# Patient Record
Sex: Male | Born: 1935 | Race: White | Hispanic: No | Marital: Married | Smoking: Never smoker
Health system: Southern US, Community
[De-identification: ages and names within clinical notes are randomized; demographics above are authoritative.]

## PROBLEM LIST (undated history)

## (undated) DIAGNOSIS — M419 Scoliosis, unspecified: Secondary | ICD-10-CM

## (undated) DIAGNOSIS — J449 Chronic obstructive pulmonary disease, unspecified: Secondary | ICD-10-CM

## (undated) DIAGNOSIS — C4490 Unspecified malignant neoplasm of skin, unspecified: Secondary | ICD-10-CM

## (undated) DIAGNOSIS — E039 Hypothyroidism, unspecified: Secondary | ICD-10-CM

## (undated) DIAGNOSIS — Z8719 Personal history of other diseases of the digestive system: Secondary | ICD-10-CM

## (undated) DIAGNOSIS — K5730 Diverticulosis of large intestine without perforation or abscess without bleeding: Secondary | ICD-10-CM

## (undated) DIAGNOSIS — R7302 Impaired glucose tolerance (oral): Secondary | ICD-10-CM

## (undated) DIAGNOSIS — Z9889 Other specified postprocedural states: Secondary | ICD-10-CM

## (undated) DIAGNOSIS — R972 Elevated prostate specific antigen [PSA]: Secondary | ICD-10-CM

## (undated) DIAGNOSIS — Z9049 Acquired absence of other specified parts of digestive tract: Secondary | ICD-10-CM

## (undated) HISTORY — DX: Unspecified malignant neoplasm of skin, unspecified: C44.90

## (undated) HISTORY — DX: Acquired absence of other specified parts of digestive tract: Z90.49

## (undated) HISTORY — PX: COLON RESECTION: SHX5231

## (undated) HISTORY — DX: Diverticulosis of large intestine without perforation or abscess without bleeding: K57.30

## (undated) HISTORY — DX: Scoliosis, unspecified: M41.9

## (undated) HISTORY — PX: SHOULDER SURGERY: SHX246

## (undated) HISTORY — DX: Impaired glucose tolerance (oral): R73.02

## (undated) HISTORY — DX: Chronic obstructive pulmonary disease, unspecified: J44.9

## (undated) HISTORY — DX: Other specified postprocedural states: Z98.890

## (undated) HISTORY — DX: Personal history of other diseases of the digestive system: Z87.19

## (undated) HISTORY — DX: Elevated prostate specific antigen (PSA): R97.20

---
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
## 2001-02-06 ENCOUNTER — Ambulatory Visit (HOSPITAL_COMMUNITY): Admission: RE | Admit: 2001-02-06 | Discharge: 2001-02-06 | Payer: Self-pay | Admitting: Gastroenterology

## 2001-04-19 ENCOUNTER — Encounter: Admission: RE | Admit: 2001-04-19 | Discharge: 2001-04-19 | Payer: Self-pay | Admitting: Orthopedic Surgery

## 2001-04-19 ENCOUNTER — Encounter: Payer: Self-pay | Admitting: Orthopedic Surgery

## 2001-04-24 ENCOUNTER — Ambulatory Visit (HOSPITAL_BASED_OUTPATIENT_CLINIC_OR_DEPARTMENT_OTHER): Admission: RE | Admit: 2001-04-24 | Discharge: 2001-04-25 | Payer: Self-pay | Admitting: Orthopedic Surgery

## 2005-01-04 ENCOUNTER — Encounter: Admission: RE | Admit: 2005-01-04 | Discharge: 2005-01-04 | Payer: Self-pay | Admitting: Geriatric Medicine

## 2005-01-18 ENCOUNTER — Encounter: Admission: RE | Admit: 2005-01-18 | Discharge: 2005-01-18 | Payer: Self-pay | Admitting: Surgery

## 2007-09-06 ENCOUNTER — Encounter (INDEPENDENT_AMBULATORY_CARE_PROVIDER_SITE_OTHER): Payer: Self-pay | Admitting: General Surgery

## 2007-09-06 ENCOUNTER — Inpatient Hospital Stay (HOSPITAL_COMMUNITY): Admission: EM | Admit: 2007-09-06 | Discharge: 2007-09-13 | Payer: Self-pay | Admitting: Emergency Medicine

## 2010-08-26 ENCOUNTER — Other Ambulatory Visit: Payer: Self-pay | Admitting: Dermatology

## 2010-11-02 NOTE — H&P (Signed)
NAME:  BRAXTYN, BOJARSKI NO.:  [id number]   MEDICAL RECORD NO.:  [id number]          PATIENT TYPE:  EMS   LOCATION:  MAJO                         FACILITY:  [facility]   PHYSICIAN:  Lennie Muckle, MD      DATE OF BIRTH:  September 01, 1925   DATE OF ADMISSION:  09/06/2007  DATE OF DISCHARGE:                              HISTORY & PHYSICAL   ADMITTING PHYSICIAN:  Lennie Muckle, M.D.   CHIEF COMPLAINT:  Lower abdominal pain.   HISTORY OF PRESENT ILLNESS:  Mr. Ketcham is a 77 year old relatively  healthy male patient.  Only significant medical problems recently have  been over the past __________ months he has been treated for recurrent  skin cancers, he described as being squamous cell, and has had several  excisions for this.  He also has a history of right inguinal hernia  repair and appendectomy.  He developed abrupt onset of lower abdominal  pain around 4:00 a.m. this morning, which awakened him from sleep.  His  pain was very severe, was later associated with persistent nausea and  vomiting and lower abdominal distention.  He had not passed a BM or  flatus since the onset of the symptoms.  Last BM or flatus was yesterday  prior to going to bed.  He finally presented to the ER, where workup  revealed a normal white count but a left shift.  Other electrolytes were  relatively within normal limits.  A CT of the abdomen and pelvis with  oral and IV contrast revealed a closed loop small bowel obstruction,  possibly some internal hernia, although this was not easily identified.  This could also be related to an adhesion.  Dr. Ethelda Chick requested  surgical evaluation for admission.   REVIEW OF SYSTEMS:  As per the history of present illness.  The patient  denies issues with chronic abdominal pain, nausea, vomiting, bloating.  No upper or lower bleeding symptoms.  No dark stools.  No difficulty  with passing stools.  No hemorrhoids or other issues.  Otherwise review  of systems  is noncontributory.   PAST MEDICAL HISTORY:  Skin cancer and constipation.   PAST SURGICAL HISTORY:  Excision of skin cancers x4, mainly face and leg  region, colonoscopy in 2002, right inguinal hernia repair, appendectomy  60 years ago and rotator cuff repair on the right.   FAMILY HISTORY:  Noncontributory.   SOCIAL HISTORY:  He is married.  His wife is at the bedside with him.  No alcohol.  No tobacco.   DRUG ALLERGIES:  NKDA.   CURRENT MEDICATIONS:  Milk of Magnesia 30 mL at hour of sleep to aid  daily BM.   PHYSICAL EXAMINATION:  GENERAL:  Pleasant but acutely ill-appearing male  complaining of diffuse lower abdominal pain, lying on his right side for  comfort reasons.  VITAL SIGNS:  Temperature 97.5, BP 148/92, pulse 60, respirations 18.  PSYCH:  The patient is alert and oriented x3.  His affect is appropriate  for current situation.  NEURO:  The patient is moving all extremities x4.  Cranial nerves II-XII  are grossly  intact.  No obvious focal neurological deficits.  Sensation  is grossly intact in the upper and lower extremities.  HEENT:  Sclerae  are noninjected, nonicteric.  Conjunctivae pink.  Pupils are equal and  reactive to light.  Ears, nose and throat:  Oral mucous membranes are  dry.  Ears are symmetrical in appearance.  No obvious lesions noted.  Posterior pharynx without exudate.  NECK:  Supple.  No appreciable adenopathy.  Thyroid down,  nonpalpable.  Trachea midline.  CHEST:  Bilateral lung sounds are clear to auscultation anteriorly.  Respiratory effort is nonlabored.  He is on room air satting 98%.  CARDIAC:  Heart sounds are S1 and S2.  No obvious rubs, murmurs, thrills  or gallops.  Not tachycardiac.  No JVD.  No peripheral edema.  No  carotid bruits were auscultated.  Pulses:  Carotid, radial and pedal  were 2+ bilaterally.  ABDOMEN:  Soft.  Bowel sounds are absent.  He is markedly distended in  the lower abdomen and diffusely tender in the same  region.  He also  complains of tenderness when you rotate him to either side in the upper  quadrant, but the majority of the pain is in the lower quadrants.  There  is no definite single focal area of pain in the abdomen and the lower  area is tender without guarding or rebounding.  No hernias were noted on  the abdominal wall or in the inguinal areas from prior surgical  excision.  The right lower quadrant incision is intact, well-healed and  no hernia there as well.  No obvious masses, no obvious  hepatosplenomegaly.  No bruits were noted.  EXTREMITIES:  Symmetrical in  appearance without cyanosis or clubbing.  SKIN:  Multiple seborrheic keratoses, age appropriate.  He does have a  linear vertical scar at the right nasolabial fold just above the lip,  well-healed.   LABORATORIES:  Sodium 138, potassium 3.8, CO2 25, glucose 139, BUN 17,  creatinine 1.10, lipase 14.  White count 9,300, neutrophils 83%,  hemoglobin 16.5, platelets 139,000.  Diagnostic CT of the abdomen and  pelvis as noted in history present illness.  I have also reviewed the CT  with Dr. Deanne Coffer in radiology.  As noted, there is a closed loop small  bowel obstruction.  No definite etiology.  It could be an internal loop  versus adhesion from prior appendectomy procedure.  He also had some  unusual thickening of the small bowel wall and significant mesenteric  edema.  This could be a sign of early ischemia.   IMPRESSION:  1. Closed loop small bowel obstruction in the right abdomen.      Uncertain etiology.  Internal hernia versus adhesion versus other.  2. Polycythemia secondary to hypovolemia/dehydration.   PLAN:  Admit the patient to general floor for bowel rest, NG tube and  n.p.o. status.  IV fluids.  The patient will receive a dose of Unasyn in  the ER.  Need to review the CT and the patient's case with Dr. Freida Busman.  Uncertain if we need to proceed with bowel rest to see if symptoms  improve and follow with x-rays  and follow clinically versus go ahead and  take the patient to the OR this afternoon.      Allison L. Kennith Center, MD  Electronically Signed    ALE/MEDQ  D:  09/06/2007  T:  09/06/2007  Job:  [id number]   cc:   Aram Beecham  Lucilla Lame, M.D.

## 2010-11-02 NOTE — Discharge Summary (Signed)
NAME:  Jason Barton, Jason Barton NO.:  [id number]   MEDICAL RECORD NO.:  [id number]          PATIENT TYPE:  INP   LOCATION:  5150                         FACILITY:  [facility]   PHYSICIAN:  Currie Paris, M.D.DATE OF BIRTH:  1926/01/10   DATE OF ADMISSION:  09/06/2007  DATE OF DISCHARGE:  09/13/2007                               DISCHARGE SUMMARY   ADMITTING PHYSICIAN:  Amber L. Freida Busman, MD.   PRIMARY CARE PHYSICIAN:  Stacie Acres. White, MD.   CHIEF COMPLAINT/REASON FOR ADMISSION:  Jason Barton is a 77 year old  male patient, only other past medical history has been removal of  several squamous cell carcinomas.  He also has had a prior surgical  history of right inguinal hernia repair and an appendectomy.  He  developed abrupt onset of lower abdominal pain, around 4 a.m. in the  morning of admission, this awakened him from sleep, the pain was very  severe.  Later, the patient developed persistent nausea, vomiting, and  lower abdominal distention.  No BM or flatus since the onset of  symptoms.  The patient presented to the ER where he was evaluated by Dr.  Ethelda Chick, and a CT scan was done that revealed a closed loop small  bowel obstruction, either an internal hernia or possibly adhesions and a  small bowel wall thickening and edema consistent with possible early  ischemia.   On exam, the patient did have a distended abdomen mainly in the lower  abdomen, remarkably distended and diffusely tender and a pain with  movement, especially in the lower quadrant.  No bowel sounds were  auscultated.  No hernias.  The patient's white count was 9300 with a  left shift and neutrophils 83%.  The patient's hemoglobin was somewhat  elevated at 16.5 consistent with probable volume depletion and his  platelets were 139,000.  CT of the abdomen and pelvis was reviewed with  Dr. Deanne Coffer, who is a radiologist, and it was agreed that the patient  had a closed small bowel loop obstruction  certain, etiology unknown, but  definite findings consistent with possible evolving ischemic problems,  focally at the level of the obstruction.   The patient was admitted with the following diagnoses.  1. Closed loop small bowel obstruction, right abdomen, etiology      uncertain, associated ischemic changes on CT.  2. Polycythemia due to hypervolemia, dehydration, and recent  nausea      and vomiting.  3. History of prior abdominal surgery as mentioned.  4. History of prior squamous cell carcinoma removal.   HOSPITAL COURSE:  The patient was admitted and taken directly from the  ER to the operating room by Dr. Freida Busman where he underwent exploratory  laparotomy, where he was found to have small bowel ischemia and a band  like intestinal adhesion.  He underwent the laparotomy for the small  bowel resection and lysis of adhesions, 60 cm of his distal ileum was  necrotic and removed, and primary anastomosis was achieved.  Please  refer to Dr. Eliot Ford operative note for details.  The patient was quite  stable during this procedure, returned subsequently  to PACU, and then to  the surgical floor for the remainder of the hospitalization.   The patient had a normal hospital course.  He did have problems with the  postoperative ileus, so the NG tube was left in place up until  postoperative day #5, at which he was started on a diet and advanced up  to full liquids until postoperative day 6.  He had not had a BM yet but  had tolerated diet without nausea and vomiting.  Dulcolax suppository  was given as well as a dose of MiraLax, and the patient had a massive BM  with marked improvement in his symptoms, and was subsequently started on  a solid diet on postoperative day 6.  On postoperative day 7, the  patient was otherwise deemed appropriate for discharge to home.  He was  tolerating a solid diet.  His incisional wound was well approximated.  He did have some redness at the upper portion of the  incision without  any induration or drainage, and plans were to remove the staples and  apply Steri-Strips prior to discharge.  Pain was adequately controlled  with oral medications.   The patient reports he has a history of unusual dermatological eruptions  in the past involving very large splotchy, circular breakouts as well as  maculopapular rashes.  He had first episodes of these in the  postoperative period.  He had an episode of splotchy breakout on the  anterior chest, neck, and a very large area on the left shoulder that  appeared more consistent with a burn, although the patient had not had  any treatment performed to him during the hospitalization that would  result in a burn.  The area on the left shoulder actually did erupt in  several vesicles, with the most large vesicle rupturing spontaneously  and remaining dry.  In addition, the patient developed a diffuse  maculopapular rash over his entire back with significant pruritus.  After taking a bath, this rash seemed to extend to his abdomen and legs  that were subsequently cleared.  He was given a solitary dose of Solu-  Medrol IV and started on Benadryl p.o. and later started on a topical  cortisone cream to the back for symptoms.  The patient reports since he  has had these in the past and he had been followed by Dr. Nicholas Lose, he  plans on following up with her after discharge.  I was uncertain if  Unasyn versus physiological stress brought about these conditions, but  the Unasyn was discontinued.   FINAL DISCHARGE DIAGNOSES:  1. Small bowel obstruction with ischemic small bowel secondary to band      adhesions.  2. Status post exploratory laparotomy, lysis of adhesion, and small      bowel resection, 60-cm distal ileum with primary anastomosis.  3. Postop ileus, resolved.  4. Rashes as described, recurrent.  5. History of constipation, on milk of magnesia at home.   DISCHARGE MEDICATIONS:  1. The patient was taking milk  of magnesia at home nightly to help      with aid in BMs.  We have stopped this and instead we have      recommended the patient begin Colace 100 mg b.i.d. as well as      MiraLax 17 g in 8 ounces of fluid daily at bedtime.  The patient      has been instructed on how to adjust the dosage on the MiraLax  based on frequency and consistency of stools.  2. Discontinue the cortisone cream as previously directed here at the      hospital.  3. Vicodin 5/325, one to two tabs every 4 hours as needed for pain.  4. Over-the-counter Ibuprofen, 200-mg strength, 1-2 tablets every 8      hours as needed for pain if not controlled by the Vicodin.   RETURN TO WORK:  Not applicable.   DIET:  No restrictions.   WOUND CARE:  Allow Steri-Strips to fall off.   ACTIVITY:  Increase activity slowly, and may walk  up steps, may shower.  No lifting for the next 5 weeks more than 15 pounds.  No driving for 2  weeks or until he follows with Dr. Freida Busman.   FOLLOWUP APPOINTMENT:  He needs to call Dr. Freida Busman at [phone] to be seen  in 2 weeks.  He needs to see a primary care physician, Dr. Cliffton Asters, as  well as Dr. Nicholas Lose regarding the rash.  He needs to call to arrange an  appointment within the next 1-4 weeks.   OTHER INSTRUCTIONS:  He needs to call the surgeon if,  A.  Fever greater than 101 degrees Fahrenheit.  B.  New or increased pelvic pain.  C.  Nausea, vomiting, diarrhea, or inability to have bowel movement.  D.  Redness or drainage from the wound.      Allison L. Rennis Harding, N.P.      Currie Paris, M.D.  Electronically Signed    ALE/MEDQ  D:  09/13/2007  T:  09/14/2007  Job:  [id number]   cc:   Lennie Muckle, MD  Stacie Acres. Cliffton Asters, M.D.  Venancio Poisson, M.D.

## 2010-11-02 NOTE — Op Note (Signed)
NAME:  MANLY, NESTLE NO.:  [id number]   MEDICAL RECORD NO.:  [id number]          PATIENT TYPE:  INP   LOCATION:  5120                         FACILITY:  [facility]   PHYSICIAN:  Lennie Muckle, MD      DATE OF BIRTH:  February 28, 1926   DATE OF PROCEDURE:  09/06/2007  DATE OF DISCHARGE:                               OPERATIVE REPORT   PREOPERATIVE DIAGNOSIS:  Internal hernia with intestinal ischemia.   POSTOPERATIVE DIAGNOSIS:  1. Small bowel ischemia.  2. Intestinal adhesion.   PROCEDURE:  Exploratory laparotomy, small bowel resection, and lysis of  adhesions.   SURGEON:  Lennie Muckle, M.D.   ASSISTANT:  Sandria Bales. Ezzard Standing, M.D.   ANESTHESIA:  General endotracheal anesthesia.   FINDINGS:  60 cm segment of distal ileum with necrotic.   SPECIMENS:  Distal ileum to pathology.   ESTIMATED BLOOD LOSS:  Minimal.   COMPLICATIONS:  No immediate complications.   DRAINS:  No drains were placed.   DISPOSITION:  The patient was extubated and transferred to the post  anesthesia care unit in stable condition.   INDICATIONS FOR PROCEDURE:  Mr. Weckerly is a 77 year old male who had  presented to the emergency department earlier today with the onset of  abdominal pain beginning in the morning.  The pain was severe, he had  associated nausea.  A CT scan revealed small bowel thickening and  mesenteric ischemia.  A large amount of fluid.  There was a concern for  intestinal ischemia.  It was decided to take him to the operating room  for exploration.   PROCEDURE IN DETAIL:  Mr. Kluger was identified in the preoperative  holding suite.  He received Unasyn in the holding unit.  He was then  taken to the operating room and placed in a supine position.  His  abdomen was prepped and draped in the usual sterile fashion.  A time out  procedure indicating the patient and procedure were performed.  A  midline incision was then placed with the scalpel.  The subcutaneous  tissues  were divided with electrocautery.  The abdomen was entered  without difficulty.  There was a large amount of serosanguineous fluid.  Immediately upon entering, there was darkened small bowel.  We were able  to free up the intestines.  The distal ileum was identified.  At first,  there did not seem to be an adhesion in the right lower quadrant.  We  continued to follow the bowel proximally into the right upper quadrant.  There was a small piece of omentum adhesion adhered across the small  bowel.  This was bluntly dissected and the small bowel was freed.  The  intestine was examined from the ligament of Treitz to the distal ileum.  There was approximately a 60 cm segment which appeared non-viable.  Some  of the distal ileum did perk up with watchful waiting.  However, this 60  cm segment was not viable again.  A place was chosen which did appear to  be viable.  The mesentery was dissected with the LigaSure device.  We  then  performed a side-to-side anastomosis with a 55 GIA stapler.  The  enterotomy was stapled across with a GIA stapler.  The mesenteric defect  was closed with a running silk suture.  The colon was inspected and  appeared healthy in appearance.  There were no other abnormalities  noted.  The specimen was passed off the operative field.  The abdomen  was irrigated with 2 liters of saline.  Upon final inspection, there was  no evidence of bleeding in the abdominal cavity.  The fascia was closed  with a running PDS suture.  The skin was stapled closed.  A dry gauze  was placed around the dressing.  The patient was extubated and  transported to the post anesthesia care unit in stable condition.      Lennie Muckle, MD  Electronically Signed     ALA/MEDQ  D:  09/06/2007  T:  09/06/2007  Job:  [id number]

## 2010-11-05 NOTE — Procedures (Signed)
[place]  Patient:    Jason Barton, Jason Barton Visit Number: [id number] MRN: [id number]          Service Type: Attending:  Verlin Grills, M.D. Proc. Date: 02/06/01   CC:         Hal T. Stoneking, M.D.   Procedure Report  REFERRING PHYSICIAN:  Hal T. Stoneking, M.D.  PROCEDURE:  Surveillance colonoscopy.  PROCEDURE INDICATION:  [name] (date of birth 21-Dec-1925) is a 77 year old male who is referred for his first screening colonoscopy with polypectomy to prevent colon cancer.  He has a history of colonic diverticulosis discovered by flexible proctosigmoidoscopy.  He has constipation unassociated with gastrointestinal bleeding.  ENDOSCOPIST:  Verlin Grills, M.D.  PREMEDICATION:  Versed 7.5 mg, fentanyl 50 mcg.  ENDOSCOPE:  Olympus pediatric colonoscope.  DESCRIPTION OF PROCEDURE:  After obtaining informed consent, [name] was placed in the left lateral decubitus position.  I administered intravenous fentanyl and intravenous Versed to achieve conscious sedation for the procedure.  The patients blood pressure, oxygen saturation, and cardiac rhythm were monitored throughout the procedure and documented in the medical record.  Anal inspection was normal.  Digital rectal exam revealed a non-nodular prostate.  The Olympus pediatric video colonoscope was introduced into the rectum and easily advanced to the cecum.  Colonic preparation for the exam today was excellent.  Rectum normal.  Sigmoid colon and descending colon:  Extensive left colonic diverticulosis.  Splenic flexure normal.  Transverse colon normal.  Hepatic flexure normal.  Ascending colon normal.  Cecum and ileocecal valve normal.  ASSESSMENT:  Extensive left colonic diverticulosis; otherwise normal proctocolonoscopy to the cecum.  No endoscopic evidence for the presence of colorectal neoplasia. Attending:  Verlin Grills,  M.D. DD:  02/06/01 TD:  02/06/01 Job: [id number]

## 2010-11-05 NOTE — Op Note (Signed)
[place]. [facility]  Patient:    MELTON, WALLS Visit Number: [id number] MRN: [id number]          Service Type: DSU Location: [facility] Attending Physician:  Susa Day Dictated by:   Katy Fitch Naaman Plummer., M.D. Proc. Date: 04/24/01 Admit Date:  04/24/2001   CC:         Hal T. Stoneking, M.D.   Operative Report  PREOPERATIVE DIAGNOSES: 1. Chronic stage 3 impingement, right shoulder, with MRI evidence of    full-thickness rotator cuff tear involving supraspinatus. 2. 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 3. Significant tendinopathy of rotator cuff.  POSTOPERATIVE DIAGNOSIS:  Chronic stage 3 impingement, right shoulder, with retracted rotator cuff tears of supraspinatus, infraspinatus documented arthroscopically.  PROCEDURES: 1. Diagnostic arthroscopy, right glenohumeral joint, and subacromial    bursoscopy. 2. Open resection of distal clavicle. 3. Reconstruction of chronic tear of supraspinatus and infraspinatus tendons    with incidental acromioplasty and lowering of profile of greater    tuberosity. SURGEON:  Katy Fitch. Sypher, Montez Hageman., M.D.  ASSISTANT:  Jonni Sanger, P.A.  ANESTHESIA:  General orotracheal supplemented by interscalene block supervised by the anesthesiologist, Cliffton Asters. Ivin Booty, M.D.  INDICATION:  Jason Barton is a 77 year old man referred by Hal T. Stoneking, M.D., for evaluation and management of a painful right shoulder. He had a history of chronic impingement, nighttime pain, and weakness of abduction and external rotation.  Clinical examination documented stage 3 impingement with x-rays revealing impinging osteophytes on the greater tuberosity of the humerus and the lateral anterior acromion as well as AC joint arthroscopy.  An MRI of the shoulder documented a full-thickness rotator cuff tear with some degree of retraction.  We recommended proceeding to  diagnostic arthroscopy for confirmation of the rotator cuff predicament, followed by appropriate reconstruction.  DESCRIPTION OF PROCEDURE:  Cem Kosman was brought to the operating room and placed in the supine position upon the operating table.  Following interscalene block in the holding area by Dr. Ivin Booty, anesthesia was satisfactory in the right forequarter.  Following the induction of general orotracheal anesthesia, he was carefully positioned in the beach chair position with the aid of a torso and head holder designed for shoulder arthroscopy.  The entire right upper extremity and forequarter were prepped with Duraprep and draped with impervious arthroscopy drapes.  Ancef 1 g was administered as an IV prophylactic antibiotic.  The procedure commenced with introduction of the arthroscope through a standard posterior portal.  Diagnostic arthroscopy revealed intact hyaline articular cartilage surfaces on the entire humeral head and glenoid.  There was minimal fray of the anterior labrum.  There was a small degree of synovitis noted anteriorly adjacent to the rotator interval.  The long head of the biceps had a normal anchor on the superior labrum and was pristine.  The subscapularis was normal.  There was no sign of adhesive capsulitis.  The deep surface of the supraspinatus was significantly degenerative, shredded, and retracted.  The anterior portion of the infraspinatus was likewise degenerative and retracted.  The teres minor had a normal insertion.  The scope was then used to document the condition of the joint with digital photography, followed by removal of the scope from the glenohumeral joint and placement of the scope in the subacromial space.  The coracoacromial ligament was visualized and the [facility] joint noted to have an intact capsule.  The [facility] joint was, however, prominent.  The scope was then  removed from the bursa, and we proceeded directly to an open rotator  cuff repair and distal clavicle resection.  An 8 cm incision was fashioned from the distal 2 cm of the clavicle to the anterior middle third of the deltoid.  This was extended through the subcutaneous tissues to the fascia of the deltoid.  The cutting cautery was used to open the capsule of the [facility] joint and a Key elevator used to expose the distal 2 cm of the distal clavicle.  An oscillating saw was used to remove a rectangular piece of bone measuring 18 mm in width, removing the distal clavicle and its arthritic surface.  The meniscus was degenerative, and the hyaline articular cartilage on the distal clavicle was quite degenerative.  This was passed off as a pathologic specimen.  The medial spurs at the acromial side of the [facility] joint were resected with a rongeur, followed by takedown of the anterior third of the deltoid. Bursectomy was performed to reveal the cuff tear.  The margins of the cuff tear were freshened with a 15 scalpel blade and a large impinging osteophyte at the greater tuberosity identified.  The profile of the tuberosity was lowered approximately 4 mm by use of an oscillating saw and a power bur, creating a bony trough measuring 3 cm in width and 18 mm in height.  Two bioabsorbable corkscrew anchors were placed with #2 Ethibond suture.  The rotator cuff was then gathered with a mattress suture of #2 Kevlar and advanced to an anatomic position.  Redundant tendon was dissected with a scalpel.  The cuff repair was then inset to the cancellous bed with a total of four mattress sutures, repairing it anatomically to the decorticated portion of the greater tuberosity, and the Kevlar sutures were placed through bone in the manner of McLaughlin, tied on the lateral cortex, securing a very stable repair.  The free margin of the repair was finished with a 3-0 Vicryl suture.  There was noted to be no sign of residual impingement following decompression.  The subacromial  space was then thoroughly lavaged with sterile saline,  followed by repair of the dead space created by distal clavicle resection with mattress sutures of #2 Kevlar, followed by repair of the anterior third of the deltoid to the acromion with thorugh-bone sutures of #2 Kevlar.  The deltoid split was repaired with mattress sutures of 0 Vicryl.  The skin was repaired with subdermal sutures of 3-0 Vicryl and intradermal 3-0 Prolene with Steri-Strips.  Mr. Boyajian tolerated the surgery and anesthesia well.  He was transferred to the recovery room with stable vital signs.  He will be discharged to the recovery care center for observation of his vital signs, appropriate analgesic in the form of IV PCA morphine and IV Dilaudid as well as prophylactic antibiotics in the form of Ancef 1 g IV x 3 doses over 24 hours.  He will be discharged in the morning of April 25, 2001, to home under the care of his wife and will return to our office in 48 hours to initiate the exercise program. Dictated by:   Katy Fitch. Naaman Plummer., M.D. Attending Physician:  Susa Day DD:  04/24/01 TD:  04/25/01 Job: [phone] [id number]

## 2011-01-05 ENCOUNTER — Other Ambulatory Visit: Payer: Self-pay | Admitting: Dermatology

## 2011-03-14 LAB — DIFFERENTIAL
Basophils Absolute: 0
Basophils Relative: 0
Basophils Relative: 0
Eosinophils Absolute: 0
Eosinophils Absolute: 0
Eosinophils Absolute: 0.1
Eosinophils Relative: 3
Lymphocytes Relative: 20
Lymphs Abs: 1
Monocytes Absolute: 0.4
Monocytes Relative: 10
Monocytes Relative: 5
Neutro Abs: 11.7 — ABNORMAL HIGH
Neutrophils Relative %: 67
Neutrophils Relative %: 85 — ABNORMAL HIGH

## 2011-03-14 LAB — COMPREHENSIVE METABOLIC PANEL
ALT: 23
AST: 29
Alkaline Phosphatase: 54
BUN: 15
CO2: 25
CO2: 26
Calcium: 8.9
GFR calc Af Amer: >60
GFR calc non Af Amer: >60
Glucose, Bld: 218 — ABNORMAL HIGH
Potassium: 4.8
Sodium: 138
Total Bilirubin: 0.9
Total Protein: 5.1 — ABNORMAL LOW
Total Protein: 6.4

## 2011-03-14 LAB — CBC
HCT: 37.6 — ABNORMAL LOW
HCT: 49.1
Hemoglobin: 16.5
MCHC: 34
MCV: 90.3
Platelets: 118 — ABNORMAL LOW
RBC: 4.16 — ABNORMAL LOW
RBC: 5.39
RBC: 5.43
RDW: 13.8
RDW: 13.9
WBC: 4.9

## 2011-03-14 LAB — BASIC METABOLIC PANEL
CO2: 23
Calcium: 8 — ABNORMAL LOW
Chloride: 107
GFR calc Af Amer: >60
GFR calc Af Amer: >60
GFR calc non Af Amer: >60
GFR calc non Af Amer: >60
GFR calc non Af Amer: >60
Glucose, Bld: 280 — ABNORMAL HIGH
Potassium: 3.5
Potassium: 3.7
Potassium: 5.3 — ABNORMAL HIGH
Sodium: 135
Sodium: 138

## 2011-03-14 LAB — URINALYSIS, ROUTINE W REFLEX MICROSCOPIC
Bilirubin Urine: NEGATIVE
Hgb urine dipstick: NEGATIVE
Nitrite: NEGATIVE
Specific Gravity, Urine: 1.027
pH: 6

## 2011-05-21 ENCOUNTER — Encounter: Payer: Self-pay | Admitting: Internal Medicine

## 2011-05-21 DIAGNOSIS — Z9049 Acquired absence of other specified parts of digestive tract: Secondary | ICD-10-CM | POA: Insufficient documentation

## 2011-05-21 DIAGNOSIS — M419 Scoliosis, unspecified: Secondary | ICD-10-CM

## 2011-05-21 DIAGNOSIS — J449 Chronic obstructive pulmonary disease, unspecified: Secondary | ICD-10-CM | POA: Insufficient documentation

## 2011-05-21 DIAGNOSIS — K5730 Diverticulosis of large intestine without perforation or abscess without bleeding: Secondary | ICD-10-CM

## 2011-05-21 DIAGNOSIS — Z8719 Personal history of other diseases of the digestive system: Secondary | ICD-10-CM | POA: Insufficient documentation

## 2011-05-21 DIAGNOSIS — C4490 Unspecified malignant neoplasm of skin, unspecified: Secondary | ICD-10-CM

## 2011-05-21 DIAGNOSIS — Z9889 Other specified postprocedural states: Secondary | ICD-10-CM | POA: Insufficient documentation

## 2011-05-21 DIAGNOSIS — Z0000 Encounter for general adult medical examination without abnormal findings: Secondary | ICD-10-CM | POA: Insufficient documentation

## 2011-05-21 HISTORY — DX: Chronic obstructive pulmonary disease, unspecified: J44.9

## 2011-05-21 HISTORY — DX: Acquired absence of other specified parts of digestive tract: Z90.49

## 2011-05-21 HISTORY — DX: Diverticulosis of large intestine without perforation or abscess without bleeding: K57.30

## 2011-05-21 HISTORY — DX: Personal history of other diseases of the digestive system: Z87.19

## 2011-05-21 HISTORY — DX: Scoliosis, unspecified: M41.9

## 2011-05-21 HISTORY — DX: Unspecified malignant neoplasm of skin, unspecified: C44.90

## 2011-05-24 ENCOUNTER — Ambulatory Visit (INDEPENDENT_AMBULATORY_CARE_PROVIDER_SITE_OTHER): Payer: Medicare Other | Admitting: Internal Medicine

## 2011-05-24 ENCOUNTER — Other Ambulatory Visit (INDEPENDENT_AMBULATORY_CARE_PROVIDER_SITE_OTHER): Payer: Medicare Other

## 2011-05-24 ENCOUNTER — Encounter: Payer: Self-pay | Admitting: Internal Medicine

## 2011-05-24 VITALS — BP 128/82 | HR 53 | Temp 98.2°F | Ht 68.0 in | Wt 136.0 lb

## 2011-05-24 DIAGNOSIS — E785 Hyperlipidemia, unspecified: Secondary | ICD-10-CM | POA: Insufficient documentation

## 2011-05-24 DIAGNOSIS — J449 Chronic obstructive pulmonary disease, unspecified: Secondary | ICD-10-CM

## 2011-05-24 DIAGNOSIS — R5381 Other malaise: Secondary | ICD-10-CM

## 2011-05-24 DIAGNOSIS — R5383 Other fatigue: Secondary | ICD-10-CM

## 2011-05-24 DIAGNOSIS — R972 Elevated prostate specific antigen [PSA]: Secondary | ICD-10-CM

## 2011-05-24 DIAGNOSIS — R634 Abnormal weight loss: Secondary | ICD-10-CM

## 2011-05-24 HISTORY — DX: Elevated prostate specific antigen (PSA): R97.20

## 2011-05-24 LAB — BASIC METABOLIC PANEL
BUN: 22 mg/dL (ref 6–23)
CO2: 31 mEq/L (ref 19–32)
GFR: 74.12 mL/min (ref >60.00)
Glucose, Bld: 96 mg/dL (ref 70–99)
Potassium: 4.5 mEq/L (ref 3.5–5.1)
Sodium: 143 mEq/L (ref 135–145)

## 2011-05-24 LAB — CBC WITH DIFFERENTIAL/PLATELET
Basophils Absolute: 0 10*3/uL (ref 0.0–0.1)
Eosinophils Relative: 0.8 % (ref 0.0–5.0)
Lymphocytes Relative: 22.3 % (ref 12.0–46.0)
Monocytes Relative: 8.8 % (ref 3.0–12.0)
Neutrophils Relative %: 67.8 % (ref 43.0–77.0)
Platelets: 130 10*3/uL — ABNORMAL LOW (ref 150.0–400.0)
RDW: 14.2 % (ref 11.5–14.6)
WBC: 5.8 10*3/uL (ref 4.5–10.5)

## 2011-05-24 LAB — HEPATIC FUNCTION PANEL
AST: 27 U/L (ref 0–37)
Albumin: 4.3 g/dL (ref 3.5–5.2)
Alkaline Phosphatase: 64 U/L (ref 39–117)
Total Protein: 6.5 g/dL (ref 6.0–8.3)

## 2011-05-24 LAB — URINALYSIS, ROUTINE W REFLEX MICROSCOPIC
Ketones, ur: NEGATIVE
Leukocytes, UA: NEGATIVE
Nitrite: NEGATIVE
Specific Gravity, Urine: 1.02 (ref 1.000–1.030)
Urobilinogen, UA: 0.2 (ref 0.0–1.0)
pH: 7 (ref 5.0–8.0)

## 2011-05-24 LAB — TSH: TSH: 4.08 u[IU]/mL (ref 0.35–5.50)

## 2011-05-24 LAB — LIPID PANEL
LDL Cholesterol: 98 mg/dL (ref 0–99)
Total CHOL/HDL Ratio: 2
Triglycerides: 57 mg/dL (ref 0.0–149.0)

## 2011-05-24 NOTE — Assessment & Plan Note (Signed)
?   Geriatric declines vs other  - also for cxr today

## 2011-05-24 NOTE — Progress Notes (Signed)
  Subjective:    Patient ID: Jason Barton, male    DOB: 30-Nov-1925, 77 y.o.   MRN: [id number]  HPI  Here as new pt to establish;  Pt denies chest pain, increased sob or doe, wheezing, orthopnea, PND, increased LE swelling, palpitations, dizziness or syncope.  Pt denies new neurological symptoms such as new headache, or facial or extremity weakness or numbness   Pt denies polydipsia, polyuria Pt states overall good compliance with meds, trying to follow lower cholesterol diet, but wt overall down over 5 lbs in recent time, unintentional, pt quite concerned.  Does have sense of ongoing fatigue, but denies signficant hypersomnolence. No other new complaints Past Medical History  Diagnosis Date  . Nonmelanoma skin cancer 05/21/2011  . S/P inguinal hernia repair 05/21/2011  . S/P appendectomy 05/21/2011  . S/p small bowel obstruction 05/21/2011  . Diverticulosis of colon 05/21/2011  . COPD (chronic obstructive pulmonary disease) 05/21/2011  . Thoracic scoliosis 05/21/2011  . Increased prostate specific antigen (PSA) velocity 05/24/2011   Past Surgical History  Procedure Date  . Appendectomy 1939  . Tonsillectomy 1942  . Shoulder surgery     dr sypher; rotater cuff    reports that he has never smoked. He does not have any smokeless tobacco history on file. He reports that he does not drink alcohol or use illicit drugs. family history includes Arthritis in his other and Sudden death in his other. No Known Allergies No current outpatient prescriptions on file prior to visit.  Sees Dr Tannenbaum/urology for yearly visit Review of Systems Review of Systems  Constitutional: Negative for diaphoresis and unexpected weight change.  HENT: Negative for drooling and tinnitus.   Eyes: Negative for photophobia and visual disturbance.  Respiratory: Negative for choking and stridor.   Gastrointestinal: Negative for vomiting and blood in stool.  Genitourinary: Negative for hematuria and decreased urine  volume.  Musculoskeletal: Negative for gait problem. .      Objective:   Physical Exam BP 128/82  Pulse 53  Temp(Src) 98.2 F (36.8 C) (Oral)  Ht 5\' 8"  (1.727 m)  Wt 136 lb (61.689 kg)  BMI 20.68 kg/m2  SpO2 99% Physical Exam  VS noted Constitutional: Pt appears well-developed and well-nourished.  HENT: Head: Normocephalic.  Right Ear: External ear normal.  Left Ear: External ear normal.  Eyes: Conjunctivae and EOM are normal. Pupils are equal, round, and reactive to light.  Neck: Normal range of motion. Neck supple.  Cardiovascular: Normal rate and regular rhythm.   Pulmonary/Chest: Effort normal and breath sounds normal.  Abd:  Soft, NT, non-distended, + BS Neurological: Pt is alert. No cranial nerve deficit.  Skin: Skin is warm. No erythema.  Psychiatric: Pt behavior is normal. Thought content normal.     Assessment & Plan:

## 2011-05-24 NOTE — Patient Instructions (Addendum)
You had the flu shot today Please consider the shingles shot Please go to LAB in the Basement for the blood and/or urine tests to be done today Please go to XRAY in the Basement for the x-ray test Please call the phone number [phone] (the [employer] System) for results of testing in 2-3 days;  When calling, simply dial the number, and when prompted enter the MRN number above (the Medical Record Number) and the # key, then the message should start. Please keep your appointments with your specialists as you have planned - Dr Patsi Sears Please return in 6 months, or sooner if needed

## 2011-05-25 LAB — TESTOSTERONE, FREE, TOTAL, SHBG: Sex Hormone Binding: 62 nmol/L (ref 13–71)

## 2011-05-29 ENCOUNTER — Encounter: Payer: Self-pay | Admitting: Internal Medicine

## 2011-05-29 NOTE — Assessment & Plan Note (Signed)
stable overall by hx and exam, most recent data reviewed with pt, and pt to continue medical treatment as before  Lab Results  Component Value Date   LDLCALC 98 05/24/2011

## 2011-05-29 NOTE — Assessment & Plan Note (Signed)
Etiology unclear, Exam otherwise benign, to check labs as documented, follow with expectant management  

## 2011-05-29 NOTE — Assessment & Plan Note (Signed)
stable overall by hx and exam, most recent data reviewed with pt, and pt to continue medical treatment as before SpO2 Readings from Last 3 Encounters:  05/24/11 99%

## 2011-05-29 NOTE — Assessment & Plan Note (Signed)
For f/u PSA today per pt reqeust,  to f/u any worsening symptoms or concerns

## 2011-06-02 ENCOUNTER — Telehealth: Payer: Self-pay | Admitting: Internal Medicine

## 2011-06-02 NOTE — Telephone Encounter (Signed)
Received copies from [facility],on 12.13.12 . Forwarded 10  pages to Dr. Jonny Ruiz ,for review. sj

## 2011-07-14 ENCOUNTER — Other Ambulatory Visit: Payer: Self-pay | Admitting: Dermatology

## 2011-07-14 DIAGNOSIS — L821 Other seborrheic keratosis: Secondary | ICD-10-CM | POA: Diagnosis not present

## 2011-07-14 DIAGNOSIS — L570 Actinic keratosis: Secondary | ICD-10-CM | POA: Diagnosis not present

## 2011-08-05 DIAGNOSIS — L570 Actinic keratosis: Secondary | ICD-10-CM | POA: Diagnosis not present

## 2011-09-12 DIAGNOSIS — L570 Actinic keratosis: Secondary | ICD-10-CM | POA: Diagnosis not present

## 2011-09-15 ENCOUNTER — Other Ambulatory Visit: Payer: Self-pay | Admitting: Dermatology

## 2011-09-15 DIAGNOSIS — C44529 Squamous cell carcinoma of skin of other part of trunk: Secondary | ICD-10-CM | POA: Diagnosis not present

## 2011-09-30 ENCOUNTER — Telehealth: Payer: Self-pay

## 2011-09-30 MED ORDER — PROMETHAZINE HCL 25 MG PO TABS: 25.0000 mg | ORAL_TABLET | Freq: Four times a day (QID) | ORAL | Status: DC | PRN

## 2011-09-30 NOTE — Telephone Encounter (Signed)
Patient informed of prescription sent to pharmacy 

## 2011-09-30 NOTE — Telephone Encounter (Signed)
Ok for phenergan prn, also ok for immodium otc prn

## 2011-09-30 NOTE — Telephone Encounter (Signed)
Pt called stating he has had diarrhea x 6 today and has vomited x 2. Pt denies fever, abd pain, or blood in stool. Pt is requesting Rx to treat sxs, please advise.

## 2011-10-12 ENCOUNTER — Other Ambulatory Visit: Payer: Self-pay | Admitting: Dermatology

## 2011-10-12 DIAGNOSIS — Z85820 Personal history of malignant melanoma of skin: Secondary | ICD-10-CM | POA: Diagnosis not present

## 2011-10-12 DIAGNOSIS — L821 Other seborrheic keratosis: Secondary | ICD-10-CM | POA: Diagnosis not present

## 2011-10-12 DIAGNOSIS — D0439 Carcinoma in situ of skin of other parts of face: Secondary | ICD-10-CM | POA: Diagnosis not present

## 2011-10-12 DIAGNOSIS — C44320 Squamous cell carcinoma of skin of unspecified parts of face: Secondary | ICD-10-CM | POA: Diagnosis not present

## 2011-10-12 DIAGNOSIS — Z85828 Personal history of other malignant neoplasm of skin: Secondary | ICD-10-CM | POA: Diagnosis not present

## 2011-10-12 DIAGNOSIS — L570 Actinic keratosis: Secondary | ICD-10-CM | POA: Diagnosis not present

## 2011-11-09 DIAGNOSIS — C44320 Squamous cell carcinoma of skin of unspecified parts of face: Secondary | ICD-10-CM | POA: Diagnosis not present

## 2011-11-09 DIAGNOSIS — L570 Actinic keratosis: Secondary | ICD-10-CM | POA: Diagnosis not present

## 2011-11-23 ENCOUNTER — Ambulatory Visit (INDEPENDENT_AMBULATORY_CARE_PROVIDER_SITE_OTHER): Payer: Medicare Other | Admitting: Internal Medicine

## 2011-11-23 ENCOUNTER — Encounter: Payer: Self-pay | Admitting: Internal Medicine

## 2011-11-23 VITALS — BP 120/80 | HR 57 | Temp 97.4°F | Ht 68.0 in | Wt 139.0 lb

## 2011-11-23 DIAGNOSIS — J449 Chronic obstructive pulmonary disease, unspecified: Secondary | ICD-10-CM

## 2011-11-23 DIAGNOSIS — N529 Male erectile dysfunction, unspecified: Secondary | ICD-10-CM

## 2011-11-23 DIAGNOSIS — N401 Enlarged prostate with lower urinary tract symptoms: Secondary | ICD-10-CM | POA: Diagnosis not present

## 2011-11-23 DIAGNOSIS — R634 Abnormal weight loss: Secondary | ICD-10-CM

## 2011-11-23 NOTE — Patient Instructions (Addendum)
Continue all other medications as before Please call if you decide to try a medication like viagra Please keep your appointments with your specialists as you have planned - Dr Patsi Sears, and remember to take your blood work results I gave you today (which includes the PSA) Please continue your efforts at being more active, low cholesterol diet, and weight control. Please have the pharmacy call with any refills you may need. Please return in 6 months, or sooner if needed

## 2011-11-27 ENCOUNTER — Encounter: Payer: Self-pay | Admitting: Internal Medicine

## 2011-11-27 DIAGNOSIS — N529 Male erectile dysfunction, unspecified: Secondary | ICD-10-CM | POA: Insufficient documentation

## 2011-11-27 NOTE — Assessment & Plan Note (Signed)
Declines testosterone check, or viagra today, to f/u any worsening symptoms or concerns

## 2011-11-27 NOTE — Progress Notes (Signed)
  Subjective:    Patient ID: Jason Barton, male    DOB: [birth year], 78 y.o.   MRN: [id number]  HPI  Here to f/u; overall doing ok, had several recent skin cancers removed,  Denies worsening depressive symptoms, suicidal ideation, or panic.  Pt denies chest pain, increased sob or doe, wheezing, orthopnea, PND, increased LE swelling, palpitations, dizziness or syncope.   Pt denies polydipsia, polyuria.  Has had no further wt loss - infact wt has increased from 136 to 139 since last visit.  Has some mild ED symptoms, declines viagra type med today.   Pt denies fever, wt loss, night sweats, loss of appetite, or other constitutional symptoms  No other acute complaints.  Pt denies new neurological symptoms such as new headache, or facial or extremity weakness or numbness Past Medical History  Diagnosis Date  . Nonmelanoma skin cancer 05/21/2011  . S/P inguinal hernia repair 05/21/2011  . S/P appendectomy 05/21/2011  . S/p small bowel obstruction 05/21/2011  . Diverticulosis of colon 05/21/2011  . COPD (chronic obstructive pulmonary disease) 05/21/2011  . Thoracic scoliosis 05/21/2011  . Increased prostate specific antigen (PSA) velocity 05/24/2011   Past Surgical History  Procedure Date  . Appendectomy 1939  . Tonsillectomy 1942  . Shoulder surgery     dr sypher; rotater cuff    reports that he has never smoked. He does not have any smokeless tobacco history on file. He reports that he does not drink alcohol or use illicit drugs. family history includes Arthritis in his other and Sudden death in his other. No Known Allergies Current Outpatient Prescriptions on File Prior to Visit  Medication Sig Dispense Refill  . Cholecalciferol 2000 UNITS CAPS Take by mouth 2 (two) times daily.        Marland Kitchen Specialty Vitamins Products (PROSTATE PO) Take by mouth daily.        Marland Kitchen Ubiquinol 50 MG CAPS Take by mouth 2 (two) times daily.        . promethazine (PHENERGAN) 25 MG tablet Take 1 tablet (25 mg total) by  mouth every 6 (six) hours as needed for nausea.  40 tablet  0   Review of Systems Review of Systems  Constitutional: Negative for diaphoresis and unexpected weight change.  HENT: Negative for tinnitus.   Eyes: Negative for photophobia and visual disturbance.  Respiratory: Negative for choking and stridor or cough   Gastrointestinal: Negative for vomiting and blood in stool.  Genitourinary: Negative for hematuria and decreased urine volume.  Musculoskeletal: Negative for gait problem.  Skin: Negative for color change and wound.  Neurological: Negative for tremors and numbness.  Objective:   Physical Exam BP 120/80  Pulse 57  Temp(Src) 97.4 F (36.3 C) (Oral)  Ht 5\' 8"  (1.727 m)  Wt 139 lb (63.05 kg)  BMI 21.13 kg/m2  SpO2 98% Physical Exam  VS noted Constitutional: Pt appears well-developed and well-nourished.  HENT: Head: Normocephalic.  Right Ear: External ear normal.  Left Ear: External ear normal.  Eyes: Conjunctivae and EOM are normal. Pupils are equal, round, and reactive to light.  Neck: Normal range of motion. Neck supple.  Cardiovascular: Normal rate and regular rhythm.   Pulmonary/Chest: Effort normal and breath sounds without rales or wheezing Abd:  Soft, NT, non-distended, + BS Neurological: Pt is alert. Not confused Skin: Skin is warm. No erythema.  Psychiatric: Pt behavior is normal. Thought content normal. not depressed affect    Assessment & Plan:

## 2011-11-27 NOTE — Assessment & Plan Note (Signed)
stable overall by hx and exam, most recent data reviewed with pt, and pt to continue medical treatment as before SpO2 Readings from Last 3 Encounters:  11/23/11 98%  05/24/11 99%

## 2011-11-27 NOTE — Assessment & Plan Note (Signed)
stable overall by hx and exam, most recent data reviewed with pt, and pt to continue medical treatment as before Lab Results  Component Value Date   WBC 5.8 05/24/2011   HGB 14.5 05/24/2011   HCT 42.8 05/24/2011   PLT 130.0* 05/24/2011   GLUCOSE 96 05/24/2011   CHOL 194 05/24/2011   TRIG 57.0 05/24/2011   HDL 84.20 05/24/2011   LDLCALC 98 05/24/2011   ALT 19 05/24/2011   AST 27 05/24/2011   NA 143 05/24/2011   K 4.5 05/24/2011   CL 104 05/24/2011   CREATININE 1.0 05/24/2011   BUN 22 05/24/2011   CO2 31 05/24/2011   TSH 4.08 05/24/2011

## 2012-01-24 ENCOUNTER — Other Ambulatory Visit: Payer: Self-pay | Admitting: Dermatology

## 2012-01-24 DIAGNOSIS — L820 Inflamed seborrheic keratosis: Secondary | ICD-10-CM | POA: Diagnosis not present

## 2012-01-24 DIAGNOSIS — L905 Scar conditions and fibrosis of skin: Secondary | ICD-10-CM | POA: Diagnosis not present

## 2012-01-24 DIAGNOSIS — L570 Actinic keratosis: Secondary | ICD-10-CM | POA: Diagnosis not present

## 2012-02-02 ENCOUNTER — Ambulatory Visit (INDEPENDENT_AMBULATORY_CARE_PROVIDER_SITE_OTHER)
Admission: RE | Admit: 2012-02-02 | Discharge: 2012-02-02 | Disposition: A | Discharge status: Home / Self Care | Payer: Medicare Other | Source: Ambulatory Visit | Attending: Internal Medicine | Admitting: Internal Medicine

## 2012-02-02 ENCOUNTER — Ambulatory Visit (INDEPENDENT_AMBULATORY_CARE_PROVIDER_SITE_OTHER): Payer: Medicare Other | Admitting: Internal Medicine

## 2012-02-02 ENCOUNTER — Encounter: Payer: Self-pay | Admitting: Internal Medicine

## 2012-02-02 VITALS — BP 102/70 | HR 54 | Temp 97.4°F | Ht 68.0 in | Wt 140.4 lb

## 2012-02-02 DIAGNOSIS — J449 Chronic obstructive pulmonary disease, unspecified: Secondary | ICD-10-CM

## 2012-02-02 DIAGNOSIS — M79609 Pain in unspecified limb: Secondary | ICD-10-CM

## 2012-02-02 DIAGNOSIS — Z043 Encounter for examination and observation following other accident: Secondary | ICD-10-CM | POA: Diagnosis not present

## 2012-02-02 DIAGNOSIS — M79645 Pain in left finger(s): Secondary | ICD-10-CM

## 2012-02-02 DIAGNOSIS — M7989 Other specified soft tissue disorders: Secondary | ICD-10-CM | POA: Diagnosis not present

## 2012-02-02 DIAGNOSIS — E785 Hyperlipidemia, unspecified: Secondary | ICD-10-CM | POA: Diagnosis not present

## 2012-02-02 MED ORDER — CEPHALEXIN 500 MG PO CAPS: 500.0000 mg | ORAL_CAPSULE | Freq: Four times a day (QID) | ORAL | Status: AC

## 2012-02-02 NOTE — Patient Instructions (Addendum)
Take all new medications as prescribed - the antibiotic Continue all other medications as before Please go to XRAY in the Basement for the x-ray test You will be contacted by phone if any changes need to be made immediately.  Otherwise, you will receive a letter about your results with an explanation.

## 2012-02-02 NOTE — Progress Notes (Signed)
  Subjective:    Patient ID: Jason Barton, male    DOB: January 27, 1926, 78 y.o.   MRN: [id number]  HPI  Here unfortunately after catching the end of the left index finger in the car door as it closed the day before yesterday, has a small pad laceration that bled for some time but stopped, and as it was still present he thought it might need a stitch; no c/o celluitiis like symptoms or red, or drainage but whole tip of finger marked swollen and bruised.  No fever,  Pt denies chest pain, increased sob or doe, wheezing, orthopnea, PND, increased LE swelling, palpitations, dizziness or syncope.   Pt denies polydipsia, polyuria.  .  Past Medical History  Diagnosis Date  . Nonmelanoma skin cancer 05/21/2011  . S/P inguinal hernia repair 05/21/2011  . S/P appendectomy 05/21/2011  . S/p small bowel obstruction 05/21/2011  . Diverticulosis of colon 05/21/2011  . COPD (chronic obstructive pulmonary disease) 05/21/2011  . Thoracic scoliosis 05/21/2011  . Increased prostate specific antigen (PSA) velocity 05/24/2011   Past Surgical History  Procedure Date  . Appendectomy 1939  . Tonsillectomy 1942  . Shoulder surgery     dr sypher; rotater cuff    reports that he has never smoked. He does not have any smokeless tobacco history on file. He reports that he does not drink alcohol or use illicit drugs. family history includes Arthritis in his other and Sudden death in his other. No Known Allergies Current Outpatient Prescriptions on File Prior to Visit  Medication Sig Dispense Refill  . Cholecalciferol 2000 UNITS CAPS Take by mouth 2 (two) times daily.        Marland Kitchen Specialty Vitamins Products (PROSTATE PO) Take by mouth daily.        . promethazine (PHENERGAN) 25 MG tablet Take 1 tablet (25 mg total) by mouth every 6 (six) hours as needed for nausea.  40 tablet  0  . Ubiquinol 50 MG CAPS Take by mouth 2 (two) times daily.         Review of Systems Review of Systems  Constitutional: Negative for diaphoresis  and unexpected weight change.  HENT: Negative for tinnitus.   Eyes: Negative for photophobia and visual disturbance.  Respiratory: Negative for choking and stridor.   Gastrointestinal: Negative for vomiting and blood in stool.  Genitourinary: Negative for hematuria and decreased urine volume.  Neurological: Negative for tremors and numbness.  Psychiatric/Behavioral: Negative for decreased concentration. The patient is not hyperactive.      Objective:   Physical Exam  BP 102/70  Pulse 54  Temp 97.4 F (36.3 C) (Oral)  Ht 5\' 8"  (1.727 m)  Wt 140 lb 6 oz (63.674 kg)  BMI 21.34 kg/m2  SpO2 98% Physical Exam  VS noted Constitutional: Pt appears well-developed and well-nourished.  HENT: Head: Normocephalic.  Right Ear: External ear normal.  Left Ear: External ear normal.  Eyes: Conjunctivae and EOM are normal. Pupils are equal, round, and reactive to light.  Neck: Normal range of motion. Neck supple.  Cardiovascular: Normal rate and regular rhythm.   Pulmonary/Chest: Effort normal and breath sounds normal.  Neurological: Pt is alert. Not confused Skin: finger pad 2+ swelling, bruisied with .5 cm anterior pad laceration without bleeding or drainage Psychiatric: Pt behavior is normal. Thought content normal.     Assessment & Plan:

## 2012-02-04 ENCOUNTER — Encounter: Payer: Self-pay | Admitting: Internal Medicine

## 2012-02-04 NOTE — Assessment & Plan Note (Signed)
stable overall by hx and exam, most recent data reviewed with pt, and pt to continue medical treatment as before SpO2 Readings from Last 3 Encounters:  02/02/12 98%  11/23/11 98%  05/24/11 99%

## 2012-02-04 NOTE — Assessment & Plan Note (Addendum)
With crush like trauma to finger pad, cant r/o fx, has small laceration but no evidence celulitis o/w neurovasc intact; for film today, tylenol prn,  to f/u any worsening symptoms or concerns, ok for empriic prophlactic antibx

## 2012-02-04 NOTE — Assessment & Plan Note (Signed)
Lab Results  Component Value Date   LDLCALC 98 05/24/2011   stable overall by hx and exam, most recent data reviewed with pt, and pt to continue medical treatment as before

## 2012-02-15 DIAGNOSIS — L570 Actinic keratosis: Secondary | ICD-10-CM | POA: Diagnosis not present

## 2012-02-15 DIAGNOSIS — L821 Other seborrheic keratosis: Secondary | ICD-10-CM | POA: Diagnosis not present

## 2012-02-15 DIAGNOSIS — L910 Hypertrophic scar: Secondary | ICD-10-CM | POA: Diagnosis not present

## 2012-03-15 ENCOUNTER — Other Ambulatory Visit: Payer: Self-pay | Admitting: Dermatology

## 2012-03-15 DIAGNOSIS — L570 Actinic keratosis: Secondary | ICD-10-CM | POA: Diagnosis not present

## 2012-03-15 DIAGNOSIS — L905 Scar conditions and fibrosis of skin: Secondary | ICD-10-CM | POA: Diagnosis not present

## 2012-03-15 DIAGNOSIS — C44319 Basal cell carcinoma of skin of other parts of face: Secondary | ICD-10-CM | POA: Diagnosis not present

## 2012-03-22 DIAGNOSIS — H4910 Fourth [trochlear] nerve palsy, unspecified eye: Secondary | ICD-10-CM | POA: Diagnosis not present

## 2012-03-22 DIAGNOSIS — H43399 Other vitreous opacities, unspecified eye: Secondary | ICD-10-CM | POA: Diagnosis not present

## 2012-03-22 DIAGNOSIS — H532 Diplopia: Secondary | ICD-10-CM | POA: Diagnosis not present

## 2012-03-22 DIAGNOSIS — IMO0002 Reserved for concepts with insufficient information to code with codable children: Secondary | ICD-10-CM | POA: Diagnosis not present

## 2012-04-11 DIAGNOSIS — C44319 Basal cell carcinoma of skin of other parts of face: Secondary | ICD-10-CM | POA: Diagnosis not present

## 2012-04-26 DIAGNOSIS — L910 Hypertrophic scar: Secondary | ICD-10-CM | POA: Diagnosis not present

## 2012-06-08 ENCOUNTER — Other Ambulatory Visit (INDEPENDENT_AMBULATORY_CARE_PROVIDER_SITE_OTHER): Payer: Medicare Other

## 2012-06-08 ENCOUNTER — Encounter: Payer: Self-pay | Admitting: Internal Medicine

## 2012-06-08 ENCOUNTER — Ambulatory Visit (INDEPENDENT_AMBULATORY_CARE_PROVIDER_SITE_OTHER): Payer: Medicare Other | Admitting: Internal Medicine

## 2012-06-08 VITALS — BP 120/82 | HR 55 | Temp 97.4°F | Ht 68.0 in | Wt 144.0 lb

## 2012-06-08 DIAGNOSIS — R7309 Other abnormal glucose: Secondary | ICD-10-CM | POA: Diagnosis not present

## 2012-06-08 DIAGNOSIS — J449 Chronic obstructive pulmonary disease, unspecified: Secondary | ICD-10-CM | POA: Diagnosis not present

## 2012-06-08 DIAGNOSIS — M549 Dorsalgia, unspecified: Secondary | ICD-10-CM | POA: Diagnosis not present

## 2012-06-08 DIAGNOSIS — R7302 Impaired glucose tolerance (oral): Secondary | ICD-10-CM

## 2012-06-08 DIAGNOSIS — E785 Hyperlipidemia, unspecified: Secondary | ICD-10-CM

## 2012-06-08 HISTORY — DX: Impaired glucose tolerance (oral): R73.02

## 2012-06-08 LAB — TSH: TSH: 3.74 u[IU]/mL (ref 0.35–5.50)

## 2012-06-08 LAB — CBC WITH DIFFERENTIAL/PLATELET
Basophils Relative: 0.6 % (ref 0.0–3.0)
Eosinophils Absolute: 0 10*3/uL (ref 0.0–0.7)
Hemoglobin: 14.7 g/dL (ref 13.0–17.0)
Lymphocytes Relative: 35.1 % (ref 12.0–46.0)
MCHC: 34 g/dL (ref 30.0–36.0)
MCV: 90.3 fl (ref 78.0–100.0)
Monocytes Absolute: 0.6 10*3/uL (ref 0.1–1.0)
Neutro Abs: 2.4 10*3/uL (ref 1.4–7.7)
RBC: 4.8 Mil/uL (ref 4.22–5.81)

## 2012-06-08 LAB — URINALYSIS, ROUTINE W REFLEX MICROSCOPIC
Leukocytes, UA: NEGATIVE
Nitrite: NEGATIVE
Specific Gravity, Urine: 1.02 (ref 1.000–1.030)
Urobilinogen, UA: 0.2 (ref 0.0–1.0)
pH: 6.5 (ref 5.0–8.0)

## 2012-06-08 LAB — BASIC METABOLIC PANEL
Calcium: 9.2 mg/dL (ref 8.4–10.5)
Creatinine, Ser: 1 mg/dL (ref 0.4–1.5)
GFR: 80.25 mL/min (ref >60.00)

## 2012-06-08 LAB — HEPATIC FUNCTION PANEL
Bilirubin, Direct: 0.1 mg/dL (ref 0.0–0.3)
Total Bilirubin: 0.8 mg/dL (ref 0.3–1.2)

## 2012-06-08 LAB — LIPID PANEL
Cholesterol: 169 mg/dL (ref 0–200)
Total CHOL/HDL Ratio: 3
Triglycerides: 47 mg/dL (ref 0.0–149.0)

## 2012-06-08 NOTE — Patient Instructions (Addendum)
Please consider returning if you change your mind about the shots Continue all other medications as before Please consider taking Aspirin 81 mg - 1 per day - enteric coated only, to reduce chance of heart disease and stroke Please go to LAB in the Basement for the blood and/or urine tests to be done today You will be contacted by phone if any changes need to be made immediately.  Otherwise, you will receive a letter about your results with an explanation, but please check with MyChart first. Thank you for enrolling in MyChart. Please follow the instructions below to securely access your online medical record. MyChart allows you to send messages to your doctor, view your test results, renew your prescriptions, schedule appointments, and more. To Log into MyChart, please go to [email or web address], and your Username is: [email or web address] Please return in 1 year for your yearly visit, or sooner if needed

## 2012-06-08 NOTE — Progress Notes (Signed)
Subjective:    Patient ID: Jason Barton, male    DOB: 27-Mar-1926, 78 y.o.   MRN: [id number]  HPI    Here for acute, with concern for an episode of mid left parathoracic level pain, quite severe but short lived for 3-4 sec only, occurred after doing some situps for exercise which are part of an overall activity regimen he regularly does at home;  Pt denies bowel or bladder change, fever, wt loss,  worsening LE pain/numbness/weakness, gait change or falls assoc with the pain or otherwise.  Normally able to do the excercises and o/w regularly active - Does ok with playing golf, raking leaves, going to the gym several times per wk.  No fever, recent trauma, falls, rash.  Pt denies chest pain, increased sob or doe, wheezing, orthopnea, PND, increased LE swelling, palpitations, dizziness or syncope.   Pt denies polydipsia, polyuria.  Does not take any regular rx meds - very rx med averse.  Also declines to date to take ASA for prevention as well.     Cont's to work on lower chol diet, wt control.  Declines all immunizations Past Medical History  Diagnosis Date  . Nonmelanoma skin cancer 05/21/2011  . S/P inguinal hernia repair 05/21/2011  . S/P appendectomy 05/21/2011  . S/p small bowel obstruction 05/21/2011  . Diverticulosis of colon 05/21/2011  . COPD (chronic obstructive pulmonary disease) 05/21/2011  . Thoracic scoliosis 05/21/2011  . Increased prostate specific antigen (PSA) velocity 05/24/2011  . Impaired glucose tolerance 06/08/2012   Past Surgical History  Procedure Date  . Appendectomy 1939  . Tonsillectomy 1942  . Shoulder surgery     dr sypher; rotater cuff    reports that he has never smoked. He does not have any smokeless tobacco history on file. He reports that he does not drink alcohol or use illicit drugs. family history includes Arthritis in his other and Sudden death in his other. No Known Allergies Current Outpatient Prescriptions on File Prior to Visit  Medication Sig  Dispense Refill  . Cholecalciferol 2000 UNITS CAPS Take by mouth 2 (two) times daily.        Marland Kitchen Specialty Vitamins Products (PROSTATE PO) Take by mouth daily.        Marland Kitchen Ubiquinol 50 MG CAPS Take by mouth 2 (two) times daily.        . promethazine (PHENERGAN) 25 MG tablet Take 1 tablet (25 mg total) by mouth every 6 (six) hours as needed for nausea.  40 tablet  0    Review of Systems  Constitutional: Negative for diaphoresis and unexpected weight change.  HENT: Negative for tinnitus.   Eyes: Negative for photophobia and visual disturbance.  Respiratory: Negative for choking and stridor.   Gastrointestinal: Negative for vomiting and blood in stool.  Genitourinary: Negative for hematuria and decreased urine volume.  Musculoskeletal: Negative for gait problem.  Skin: Negative for color change and wound.  Neurological: Negative for tremors and numbness.  Psychiatric/Behavioral: Negative for decreased concentration. The patient is not hyperactive.  '    Objective:   Physical Exam BP 120/82  Pulse 55  Temp 97.4 F (36.3 C) (Oral)  Ht 5\' 8"  (1.727 m)  Wt 144 lb (65.318 kg)  BMI 21.90 kg/m2  SpO2 98% Physical Exam  VS noted, not ill appearing Constitutional: Pt appears well-developed and well-nourished.  HENT: Head: Normocephalic.  Right Ear: External ear normal.  Left Ear: External ear normal.  Eyes: Conjunctivae and EOM are normal. Pupils are equal, round,  and reactive to light.  Neck: Normal range of motion. Neck supple.  Cardiovascular: Normal rate and regular rhythm.   Pulmonary/Chest: Effort normal and breath sounds without rales or wheezing Abd:  Soft, NT, non-distended, + BS Spine nontender, no paravertebral tender/swelling/red/rash Neurological: Pt is alert. Not confused , motor/gait intact Skin: Skin is warm. No erythema.  Psychiatric: Pt behavior is normal. Thought content normal.     Assessment & Plan:

## 2012-06-09 ENCOUNTER — Encounter: Payer: Self-pay | Admitting: Internal Medicine

## 2012-06-09 NOTE — Assessment & Plan Note (Signed)
stable overall by hx and exam, most recent data reviewed with pt, and pt to continue medical treatment as before Lab Results  Component Value Date   LDLCALC 93 06/08/2012

## 2012-06-09 NOTE — Assessment & Plan Note (Signed)
stable overall by hx and exam, most recent data reviewed with pt, and pt to continue medical treatment as before SpO2 Readings from Last 3 Encounters:  06/08/12 98%  02/02/12 98%  11/23/11 98%

## 2012-06-09 NOTE — Assessment & Plan Note (Signed)
stable overall by hx and exam, most recent data reviewed with pt, and pt to continue medical treatment as before Lab Results  Component Value Date   HGBA1C 5.5 06/08/2012

## 2012-06-09 NOTE — Assessment & Plan Note (Signed)
Exam benign, most likely c/w MSK spasm/strain short lived, d/w pt  - declines flexeril prn, will try to avoid excercises that could exacerbate

## 2012-07-17 ENCOUNTER — Ambulatory Visit (INDEPENDENT_AMBULATORY_CARE_PROVIDER_SITE_OTHER): Payer: Medicare Other | Admitting: *Deleted

## 2012-07-17 DIAGNOSIS — Z23 Encounter for immunization: Secondary | ICD-10-CM | POA: Diagnosis not present

## 2012-08-23 ENCOUNTER — Other Ambulatory Visit: Payer: Self-pay | Admitting: Dermatology

## 2012-08-23 DIAGNOSIS — L570 Actinic keratosis: Secondary | ICD-10-CM | POA: Diagnosis not present

## 2012-08-23 DIAGNOSIS — D2330 Other benign neoplasm of skin of unspecified part of face: Secondary | ICD-10-CM | POA: Diagnosis not present

## 2012-08-23 DIAGNOSIS — L820 Inflamed seborrheic keratosis: Secondary | ICD-10-CM | POA: Diagnosis not present

## 2012-08-23 DIAGNOSIS — D1801 Hemangioma of skin and subcutaneous tissue: Secondary | ICD-10-CM | POA: Diagnosis not present

## 2012-08-23 DIAGNOSIS — D219 Benign neoplasm of connective and other soft tissue, unspecified: Secondary | ICD-10-CM | POA: Diagnosis not present

## 2012-08-23 DIAGNOSIS — D485 Neoplasm of uncertain behavior of skin: Secondary | ICD-10-CM | POA: Diagnosis not present

## 2012-08-23 DIAGNOSIS — L821 Other seborrheic keratosis: Secondary | ICD-10-CM | POA: Diagnosis not present

## 2012-08-23 DIAGNOSIS — L738 Other specified follicular disorders: Secondary | ICD-10-CM | POA: Diagnosis not present

## 2012-08-23 DIAGNOSIS — D0460 Carcinoma in situ of skin of unspecified upper limb, including shoulder: Secondary | ICD-10-CM | POA: Diagnosis not present

## 2012-08-23 DIAGNOSIS — Z85820 Personal history of malignant melanoma of skin: Secondary | ICD-10-CM | POA: Diagnosis not present

## 2012-09-13 DIAGNOSIS — L821 Other seborrheic keratosis: Secondary | ICD-10-CM | POA: Diagnosis not present

## 2012-09-13 DIAGNOSIS — Z85820 Personal history of malignant melanoma of skin: Secondary | ICD-10-CM | POA: Diagnosis not present

## 2012-09-13 DIAGNOSIS — L570 Actinic keratosis: Secondary | ICD-10-CM | POA: Diagnosis not present

## 2012-10-04 ENCOUNTER — Other Ambulatory Visit: Payer: Self-pay | Admitting: Dermatology

## 2012-10-04 DIAGNOSIS — L570 Actinic keratosis: Secondary | ICD-10-CM | POA: Diagnosis not present

## 2012-10-04 DIAGNOSIS — Z85820 Personal history of malignant melanoma of skin: Secondary | ICD-10-CM | POA: Diagnosis not present

## 2012-10-04 DIAGNOSIS — C44621 Squamous cell carcinoma of skin of unspecified upper limb, including shoulder: Secondary | ICD-10-CM | POA: Diagnosis not present

## 2012-10-04 DIAGNOSIS — D485 Neoplasm of uncertain behavior of skin: Secondary | ICD-10-CM | POA: Diagnosis not present

## 2012-10-04 DIAGNOSIS — L820 Inflamed seborrheic keratosis: Secondary | ICD-10-CM | POA: Diagnosis not present

## 2012-10-22 DIAGNOSIS — C4492 Squamous cell carcinoma of skin, unspecified: Secondary | ICD-10-CM | POA: Diagnosis not present

## 2012-11-01 ENCOUNTER — Other Ambulatory Visit: Payer: Self-pay | Admitting: Dermatology

## 2012-11-01 DIAGNOSIS — D485 Neoplasm of uncertain behavior of skin: Secondary | ICD-10-CM | POA: Diagnosis not present

## 2012-11-01 DIAGNOSIS — C44319 Basal cell carcinoma of skin of other parts of face: Secondary | ICD-10-CM | POA: Diagnosis not present

## 2012-11-05 DIAGNOSIS — C44319 Basal cell carcinoma of skin of other parts of face: Secondary | ICD-10-CM | POA: Diagnosis not present

## 2012-11-05 DIAGNOSIS — D485 Neoplasm of uncertain behavior of skin: Secondary | ICD-10-CM | POA: Diagnosis not present

## 2012-11-06 ENCOUNTER — Other Ambulatory Visit (INDEPENDENT_AMBULATORY_CARE_PROVIDER_SITE_OTHER): Payer: Medicare Other

## 2012-11-06 ENCOUNTER — Encounter: Payer: Self-pay | Admitting: Internal Medicine

## 2012-11-06 ENCOUNTER — Ambulatory Visit (INDEPENDENT_AMBULATORY_CARE_PROVIDER_SITE_OTHER): Payer: Medicare Other | Admitting: Internal Medicine

## 2012-11-06 VITALS — BP 118/74 | HR 62 | Temp 97.5°F | Ht 68.0 in | Wt 147.1 lb

## 2012-11-06 DIAGNOSIS — R7309 Other abnormal glucose: Secondary | ICD-10-CM | POA: Diagnosis not present

## 2012-11-06 DIAGNOSIS — E785 Hyperlipidemia, unspecified: Secondary | ICD-10-CM

## 2012-11-06 DIAGNOSIS — R7302 Impaired glucose tolerance (oral): Secondary | ICD-10-CM

## 2012-11-06 DIAGNOSIS — R109 Unspecified abdominal pain: Secondary | ICD-10-CM

## 2012-11-06 LAB — URINALYSIS, ROUTINE W REFLEX MICROSCOPIC
Nitrite: NEGATIVE
Specific Gravity, Urine: 1.025 (ref 1.000–1.030)
Urine Glucose: NEGATIVE
Urobilinogen, UA: 0.2 (ref 0.0–1.0)

## 2012-11-06 NOTE — Assessment & Plan Note (Signed)
Exam benign, cant ro renal stone - for UA, but if neg would follow for any worsening/change s/s

## 2012-11-06 NOTE — Progress Notes (Addendum)
Subjective:    Patient ID: Jason Barton, male    DOB: [birth year], 79 y.o.   MRN: [id number]  HPI  Here with c/o somewhat vague discomfort to left lateral abdomen off and on for 3-4 days. Denies worsening reflux, dysphagia, n/v, bowel change or blood.  Denies urinary symptoms such as dysuria, frequency, urgency, flank pain, hematuria or n/v, fever, chills. Did have left lumbar back spasm pain for 3-4 days about 2 wks ago after some lifiting, thinks may be related to that.  [place]  No further LBP.  Pt denies fever, wt loss, night sweats, loss of appetite, or other constitutional symptoms  Pt denies chest pain, increased sob or doe, wheezing, orthopnea, PND, increased LE swelling, palpitations, dizziness or syncope.  Pt denies polydipsia, polyuria Past Medical History  Diagnosis Date  . Nonmelanoma skin cancer 05/21/2011  . S/P inguinal hernia repair 05/21/2011  . S/P appendectomy 05/21/2011  . S/p small bowel obstruction 05/21/2011  . Diverticulosis of colon 05/21/2011  . COPD (chronic obstructive pulmonary disease) 05/21/2011  . Thoracic scoliosis 05/21/2011  . Increased prostate specific antigen (PSA) velocity 05/24/2011  . Impaired glucose tolerance 06/08/2012   Past Surgical History  Procedure Laterality Date  . Appendectomy  1939  . Tonsillectomy  1942  . Shoulder surgery      dr sypher; rotater cuff    reports that he has never smoked. He does not have any smokeless tobacco history on file. He reports that he does not drink alcohol or use illicit drugs. family history includes Arthritis in his other and Sudden death in his other. No Known Allergies Current Outpatient Prescriptions on File Prior to Visit  Medication Sig Dispense Refill  . Cholecalciferol 2000 UNITS CAPS Take by mouth 2 (two) times daily.        Marland Kitchen Specialty Vitamins Products (PROSTATE PO) Take by mouth daily.        Marland Kitchen Ubiquinol 50 MG CAPS Take by mouth 2 (two) times daily.        .  promethazine (PHENERGAN) 25 MG tablet Take 1 tablet (25 mg total) by mouth every 6 (six) hours as needed for nausea.  40 tablet  0   No current facility-administered medications on file prior to visit.   Review of Systems  Constitutional: Negative for unexpected weight change, or unusual diaphoresis  HENT: Negative for tinnitus.   Eyes: Negative for photophobia and visual disturbance.  Respiratory: Negative for choking and stridor.   Gastrointestinal: Negative for vomiting and blood in stool.  Genitourinary: Negative for hematuria and decreased urine volume.  Musculoskeletal: Negative for acute joint swelling Skin: Negative for color change and wound.  Neurological: Negative for tremors and numbness other than noted  Psychiatric/Behavioral: Negative for decreased concentration or  hyperactivity.       Objective:   Physical Exam BP 118/74  Pulse 62  Temp(Src) 97.5 F (36.4 C) (Oral)  Ht 5\' 8"  (1.727 m)  Wt 147 lb 2 oz (66.735 kg)  BMI 22.38 kg/m2  SpO2 97% VS noted,  Constitutional: Pt appears well-developed and well-nourished.  HENT: Head: NCAT.  Right Ear: External ear normal.  Left Ear: External ear normal.  Eyes: Conjunctivae and EOM are normal. Pupils are equal, round, and reactive to light.  Neck: Normal range of motion. Neck supple.  Cardiovascular: Normal rate and regular rhythm.   Pulmonary/Chest: Effort normal and breath sounds normal.  Abd:  Soft, NT, non-distended, + BS- benign exam Neurological: Pt  is alert. Not confused  Skin: Skin is warm. No erythema.  Psychiatric: Pt behavior is normal. Thought content normal.     Assessment & Plan:  Quality Measures addressed:  Pneumonia Vaccine: pt declines, may self-refer to local pharmacy

## 2012-11-06 NOTE — Assessment & Plan Note (Signed)
Asympt,  to f/u any worsening symptoms or concerns Lab Results  Component Value Date   HGBA1C 5.5 06/08/2012

## 2012-11-06 NOTE — Patient Instructions (Addendum)
Please continue all other medications as before Please have the pharmacy call with any other refills you may need. Please go to the LAB in the Basement (turn left off the elevator) for the tests to be done today - just the urine testing today OK to see Vicie Mutters today about your wife's emails

## 2012-11-06 NOTE — Assessment & Plan Note (Signed)
stable overall by history and exam, recent data reviewed with pt, and pt to continue medical treatment as before,  to f/u any worsening symptoms or concerns Lab Results  Component Value Date   LDLCALC 93 06/08/2012

## 2012-11-23 DIAGNOSIS — N401 Enlarged prostate with lower urinary tract symptoms: Secondary | ICD-10-CM | POA: Diagnosis not present

## 2012-12-03 DIAGNOSIS — Z85828 Personal history of other malignant neoplasm of skin: Secondary | ICD-10-CM | POA: Diagnosis not present

## 2012-12-03 DIAGNOSIS — Z85820 Personal history of malignant melanoma of skin: Secondary | ICD-10-CM | POA: Diagnosis not present

## 2012-12-03 DIAGNOSIS — C44319 Basal cell carcinoma of skin of other parts of face: Secondary | ICD-10-CM | POA: Diagnosis not present

## 2012-12-10 DIAGNOSIS — L570 Actinic keratosis: Secondary | ICD-10-CM | POA: Diagnosis not present

## 2013-02-21 ENCOUNTER — Other Ambulatory Visit: Payer: Self-pay | Admitting: Dermatology

## 2013-02-21 DIAGNOSIS — Z85820 Personal history of malignant melanoma of skin: Secondary | ICD-10-CM | POA: Diagnosis not present

## 2013-02-21 DIAGNOSIS — L570 Actinic keratosis: Secondary | ICD-10-CM | POA: Diagnosis not present

## 2013-02-21 DIAGNOSIS — L821 Other seborrheic keratosis: Secondary | ICD-10-CM | POA: Diagnosis not present

## 2013-02-21 DIAGNOSIS — D485 Neoplasm of uncertain behavior of skin: Secondary | ICD-10-CM | POA: Diagnosis not present

## 2013-02-21 DIAGNOSIS — L820 Inflamed seborrheic keratosis: Secondary | ICD-10-CM | POA: Diagnosis not present

## 2013-02-21 DIAGNOSIS — C44320 Squamous cell carcinoma of skin of unspecified parts of face: Secondary | ICD-10-CM | POA: Diagnosis not present

## 2013-02-21 DIAGNOSIS — Z85828 Personal history of other malignant neoplasm of skin: Secondary | ICD-10-CM | POA: Diagnosis not present

## 2013-04-02 DIAGNOSIS — H43399 Other vitreous opacities, unspecified eye: Secondary | ICD-10-CM | POA: Diagnosis not present

## 2013-04-02 DIAGNOSIS — H4910 Fourth [trochlear] nerve palsy, unspecified eye: Secondary | ICD-10-CM | POA: Diagnosis not present

## 2013-04-25 ENCOUNTER — Other Ambulatory Visit: Payer: Self-pay

## 2013-04-25 DIAGNOSIS — H532 Diplopia: Secondary | ICD-10-CM | POA: Diagnosis not present

## 2013-05-06 ENCOUNTER — Other Ambulatory Visit (HOSPITAL_COMMUNITY): Payer: Self-pay | Admitting: *Deleted

## 2013-05-06 DIAGNOSIS — H532 Diplopia: Secondary | ICD-10-CM

## 2013-05-06 DIAGNOSIS — H05409 Unspecified enophthalmos, unspecified eye: Secondary | ICD-10-CM

## 2013-05-09 ENCOUNTER — Ambulatory Visit (HOSPITAL_COMMUNITY)
Admission: RE | Admit: 2013-05-09 | Discharge: 2013-05-09 | Disposition: A | Discharge status: Home / Self Care | Payer: Medicare Other | Source: Ambulatory Visit | Attending: Internal Medicine | Admitting: Internal Medicine

## 2013-05-09 ENCOUNTER — Encounter (HOSPITAL_COMMUNITY): Payer: Self-pay

## 2013-05-09 DIAGNOSIS — H05409 Unspecified enophthalmos, unspecified eye: Secondary | ICD-10-CM | POA: Diagnosis not present

## 2013-05-09 DIAGNOSIS — H532 Diplopia: Secondary | ICD-10-CM | POA: Diagnosis not present

## 2013-05-09 DIAGNOSIS — G319 Degenerative disease of nervous system, unspecified: Secondary | ICD-10-CM | POA: Diagnosis not present

## 2013-06-06 DIAGNOSIS — H532 Diplopia: Secondary | ICD-10-CM | POA: Diagnosis not present

## 2013-06-11 ENCOUNTER — Encounter: Payer: Self-pay | Admitting: Internal Medicine

## 2013-06-11 ENCOUNTER — Other Ambulatory Visit (INDEPENDENT_AMBULATORY_CARE_PROVIDER_SITE_OTHER): Payer: Medicare Other

## 2013-06-11 ENCOUNTER — Ambulatory Visit (INDEPENDENT_AMBULATORY_CARE_PROVIDER_SITE_OTHER): Payer: Medicare Other | Admitting: Internal Medicine

## 2013-06-11 VITALS — BP 132/80 | HR 58 | Temp 97.0°F | Ht 68.0 in | Wt 148.1 lb

## 2013-06-11 DIAGNOSIS — Z23 Encounter for immunization: Secondary | ICD-10-CM | POA: Diagnosis not present

## 2013-06-11 DIAGNOSIS — E785 Hyperlipidemia, unspecified: Secondary | ICD-10-CM

## 2013-06-11 DIAGNOSIS — Z136 Encounter for screening for cardiovascular disorders: Secondary | ICD-10-CM

## 2013-06-11 DIAGNOSIS — R7309 Other abnormal glucose: Secondary | ICD-10-CM

## 2013-06-11 DIAGNOSIS — J449 Chronic obstructive pulmonary disease, unspecified: Secondary | ICD-10-CM | POA: Diagnosis not present

## 2013-06-11 DIAGNOSIS — R7302 Impaired glucose tolerance (oral): Secondary | ICD-10-CM

## 2013-06-11 LAB — URINALYSIS, ROUTINE W REFLEX MICROSCOPIC
Bilirubin Urine: NEGATIVE
Hgb urine dipstick: NEGATIVE
Leukocytes, UA: NEGATIVE
Nitrite: NEGATIVE
Urobilinogen, UA: 0.2 (ref 0.0–1.0)
pH: 7.5 (ref 5.0–8.0)

## 2013-06-11 LAB — CBC WITH DIFFERENTIAL/PLATELET
Basophils Absolute: 0 10*3/uL (ref 0.0–0.1)
Eosinophils Absolute: 0.1 10*3/uL (ref 0.0–0.7)
Eosinophils Relative: 1.7 % (ref 0.0–5.0)
Hemoglobin: 14.7 g/dL (ref 13.0–17.0)
MCHC: 33.8 g/dL (ref 30.0–36.0)
MCV: 89.5 fl (ref 78.0–100.0)
Monocytes Absolute: 0.5 10*3/uL (ref 0.1–1.0)
Neutrophils Relative %: 65.2 % (ref 43.0–77.0)
Platelets: 154 10*3/uL (ref 150.0–400.0)
RDW: 13.8 % (ref 11.5–14.6)
WBC: 5.4 10*3/uL (ref 4.5–10.5)

## 2013-06-11 LAB — BASIC METABOLIC PANEL
BUN: 14 mg/dL (ref 6–23)
Calcium: 9.1 mg/dL (ref 8.4–10.5)
Chloride: 104 mEq/L (ref 96–112)
Glucose, Bld: 95 mg/dL (ref 70–99)
Potassium: 4.3 mEq/L (ref 3.5–5.1)
Sodium: 139 mEq/L (ref 135–145)

## 2013-06-11 LAB — TSH: TSH: 5.75 u[IU]/mL — ABNORMAL HIGH (ref 0.35–5.50)

## 2013-06-11 LAB — HEPATIC FUNCTION PANEL
ALT: 21 U/L (ref 0–53)
AST: 21 U/L (ref 0–37)
Albumin: 4.3 g/dL (ref 3.5–5.2)
Alkaline Phosphatase: 63 U/L (ref 39–117)
Total Bilirubin: 1 mg/dL (ref 0.3–1.2)

## 2013-06-11 LAB — LIPID PANEL
Cholesterol: 176 mg/dL (ref 0–200)
Total CHOL/HDL Ratio: 3
VLDL: 8.8 mg/dL (ref 0.0–40.0)

## 2013-06-11 NOTE — Assessment & Plan Note (Signed)
stable overall by history and exam, recent data reviewed with pt, and pt to continue medical treatment as before,  to f/u any worsening symptoms or concerns SpO2 Readings from Last 3 Encounters:  06/11/13 98%  11/06/12 97%  06/08/12 98%

## 2013-06-11 NOTE — Patient Instructions (Signed)
You had the new Prevnar pneumonia shot today Please continue all other medications as before, and refills have been done if requested. Please have the pharmacy call with any other refills you may need. Please continue your efforts at being more active, low cholesterol diet, and weight control. You are otherwise up to date with prevention measures today. Please keep your appointments with your specialists as you have planned - dermatology  Please go to the LAB in the Basement (turn left off the elevator) for the tests to be done today You will be contacted by phone if any changes need to be made immediately.  Otherwise, you will receive a letter about your results with an explanation, but please check with MyChart first.  Please return in 1 year for your yearly visit, or sooner if needed

## 2013-06-11 NOTE — Assessment & Plan Note (Addendum)
stable overall by history and exam, recent data reviewed with pt, and pt to continue medical treatment as before,  to f/u any worsening symptoms or concerns Lab Results  Component Value Date   LDLCALC 93 06/08/2012    for f/u labs; ECG reviewed as per emr

## 2013-06-11 NOTE — Progress Notes (Signed)
Subjective:    Patient ID: Jason Barton, male    DOB: [birth year], 79 y.o.   MRN: [id number]  HPI  Here for yearly f/u;  Overall doing ok;  Pt denies CP, worsening SOB, DOE, wheezing, orthopnea, PND, worsening LE edema, palpitations, dizziness or syncope.  Pt denies neurological change such as new headache, facial or extremity weakness.  Pt denies polydipsia, polyuria, or low sugar symptoms. Pt states overall good compliance with treatment and medications, good tolerability, and has been trying to follow lower cholesterol diet.  Pt denies worsening depressive symptoms, suicidal ideation or panic. No fever, night sweats, wt loss, loss of appetite, or other constitutional symptoms.  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  Denies urinary symptoms such as dysuria, frequency, urgency, flank pain, hematuria or n/v, fever, chills, no urinary hesitancy or retention.   Past Medical History  Diagnosis Date  . Nonmelanoma skin cancer 05/21/2011  . S/P inguinal hernia repair 05/21/2011  . S/P appendectomy 05/21/2011  . S/p small bowel obstruction 05/21/2011  . Diverticulosis of colon 05/21/2011  . COPD (chronic obstructive pulmonary disease) 05/21/2011  . Thoracic scoliosis 05/21/2011  . Increased prostate specific antigen (PSA) velocity 05/24/2011  . Impaired glucose tolerance 06/08/2012   Past Surgical History  Procedure Laterality Date  . Appendectomy  1939  . Tonsillectomy  1942  . Shoulder surgery      dr sypher; rotater cuff    reports that he has never smoked. He does not have any smokeless tobacco history on file. He reports that he does not drink alcohol or use illicit drugs. family history includes Arthritis in his other; Sudden death in his other. No Known Allergies Current Outpatient Prescriptions on File Prior  to Visit  Medication Sig Dispense Refill  . Cholecalciferol 2000 UNITS CAPS Take by mouth 2 (two) times daily.        Marland Kitchen Specialty Vitamins Products (PROSTATE PO) Take by mouth daily.        Marland Kitchen Ubiquinol 50 MG CAPS Take by mouth 2 (two) times daily.        . promethazine (PHENERGAN) 25 MG tablet Take 1 tablet (25 mg total) by mouth every 6 (six) hours as needed for nausea.  40 tablet  0   No current facility-administered medications on file prior to visit.   Review of Systems Constitutional: Negative for diaphoresis, activity change, appetite change or unexpected weight change.  HENT: Negative for hearing loss, ear pain, facial swelling, mouth sores and neck stiffness.   Eyes: Negative for pain, redness and visual disturbance.  Respiratory: Negative for shortness of breath and wheezing.   Cardiovascular: Negative for chest pain and palpitations.  Gastrointestinal: Negative for diarrhea, blood in stool, abdominal distention or other pain Genitourinary: Negative for hematuria, flank pain or change in urine volume.  Musculoskeletal: Negative for myalgias and joint swelling.  Skin: Negative for color change and wound.  Neurological: Negative for syncope and numbness. other than noted Hematological: Negative for adenopathy.  Psychiatric/Behavioral: Negative for hallucinations, self-injury, decreased concentration and agitation.      Objective:   Physical Exam BP 132/80  Pulse 58  Temp(Src) 97 F (36.1 C) (Oral)  Ht 5\' 8"  (1.727 m)  Wt 148 lb 2 oz (67.189 kg)  BMI 22.53 kg/m2  SpO2 98% VS noted,  Constitutional: Pt is oriented  to person, place, and time. Appears well-developed and well-nourished.  Head: Normocephalic and atraumatic.  Right Ear: External ear normal.  Left Ear: External ear normal.  Nose: Nose normal.  Mouth/Throat: Oropharynx is clear and moist.  Eyes: Conjunctivae and EOM are normal. Pupils are equal, round, and reactive to light.  Neck: Normal range of motion. Neck  supple. No JVD present. No tracheal deviation present.  Cardiovascular: Normal rate, regular rhythm, normal heart sounds and intact distal pulses.   Pulmonary/Chest: Effort normal and breath sounds normal.  Abdominal: Soft. Bowel sounds are normal. There is no tenderness. No HSM  Musculoskeletal: Normal range of motion. Exhibits no edema.  Lymphadenopathy:  Has no cervical adenopathy.  Neurological: Pt is alert and oriented to person, place, and time. Pt has normal reflexes. No cranial nerve deficit.  Skin: Skin is warm and dry. No rash noted.  Psychiatric:  Has  normal mood and affect. Behavior is normal.     Assessment & Plan:

## 2013-06-11 NOTE — Addendum Note (Signed)
Addended by: Scharlene Gloss B on: 06/11/2013 09:26 AM   Modules accepted: Orders

## 2013-06-11 NOTE — Progress Notes (Signed)
Pre-visit discussion using our clinic review tool. No additional management support is needed unless otherwise documented below in the visit note.  

## 2013-06-11 NOTE — Assessment & Plan Note (Signed)
stable overall by history and exam, recent data reviewed with pt, and pt to continue medical treatment as before,  to f/u any worsening symptoms or concerns Lab Results  Component Value Date   HGBA1C 5.5 06/08/2012   For f/u lab

## 2013-06-14 LAB — HEMOGLOBIN A1C: Hgb A1c MFr Bld: 5.4 % (ref 4.6–6.5)

## 2013-06-18 ENCOUNTER — Telehealth: Payer: Self-pay | Admitting: *Deleted

## 2013-06-18 NOTE — Telephone Encounter (Signed)
Pt called requesting lab results be released in [place].  Please advise

## 2013-06-18 NOTE — Telephone Encounter (Signed)
Patient informed and did mail a copy to his home.

## 2013-06-18 NOTE — Telephone Encounter (Signed)
All labs are on MyChart after 3 days  We can also send a copy of labs if he wants

## 2013-06-18 NOTE — Telephone Encounter (Signed)
All labs essentially normal

## 2013-06-26 ENCOUNTER — Other Ambulatory Visit: Payer: Self-pay | Admitting: Dermatology

## 2013-06-26 DIAGNOSIS — L570 Actinic keratosis: Secondary | ICD-10-CM | POA: Diagnosis not present

## 2013-06-26 DIAGNOSIS — L821 Other seborrheic keratosis: Secondary | ICD-10-CM | POA: Diagnosis not present

## 2013-06-26 DIAGNOSIS — D239 Other benign neoplasm of skin, unspecified: Secondary | ICD-10-CM | POA: Diagnosis not present

## 2013-06-26 DIAGNOSIS — D0460 Carcinoma in situ of skin of unspecified upper limb, including shoulder: Secondary | ICD-10-CM | POA: Diagnosis not present

## 2013-06-26 DIAGNOSIS — C44721 Squamous cell carcinoma of skin of unspecified lower limb, including hip: Secondary | ICD-10-CM | POA: Diagnosis not present

## 2013-06-26 DIAGNOSIS — Z85820 Personal history of malignant melanoma of skin: Secondary | ICD-10-CM | POA: Diagnosis not present

## 2013-06-26 DIAGNOSIS — D1801 Hemangioma of skin and subcutaneous tissue: Secondary | ICD-10-CM | POA: Diagnosis not present

## 2013-06-26 DIAGNOSIS — D485 Neoplasm of uncertain behavior of skin: Secondary | ICD-10-CM | POA: Diagnosis not present

## 2013-06-26 DIAGNOSIS — C44621 Squamous cell carcinoma of skin of unspecified upper limb, including shoulder: Secondary | ICD-10-CM | POA: Diagnosis not present

## 2013-06-26 DIAGNOSIS — Z85828 Personal history of other malignant neoplasm of skin: Secondary | ICD-10-CM | POA: Diagnosis not present

## 2013-06-27 DIAGNOSIS — H532 Diplopia: Secondary | ICD-10-CM | POA: Diagnosis not present

## 2013-06-27 DIAGNOSIS — H05409 Unspecified enophthalmos, unspecified eye: Secondary | ICD-10-CM | POA: Diagnosis not present

## 2013-07-10 DIAGNOSIS — C44721 Squamous cell carcinoma of skin of unspecified lower limb, including hip: Secondary | ICD-10-CM | POA: Diagnosis not present

## 2013-07-10 DIAGNOSIS — Z85828 Personal history of other malignant neoplasm of skin: Secondary | ICD-10-CM | POA: Diagnosis not present

## 2013-07-10 DIAGNOSIS — Z85820 Personal history of malignant melanoma of skin: Secondary | ICD-10-CM | POA: Diagnosis not present

## 2013-07-11 ENCOUNTER — Telehealth: Payer: Self-pay | Admitting: Internal Medicine

## 2013-07-11 NOTE — Telephone Encounter (Signed)
Relevant patient education assigned to patient using Emmi. ° °

## 2013-07-26 DIAGNOSIS — L820 Inflamed seborrheic keratosis: Secondary | ICD-10-CM | POA: Diagnosis not present

## 2013-07-26 DIAGNOSIS — L821 Other seborrheic keratosis: Secondary | ICD-10-CM | POA: Diagnosis not present

## 2013-07-26 DIAGNOSIS — Z85820 Personal history of malignant melanoma of skin: Secondary | ICD-10-CM | POA: Diagnosis not present

## 2013-07-26 DIAGNOSIS — L0291 Cutaneous abscess, unspecified: Secondary | ICD-10-CM | POA: Diagnosis not present

## 2013-07-26 DIAGNOSIS — Z85828 Personal history of other malignant neoplasm of skin: Secondary | ICD-10-CM | POA: Diagnosis not present

## 2013-09-25 ENCOUNTER — Other Ambulatory Visit: Payer: Self-pay | Admitting: Dermatology

## 2013-09-25 ENCOUNTER — Ambulatory Visit: Payer: Medicare Other | Admitting: Internal Medicine

## 2013-09-25 DIAGNOSIS — Z85828 Personal history of other malignant neoplasm of skin: Secondary | ICD-10-CM | POA: Diagnosis not present

## 2013-09-25 DIAGNOSIS — L570 Actinic keratosis: Secondary | ICD-10-CM | POA: Diagnosis not present

## 2013-09-25 DIAGNOSIS — D239 Other benign neoplasm of skin, unspecified: Secondary | ICD-10-CM | POA: Diagnosis not present

## 2013-09-25 DIAGNOSIS — D0439 Carcinoma in situ of skin of other parts of face: Secondary | ICD-10-CM | POA: Diagnosis not present

## 2013-09-25 DIAGNOSIS — L820 Inflamed seborrheic keratosis: Secondary | ICD-10-CM | POA: Diagnosis not present

## 2013-09-25 DIAGNOSIS — D485 Neoplasm of uncertain behavior of skin: Secondary | ICD-10-CM | POA: Diagnosis not present

## 2013-09-25 DIAGNOSIS — C44721 Squamous cell carcinoma of skin of unspecified lower limb, including hip: Secondary | ICD-10-CM | POA: Diagnosis not present

## 2013-09-25 DIAGNOSIS — D0430 Carcinoma in situ of skin of unspecified part of face: Secondary | ICD-10-CM | POA: Diagnosis not present

## 2013-09-25 DIAGNOSIS — Z85820 Personal history of malignant melanoma of skin: Secondary | ICD-10-CM | POA: Diagnosis not present

## 2013-09-25 DIAGNOSIS — C44320 Squamous cell carcinoma of skin of unspecified parts of face: Secondary | ICD-10-CM | POA: Diagnosis not present

## 2013-09-25 DIAGNOSIS — L821 Other seborrheic keratosis: Secondary | ICD-10-CM | POA: Diagnosis not present

## 2013-10-01 ENCOUNTER — Other Ambulatory Visit: Payer: Self-pay | Admitting: Internal Medicine

## 2013-10-01 ENCOUNTER — Ambulatory Visit (INDEPENDENT_AMBULATORY_CARE_PROVIDER_SITE_OTHER)
Admission: RE | Admit: 2013-10-01 | Discharge: 2013-10-01 | Disposition: A | Discharge status: Home / Self Care | Payer: Medicare Other | Source: Ambulatory Visit | Attending: Internal Medicine | Admitting: Internal Medicine

## 2013-10-01 ENCOUNTER — Ambulatory Visit (INDEPENDENT_AMBULATORY_CARE_PROVIDER_SITE_OTHER): Payer: Medicare Other | Admitting: Internal Medicine

## 2013-10-01 ENCOUNTER — Encounter: Payer: Self-pay | Admitting: Internal Medicine

## 2013-10-01 VITALS — BP 130/83 | HR 58 | Temp 97.6°F | Ht 68.0 in | Wt 149.4 lb

## 2013-10-01 DIAGNOSIS — S2239XA Fracture of one rib, unspecified side, initial encounter for closed fracture: Secondary | ICD-10-CM

## 2013-10-01 DIAGNOSIS — R079 Chest pain, unspecified: Secondary | ICD-10-CM | POA: Diagnosis not present

## 2013-10-01 DIAGNOSIS — R7309 Other abnormal glucose: Secondary | ICD-10-CM

## 2013-10-01 DIAGNOSIS — R7302 Impaired glucose tolerance (oral): Secondary | ICD-10-CM

## 2013-10-01 DIAGNOSIS — J449 Chronic obstructive pulmonary disease, unspecified: Secondary | ICD-10-CM

## 2013-10-01 DIAGNOSIS — M8580 Other specified disorders of bone density and structure, unspecified site: Secondary | ICD-10-CM

## 2013-10-01 DIAGNOSIS — S298XXA Other specified injuries of thorax, initial encounter: Secondary | ICD-10-CM | POA: Diagnosis not present

## 2013-10-01 NOTE — Progress Notes (Signed)
Pre visit review using our clinic review tool, if applicable. No additional management support is needed unless otherwise documented below in the visit note. 

## 2013-10-01 NOTE — Patient Instructions (Signed)
Please continue all other medications as before, and refills have been done if requested. Please have the pharmacy call with any other refills you may need.  Please continue your efforts at being more active, low cholesterol diet, and weight control.  Please go to the XRAY Department in the Basement (go straight as you get off the elevator) for the x-ray testing  You will be contacted by phone if any changes need to be made immediately.  Otherwise, you will receive a letter about your results with an explanation, but please check with MyChart first.  If the xray shows fracture, you may wish to have a bone density test done, to make sure you also do not have osteoporosis  Please see Lorelei Pont (our scheduler) regarding having your wife see Dr Asa Lente

## 2013-10-01 NOTE — Progress Notes (Signed)
Subjective:    Patient ID: Jason Barton, male    DOB: 1925/07/04, 80 y.o.   MRN: [id number]  HPI  Here after 2 wks ago was taking car to get oil change, wa stopped and simplly reached to the other side of the car to grasp a coffee cup, had pop and crack and sudden severe pain to the right lower lateral chest as he hit the middle console of the car, pain now constant, mild to mod but worse to lie on right side and deep breaths. Pt denies other chest pain, increased sob or doe, wheezing, orthopnea, PND, increased LE swelling, palpitations, dizziness or syncope.  Pt denies new neurological symptoms such as new headache, or facial or extremity weakness or numbness   Pt denies polydipsia, polyuria,  Past Medical History  Diagnosis Date  . Nonmelanoma skin cancer 05/21/2011  . S/P inguinal hernia repair 05/21/2011  . S/P appendectomy 05/21/2011  . S/p small bowel obstruction 05/21/2011  . Diverticulosis of colon 05/21/2011  . COPD (chronic obstructive pulmonary disease) 05/21/2011  . Thoracic scoliosis 05/21/2011  . Increased prostate specific antigen (PSA) velocity 05/24/2011  . Impaired glucose tolerance 06/08/2012   Past Surgical History  Procedure Laterality Date  . Appendectomy  1939  . Tonsillectomy  1942  . Shoulder surgery      dr sypher; rotater cuff    reports that he has never smoked. He does not have any smokeless tobacco history on file. He reports that he does not drink alcohol or use illicit drugs. family history includes Arthritis in his other; Sudden death in his other. No Known Allergies Current Outpatient Prescriptions on File Prior to Visit  Medication Sig Dispense Refill  . Cholecalciferol 2000 UNITS CAPS Take by mouth 2 (two) times daily.       Marland Kitchen Specialty Vitamins Products (PROSTATE PO) Take by mouth daily.        Marland Kitchen Ubiquinol 50 MG CAPS Take by mouth 2 (two) times daily.        . promethazine (PHENERGAN) 25 MG tablet Take 1 tablet (25 mg total) by mouth every 6 (six)  hours as needed for nausea.  40 tablet  0   No current facility-administered medications on file prior to visit.   Review of Systems  Constitutional: Negative for unexpected weight change, or unusual diaphoresis  HENT: Negative for tinnitus.   Eyes: Negative for photophobia and visual disturbance.  Respiratory: Negative for choking and stridor.   Gastrointestinal: Negative for vomiting and blood in stool.  Genitourinary: Negative for hematuria and decreased urine volume.  Musculoskeletal: Negative for acute joint swelling Skin: Negative for color change and wound.  Neurological: Negative for tremors and numbness other than noted  Psychiatric/Behavioral: Negative for decreased concentration or  hyperactivity.       Objective:   Physical Exam BP 130/83  Pulse 58  Temp(Src) 97.6 F (36.4 C) (Oral)  Ht 5\' 8"  (1.727 m)  Wt 149 lb 6 oz (67.756 kg)  BMI 22.72 kg/m2  SpO2 97% VS noted,  Constitutional: Pt appears well-developed and well-nourished.  HENT: Head: NCAT.  Right Ear: External ear normal.  Left Ear: External ear normal.  Eyes: Conjunctivae and EOM are normal. Pupils are equal, round, and reactive to light.  Neck: Normal range of motion. Neck supple.  Cardiovascular: Normal rate and regular rhythm.   Pulmonary/Chest: Effort normal and breath sounds normal.  Abd:  Soft, NT, non-distended, + BS Tender area to right lateral ribs approx t8, no bruising,  swelling Neurological: Pt is alert. Not confused  Skin: Skin is warm. No erythema.  Psychiatric: Pt behavior is normal. Thought content normal.     Assessment & Plan:

## 2013-10-05 NOTE — Assessment & Plan Note (Signed)
stable overall by history and exam, recent data reviewed with pt, and pt to continue medical treatment as before,  to f/u any worsening symptoms or concerns Lab Results  Component Value Date   HGBA1C 5.4 06/11/2013

## 2013-10-05 NOTE — Assessment & Plan Note (Signed)
stable overall by history and exam, recent data reviewed with pt, and pt to continue medical treatment as before,  to f/u any worsening symptoms or concerns SpO2 Readings from Last 3 Encounters:  10/01/13 97%  06/11/13 98%  11/06/12 97%

## 2013-10-05 NOTE — Assessment & Plan Note (Signed)
Suspect non displaced rib fx, ? Underlying osteoporosis with relatively minor trauma, declines pain med, for rib films/cxr,  to f/u any worsening symptoms or concerns

## 2013-11-27 DIAGNOSIS — N401 Enlarged prostate with lower urinary tract symptoms: Secondary | ICD-10-CM | POA: Diagnosis not present

## 2013-11-27 DIAGNOSIS — N139 Obstructive and reflux uropathy, unspecified: Secondary | ICD-10-CM | POA: Diagnosis not present

## 2014-01-13 ENCOUNTER — Encounter: Payer: Self-pay | Admitting: Cardiology

## 2014-01-13 NOTE — Telephone Encounter (Signed)
  This encounter was created in error - please disregard. Patient called back regarding his wife

## 2014-01-13 NOTE — Telephone Encounter (Signed)
Follow up:    Pt returning call from 7/24 please give him a call back.

## 2014-01-17 ENCOUNTER — Other Ambulatory Visit: Payer: Self-pay | Admitting: Dermatology

## 2014-01-17 DIAGNOSIS — D045 Carcinoma in situ of skin of trunk: Secondary | ICD-10-CM | POA: Diagnosis not present

## 2014-01-17 DIAGNOSIS — C44529 Squamous cell carcinoma of skin of other part of trunk: Secondary | ICD-10-CM | POA: Diagnosis not present

## 2014-01-17 DIAGNOSIS — D0439 Carcinoma in situ of skin of other parts of face: Secondary | ICD-10-CM | POA: Diagnosis not present

## 2014-01-17 DIAGNOSIS — D0430 Carcinoma in situ of skin of unspecified part of face: Secondary | ICD-10-CM | POA: Diagnosis not present

## 2014-01-17 DIAGNOSIS — D485 Neoplasm of uncertain behavior of skin: Secondary | ICD-10-CM | POA: Diagnosis not present

## 2014-01-17 DIAGNOSIS — Z85820 Personal history of malignant melanoma of skin: Secondary | ICD-10-CM | POA: Diagnosis not present

## 2014-01-17 DIAGNOSIS — Z85828 Personal history of other malignant neoplasm of skin: Secondary | ICD-10-CM | POA: Diagnosis not present

## 2014-02-13 DIAGNOSIS — D0439 Carcinoma in situ of skin of other parts of face: Secondary | ICD-10-CM | POA: Diagnosis not present

## 2014-02-13 DIAGNOSIS — L821 Other seborrheic keratosis: Secondary | ICD-10-CM | POA: Diagnosis not present

## 2014-02-13 DIAGNOSIS — D2370 Other benign neoplasm of skin of unspecified lower limb, including hip: Secondary | ICD-10-CM | POA: Diagnosis not present

## 2014-02-13 DIAGNOSIS — L820 Inflamed seborrheic keratosis: Secondary | ICD-10-CM | POA: Diagnosis not present

## 2014-02-13 DIAGNOSIS — D0430 Carcinoma in situ of skin of unspecified part of face: Secondary | ICD-10-CM | POA: Diagnosis not present

## 2014-02-13 DIAGNOSIS — L570 Actinic keratosis: Secondary | ICD-10-CM | POA: Diagnosis not present

## 2014-02-13 DIAGNOSIS — Z85820 Personal history of malignant melanoma of skin: Secondary | ICD-10-CM | POA: Diagnosis not present

## 2014-02-13 DIAGNOSIS — Z85828 Personal history of other malignant neoplasm of skin: Secondary | ICD-10-CM | POA: Diagnosis not present

## 2014-02-13 DIAGNOSIS — D239 Other benign neoplasm of skin, unspecified: Secondary | ICD-10-CM | POA: Diagnosis not present

## 2014-04-04 ENCOUNTER — Other Ambulatory Visit: Payer: Self-pay

## 2014-04-10 ENCOUNTER — Other Ambulatory Visit: Payer: Self-pay | Admitting: Dermatology

## 2014-04-10 DIAGNOSIS — D485 Neoplasm of uncertain behavior of skin: Secondary | ICD-10-CM | POA: Diagnosis not present

## 2014-04-10 DIAGNOSIS — Z85820 Personal history of malignant melanoma of skin: Secondary | ICD-10-CM | POA: Diagnosis not present

## 2014-04-10 DIAGNOSIS — Z85828 Personal history of other malignant neoplasm of skin: Secondary | ICD-10-CM | POA: Diagnosis not present

## 2014-04-10 DIAGNOSIS — L821 Other seborrheic keratosis: Secondary | ICD-10-CM | POA: Diagnosis not present

## 2014-04-10 DIAGNOSIS — L720 Epidermal cyst: Secondary | ICD-10-CM | POA: Diagnosis not present

## 2014-05-21 DIAGNOSIS — Z85820 Personal history of malignant melanoma of skin: Secondary | ICD-10-CM | POA: Diagnosis not present

## 2014-05-21 DIAGNOSIS — L820 Inflamed seborrheic keratosis: Secondary | ICD-10-CM | POA: Diagnosis not present

## 2014-05-21 DIAGNOSIS — Z85828 Personal history of other malignant neoplasm of skin: Secondary | ICD-10-CM | POA: Diagnosis not present

## 2014-05-21 DIAGNOSIS — L570 Actinic keratosis: Secondary | ICD-10-CM | POA: Diagnosis not present

## 2014-06-17 ENCOUNTER — Encounter: Payer: Medicare Other | Admitting: Internal Medicine

## 2014-07-09 DIAGNOSIS — H52203 Unspecified astigmatism, bilateral: Secondary | ICD-10-CM | POA: Diagnosis not present

## 2014-07-09 DIAGNOSIS — H532 Diplopia: Secondary | ICD-10-CM | POA: Diagnosis not present

## 2014-07-09 DIAGNOSIS — Z961 Presence of intraocular lens: Secondary | ICD-10-CM | POA: Diagnosis not present

## 2014-07-23 ENCOUNTER — Encounter: Payer: Self-pay | Admitting: Internal Medicine

## 2014-07-23 ENCOUNTER — Encounter: Payer: Medicare Other | Admitting: Internal Medicine

## 2014-07-23 ENCOUNTER — Other Ambulatory Visit (INDEPENDENT_AMBULATORY_CARE_PROVIDER_SITE_OTHER): Payer: Medicare Other

## 2014-07-23 ENCOUNTER — Other Ambulatory Visit: Payer: Self-pay | Admitting: Internal Medicine

## 2014-07-23 ENCOUNTER — Ambulatory Visit (INDEPENDENT_AMBULATORY_CARE_PROVIDER_SITE_OTHER): Payer: Medicare Other | Admitting: Internal Medicine

## 2014-07-23 VITALS — BP 122/80 | HR 58 | Temp 97.7°F | Ht 68.0 in | Wt 140.5 lb

## 2014-07-23 DIAGNOSIS — R7302 Impaired glucose tolerance (oral): Secondary | ICD-10-CM

## 2014-07-23 DIAGNOSIS — R7989 Other specified abnormal findings of blood chemistry: Secondary | ICD-10-CM | POA: Diagnosis not present

## 2014-07-23 DIAGNOSIS — E785 Hyperlipidemia, unspecified: Secondary | ICD-10-CM | POA: Diagnosis not present

## 2014-07-23 DIAGNOSIS — Z0000 Encounter for general adult medical examination without abnormal findings: Secondary | ICD-10-CM

## 2014-07-23 LAB — HEMOGLOBIN A1C: Hgb A1c MFr Bld: 5.6 % (ref 4.6–6.5)

## 2014-07-23 LAB — BASIC METABOLIC PANEL
BUN: 18 mg/dL (ref 6–23)
CALCIUM: 9.2 mg/dL (ref 8.4–10.5)
CHLORIDE: 105 meq/L (ref 96–112)
CO2: 31 mEq/L (ref 19–32)
CREATININE: 1 mg/dL (ref 0.40–1.50)
GFR: 75.26 mL/min (ref >60.00)
GLUCOSE: 89 mg/dL (ref 70–99)
Potassium: 4.2 mEq/L (ref 3.5–5.1)
SODIUM: 140 meq/L (ref 135–145)

## 2014-07-23 LAB — CBC WITH DIFFERENTIAL/PLATELET
BASOS ABS: 0 10*3/uL (ref 0.0–0.1)
Basophils Relative: 0.4 % (ref 0.0–3.0)
Eosinophils Absolute: 0.1 10*3/uL (ref 0.0–0.7)
Eosinophils Relative: 1 % (ref 0.0–5.0)
HCT: 44.4 % (ref 39.0–52.0)
HEMOGLOBIN: 15.1 g/dL (ref 13.0–17.0)
Lymphocytes Relative: 24 % (ref 12.0–46.0)
Lymphs Abs: 1.3 10*3/uL (ref 0.7–4.0)
MCHC: 34 g/dL (ref 30.0–36.0)
MCV: 89.5 fl (ref 78.0–100.0)
Monocytes Absolute: 0.5 10*3/uL (ref 0.1–1.0)
Monocytes Relative: 9.8 % (ref 3.0–12.0)
NEUTROS PCT: 64.8 % (ref 43.0–77.0)
Neutro Abs: 3.4 10*3/uL (ref 1.4–7.7)
PLATELETS: 140 10*3/uL — AB (ref 150.0–400.0)
RBC: 4.96 Mil/uL (ref 4.22–5.81)
RDW: 13.5 % (ref 11.5–15.5)
WBC: 5.2 10*3/uL (ref 4.0–10.5)

## 2014-07-23 LAB — URINALYSIS, ROUTINE W REFLEX MICROSCOPIC
Bilirubin Urine: NEGATIVE
Hgb urine dipstick: NEGATIVE
Ketones, ur: NEGATIVE
LEUKOCYTES UA: NEGATIVE
NITRITE: NEGATIVE
RBC / HPF: NONE SEEN (ref 0–?)
Specific Gravity, Urine: 1.02 (ref 1.000–1.030)
TOTAL PROTEIN, URINE-UPE24: NEGATIVE
URINE GLUCOSE: NEGATIVE
UROBILINOGEN UA: 0.2 (ref 0.0–1.0)
pH: 7 (ref 5.0–8.0)

## 2014-07-23 LAB — T4, FREE: Free T4: 0.81 ng/dL (ref 0.60–1.60)

## 2014-07-23 LAB — HEPATIC FUNCTION PANEL
ALK PHOS: 62 U/L (ref 39–117)
ALT: 12 U/L (ref 0–53)
AST: 17 U/L (ref 0–37)
Albumin: 4.2 g/dL (ref 3.5–5.2)
Bilirubin, Direct: 0.2 mg/dL (ref 0.0–0.3)
Total Bilirubin: 1 mg/dL (ref 0.2–1.2)
Total Protein: 6.4 g/dL (ref 6.0–8.3)

## 2014-07-23 LAB — LIPID PANEL
Cholesterol: 165 mg/dL (ref 0–200)
HDL: 65.9 mg/dL (ref >39.00)
LDL Cholesterol: 85 mg/dL (ref 0–99)
NonHDL: 99.1
Total CHOL/HDL Ratio: 3
Triglycerides: 73 mg/dL (ref 0.0–149.0)
VLDL: 14.6 mg/dL (ref 0.0–40.0)

## 2014-07-23 LAB — TSH: TSH: 5.98 u[IU]/mL — AB (ref 0.35–4.50)

## 2014-07-23 MED ORDER — LEVOTHYROXINE SODIUM 25 MCG PO TABS: 25.0000 ug | ORAL_TABLET | Freq: Every day | ORAL | Status: DC

## 2014-07-23 NOTE — Progress Notes (Signed)
Pre visit review using our clinic review tool, if applicable. No additional management support is needed unless otherwise documented below in the visit note. 

## 2014-07-23 NOTE — Assessment & Plan Note (Signed)
?   New low thyroid - also for tsh, free t4

## 2014-07-23 NOTE — Assessment & Plan Note (Signed)
stable overall by history and exam, recent data reviewed with pt, and pt to continue medical treatment as before,  to f/u any worsening symptoms or concerns Lab Results  Component Value Date   HGBA1C 5.4 06/11/2013   For f/u a1c

## 2014-07-23 NOTE — Progress Notes (Signed)
Subjective:    Patient ID: Jason Barton, male    DOB: 28-Aug-1925, 81 y.o.   MRN: [id number]  HPI Here for wellness and f/u;  Overall doing ok;  Pt denies CP, worsening SOB, DOE, wheezing, orthopnea, PND, worsening LE edema, palpitations, dizziness or syncope.  Pt denies neurological change such as new headache, facial or extremity weakness.  Pt denies polydipsia, polyuria, or low sugar symptoms. Pt states overall good compliance with treatment and medications, good tolerability, and has been trying to follow lower cholesterol diet.  Pt denies worsening depressive symptoms, suicidal ideation or panic. No fever, night sweats, wt loss, loss of appetite, or other constitutional symptoms.  Pt states good ability with ADL's, has low fall risk, home safety reviewed and adequate, no other significant changes in hearing or vision, and only occasionally active with exercise.  No current complaints Past Medical History  Diagnosis Date  . Nonmelanoma skin cancer 05/21/2011  . S/P inguinal hernia repair 05/21/2011  . S/P appendectomy 05/21/2011  . S/p small bowel obstruction 05/21/2011  . Diverticulosis of colon 05/21/2011  . COPD (chronic obstructive pulmonary disease) 05/21/2011  . Thoracic scoliosis 05/21/2011  . Increased prostate specific antigen (PSA) velocity 05/24/2011  . Impaired glucose tolerance 06/08/2012   Past Surgical History  Procedure Laterality Date  . Appendectomy  1939  . Tonsillectomy  1942  . Shoulder surgery      dr sypher; rotater cuff    reports that he has never smoked. He does not have any smokeless tobacco history on file. He reports that he does not drink alcohol or use illicit drugs. family history includes Arthritis in his other; Sudden death in his other. No Known Allergies Current Outpatient Prescriptions on File Prior to Visit  Medication Sig Dispense Refill  . Cholecalciferol 2000 UNITS CAPS Take by mouth 2 (two) times daily.     . Cholecalciferol 4000 UNITS CAPS  Take by mouth 2 (two) times daily.    Marland Kitchen Specialty Vitamins Products (PROSTATE PO) Take by mouth daily.      Marland Kitchen Ubiquinol 50 MG CAPS Take by mouth 2 (two) times daily.       No current facility-administered medications on file prior to visit.   Review of Systems Constitutional: Negative for increased diaphoresis, other activity, appetite or other siginficant weight change  HENT: Negative for worsening hearing loss, ear pain, facial swelling, mouth sores and neck stiffness.   Eyes: Negative for other worsening pain, redness or visual disturbance.  Respiratory: Negative for shortness of breath and wheezing.   Cardiovascular: Negative for chest pain and palpitations.  Gastrointestinal: Negative for diarrhea, blood in stool, abdominal distention or other pain Genitourinary: Negative for hematuria, flank pain or change in urine volume.  Musculoskeletal: Negative for myalgias or other joint complaints.  Skin: Negative for color change and wound.  Neurological: Negative for syncope and numbness. other than noted Hematological: Negative for adenopathy. or other swelling Psychiatric/Behavioral: Negative for hallucinations, self-injury, decreased concentration or other worsening agitation.      Objective:   Physical Exam BP 122/80 mmHg  Pulse 58  Temp(Src) 97.7 F (36.5 C) (Oral)  Ht 5\' 8"  (1.727 m)  Wt 140 lb 8 oz (63.73 kg)  BMI 21.37 kg/m2  SpO2 96% VS noted,  Constitutional: Pt is oriented to person, place, and time. Appears well-developed and well-nourished.  Head: Normocephalic and atraumatic.  Right Ear: External ear normal.  Left Ear: External ear normal.  Nose: Nose normal.  Mouth/Throat: Oropharynx is clear and  moist.  Eyes: Conjunctivae and EOM are normal. Pupils are equal, round, and reactive to light.  Neck: Normal range of motion. Neck supple. No JVD present. No tracheal deviation present.  Cardiovascular: Normal rate, regular rhythm, normal heart sounds and intact distal  pulses.   Pulmonary/Chest: Effort normal and breath sounds without rales or wheezing  Abdominal: Soft. Bowel sounds are normal. NT. No HSM  Musculoskeletal: Normal range of motion. Exhibits no edema.  Lymphadenopathy:  Has no cervical adenopathy.  Neurological: Pt is alert and oriented to person, place, and time. Pt has normal reflexes. No cranial nerve deficit. Motor grossly intact Skin: Skin is warm and dry. No rash noted.  Psychiatric:  Has normal mood and affect. Behavior is normal.   Wt Readings from Last 3 Encounters:  07/23/14 140 lb 8 oz (63.73 kg)  10/01/13 149 lb 6 oz (67.756 kg)  06/11/13 148 lb 2 oz (67.189 kg)       Assessment & Plan:

## 2014-07-23 NOTE — Assessment & Plan Note (Signed)

## 2014-07-23 NOTE — Patient Instructions (Addendum)
Please continue all other medications as before, and refills have been done if requested.  Please have the pharmacy call with any other refills you may need.  Please continue your efforts at being more active, low cholesterol diet, and weight control.  You are otherwise up to date with prevention measures today.  Please keep your appointments with your specialists as you may have planned  Please go to the LAB in the Basement (turn left off the elevator) for the tests to be done today  You will be contacted by phone if any changes need to be made immediately.  Otherwise, you will receive a letter about your results with an explanation, but please check with MyChart first.  Please remember to sign up for MyChart if you have not done so, as this will be important to you in the future with finding out test results, communicating by private email, and scheduling acute appointments online when needed.  Please return in 1 year for your yearly visit, or sooner if needed, with Lab testing done 3-5 days before  Please call for a Nurse Appointment for the shingles shot if this is covered by your insurance

## 2014-07-24 ENCOUNTER — Ambulatory Visit (INDEPENDENT_AMBULATORY_CARE_PROVIDER_SITE_OTHER): Payer: Medicare Other | Admitting: *Deleted

## 2014-07-24 DIAGNOSIS — Z23 Encounter for immunization: Secondary | ICD-10-CM

## 2014-08-01 DIAGNOSIS — L570 Actinic keratosis: Secondary | ICD-10-CM | POA: Diagnosis not present

## 2014-08-01 DIAGNOSIS — D225 Melanocytic nevi of trunk: Secondary | ICD-10-CM | POA: Diagnosis not present

## 2014-08-01 DIAGNOSIS — L821 Other seborrheic keratosis: Secondary | ICD-10-CM | POA: Diagnosis not present

## 2014-08-01 DIAGNOSIS — L820 Inflamed seborrheic keratosis: Secondary | ICD-10-CM | POA: Diagnosis not present

## 2014-08-01 DIAGNOSIS — Z85828 Personal history of other malignant neoplasm of skin: Secondary | ICD-10-CM | POA: Diagnosis not present

## 2014-08-01 DIAGNOSIS — Z85820 Personal history of malignant melanoma of skin: Secondary | ICD-10-CM | POA: Diagnosis not present

## 2014-08-11 DIAGNOSIS — H4912 Fourth [trochlear] nerve palsy, left eye: Secondary | ICD-10-CM | POA: Diagnosis not present

## 2014-08-20 ENCOUNTER — Other Ambulatory Visit: Payer: Self-pay | Admitting: Dermatology

## 2014-08-20 DIAGNOSIS — Z85828 Personal history of other malignant neoplasm of skin: Secondary | ICD-10-CM | POA: Diagnosis not present

## 2014-08-20 DIAGNOSIS — D485 Neoplasm of uncertain behavior of skin: Secondary | ICD-10-CM | POA: Diagnosis not present

## 2014-08-20 DIAGNOSIS — D1801 Hemangioma of skin and subcutaneous tissue: Secondary | ICD-10-CM | POA: Diagnosis not present

## 2014-08-20 DIAGNOSIS — L821 Other seborrheic keratosis: Secondary | ICD-10-CM | POA: Diagnosis not present

## 2014-08-20 DIAGNOSIS — C44729 Squamous cell carcinoma of skin of left lower limb, including hip: Secondary | ICD-10-CM | POA: Diagnosis not present

## 2014-08-20 DIAGNOSIS — L570 Actinic keratosis: Secondary | ICD-10-CM | POA: Diagnosis not present

## 2014-08-20 DIAGNOSIS — D2272 Melanocytic nevi of left lower limb, including hip: Secondary | ICD-10-CM | POA: Diagnosis not present

## 2014-08-20 DIAGNOSIS — C44722 Squamous cell carcinoma of skin of right lower limb, including hip: Secondary | ICD-10-CM | POA: Diagnosis not present

## 2014-08-20 DIAGNOSIS — D225 Melanocytic nevi of trunk: Secondary | ICD-10-CM | POA: Diagnosis not present

## 2014-08-20 DIAGNOSIS — Z85820 Personal history of malignant melanoma of skin: Secondary | ICD-10-CM | POA: Diagnosis not present

## 2014-11-07 DIAGNOSIS — H4912 Fourth [trochlear] nerve palsy, left eye: Secondary | ICD-10-CM | POA: Diagnosis not present

## 2014-11-11 DIAGNOSIS — H0231 Blepharochalasis right upper eyelid: Secondary | ICD-10-CM | POA: Diagnosis not present

## 2014-11-11 DIAGNOSIS — Z79899 Other long term (current) drug therapy: Secondary | ICD-10-CM | POA: Diagnosis not present

## 2014-11-11 DIAGNOSIS — H0233 Blepharochalasis right eye, unspecified eyelid: Secondary | ICD-10-CM | POA: Diagnosis not present

## 2014-11-11 DIAGNOSIS — H0230 Blepharochalasis unspecified eye, unspecified eyelid: Secondary | ICD-10-CM | POA: Insufficient documentation

## 2014-11-11 DIAGNOSIS — H532 Diplopia: Secondary | ICD-10-CM | POA: Diagnosis not present

## 2014-11-11 DIAGNOSIS — H02833 Dermatochalasis of right eye, unspecified eyelid: Secondary | ICD-10-CM | POA: Diagnosis not present

## 2014-11-11 DIAGNOSIS — Z961 Presence of intraocular lens: Secondary | ICD-10-CM | POA: Diagnosis not present

## 2014-11-11 DIAGNOSIS — H0236 Blepharochalasis left eye, unspecified eyelid: Secondary | ICD-10-CM | POA: Diagnosis not present

## 2014-11-11 DIAGNOSIS — H02836 Dermatochalasis of left eye, unspecified eyelid: Secondary | ICD-10-CM | POA: Diagnosis not present

## 2014-11-11 DIAGNOSIS — H0234 Blepharochalasis left upper eyelid: Secondary | ICD-10-CM | POA: Diagnosis not present

## 2014-11-19 DIAGNOSIS — H0234 Blepharochalasis left upper eyelid: Secondary | ICD-10-CM | POA: Diagnosis not present

## 2014-11-19 DIAGNOSIS — H0236 Blepharochalasis left eye, unspecified eyelid: Secondary | ICD-10-CM | POA: Diagnosis not present

## 2014-11-20 ENCOUNTER — Encounter (HOSPITAL_BASED_OUTPATIENT_CLINIC_OR_DEPARTMENT_OTHER): Payer: Self-pay | Admitting: *Deleted

## 2014-11-20 ENCOUNTER — Ambulatory Visit: Payer: Self-pay | Admitting: Ophthalmology

## 2014-11-20 NOTE — H&P (Signed)
  Date of examination:  11-11-14  Indication for surgery: to straighten the eyes and relieve diplopia  Pertinent past medical history:  Past Medical History  Diagnosis Date  . Nonmelanoma skin cancer 05/21/2011  . S/P inguinal hernia repair 05/21/2011  . S/P appendectomy 05/21/2011  . S/p small bowel obstruction 05/21/2011  . Diverticulosis of colon 05/21/2011  . COPD (chronic obstructive pulmonary disease) 05/21/2011  . Thoracic scoliosis 05/21/2011  . Increased prostate specific antigen (PSA) velocity 05/24/2011  . Impaired glucose tolerance 06/08/2012  . Hypothyroidism     Pertinent ocular history:  Diplopia vertical/slanted x 6 years, seen by Hassell Done who dx'd L 4th  Pertinent family history:  Family History  Problem Relation Age of Onset  . Arthritis Other   . Sudden death Other     General:  Healthy appearing patient in no distress.    Eyes:    Acuity cc OD 20/30  OS 20/30  External: Within normal limits   + R tilt  Anterior segment: Within normal limits x   PCIOL OU  Motility:   LH(T)=4.  Increases in L gaze and downgaze and on L tilt.  E(T)=4 + "V".  Maddox rod LHT 5-6.  DMR 3 deg excyclo.   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 Partial tenotomy RIR from temporal end of insertion  Dandrea Widdowson O

## 2014-11-21 ENCOUNTER — Ambulatory Visit (HOSPITAL_BASED_OUTPATIENT_CLINIC_OR_DEPARTMENT_OTHER): Payer: Medicare Other | Admitting: Certified Registered"

## 2014-11-21 ENCOUNTER — Encounter (HOSPITAL_BASED_OUTPATIENT_CLINIC_OR_DEPARTMENT_OTHER)
Admission: RE | Disposition: A | Discharge status: Home / Self Care | Payer: Self-pay | Source: Ambulatory Visit | Attending: Ophthalmology

## 2014-11-21 ENCOUNTER — Encounter (HOSPITAL_BASED_OUTPATIENT_CLINIC_OR_DEPARTMENT_OTHER): Payer: Self-pay | Admitting: Certified Registered"

## 2014-11-21 ENCOUNTER — Ambulatory Visit (HOSPITAL_BASED_OUTPATIENT_CLINIC_OR_DEPARTMENT_OTHER)
Admission: RE | Admit: 2014-11-21 | Discharge: 2014-11-21 | Disposition: A | Discharge status: Home / Self Care | Payer: Medicare Other | Source: Ambulatory Visit | Attending: Ophthalmology | Admitting: Ophthalmology

## 2014-11-21 DIAGNOSIS — E039 Hypothyroidism, unspecified: Secondary | ICD-10-CM | POA: Insufficient documentation

## 2014-11-21 DIAGNOSIS — H4912 Fourth [trochlear] nerve palsy, left eye: Secondary | ICD-10-CM | POA: Diagnosis not present

## 2014-11-21 DIAGNOSIS — Z85828 Personal history of other malignant neoplasm of skin: Secondary | ICD-10-CM | POA: Insufficient documentation

## 2014-11-21 DIAGNOSIS — H4911 Fourth [trochlear] nerve palsy, right eye: Secondary | ICD-10-CM | POA: Diagnosis not present

## 2014-11-21 DIAGNOSIS — J449 Chronic obstructive pulmonary disease, unspecified: Secondary | ICD-10-CM | POA: Diagnosis not present

## 2014-11-21 HISTORY — DX: Hypothyroidism, unspecified: E03.9

## 2014-11-21 HISTORY — PX: STRABISMUS SURGERY: SHX218

## 2014-11-21 LAB — POCT HEMOGLOBIN-HEMACUE: HEMOGLOBIN: 15 g/dL (ref 13.0–17.0)

## 2014-11-21 SURGERY — REPAIR STRABISMUS
Anesthesia: General | Site: Eye | Laterality: Right

## 2014-11-21 MED ORDER — MIDAZOLAM HCL 2 MG/2ML IJ SOLN
INTRAMUSCULAR | Status: AC
  Filled 2014-11-21: qty 2

## 2014-11-21 MED ORDER — LIDOCAINE HCL (CARDIAC) 20 MG/ML IV SOLN
INTRAVENOUS | Status: DC | PRN
  Administered 2014-11-21: 60 mg via INTRAVENOUS

## 2014-11-21 MED ORDER — FENTANYL CITRATE (PF) 100 MCG/2ML IJ SOLN
INTRAMUSCULAR | Status: AC
  Filled 2014-11-21: qty 4

## 2014-11-21 MED ORDER — OXYCODONE HCL 5 MG/5ML PO SOLN
5.0000 mg | Freq: Once | ORAL | Status: DC | PRN
Start: 2014-11-21 — End: 2014-11-21

## 2014-11-21 MED ORDER — FENTANYL CITRATE (PF) 100 MCG/2ML IJ SOLN: 25.0000 ug | INTRAMUSCULAR | Status: DC | PRN

## 2014-11-21 MED ORDER — ONDANSETRON HCL 4 MG/2ML IJ SOLN
INTRAMUSCULAR | Status: DC | PRN
  Administered 2014-11-21: 4 mg via INTRAVENOUS

## 2014-11-21 MED ORDER — BSS IO SOLN
INTRAOCULAR | Status: AC
  Filled 2014-11-21: qty 15

## 2014-11-21 MED ORDER — MIDAZOLAM HCL 2 MG/2ML IJ SOLN
1.0000 mg | INTRAMUSCULAR | Status: DC | PRN
Start: 2014-11-21 — End: 2014-11-21

## 2014-11-21 MED ORDER — OXYCODONE-ACETAMINOPHEN 7.5-325 MG PO TABS: 1.0000 | ORAL_TABLET | ORAL | Status: DC | PRN

## 2014-11-21 MED ORDER — FENTANYL CITRATE (PF) 100 MCG/2ML IJ SOLN: 50.0000 ug | INTRAMUSCULAR | Status: DC | PRN

## 2014-11-21 MED ORDER — TOBRAMYCIN-DEXAMETHASONE 0.3-0.1 % OP OINT
TOPICAL_OINTMENT | OPHTHALMIC | Status: DC | PRN
  Administered 2014-11-21: 1 via OPHTHALMIC

## 2014-11-21 MED ORDER — DEXAMETHASONE SODIUM PHOSPHATE 4 MG/ML IJ SOLN
INTRAMUSCULAR | Status: DC | PRN
  Administered 2014-11-21: 5 mg via INTRAVENOUS

## 2014-11-21 MED ORDER — TOBRAMYCIN-DEXAMETHASONE 0.3-0.1 % OP OINT: 1.0000 "application " | TOPICAL_OINTMENT | Freq: Two times a day (BID) | OPHTHALMIC | Status: DC

## 2014-11-21 MED ORDER — LACTATED RINGERS IV SOLN
INTRAVENOUS | Status: DC
  Administered 2014-11-21: 09:00:00 via INTRAVENOUS
  Administered 2014-11-21: 10 mL/h via INTRAVENOUS

## 2014-11-21 MED ORDER — GLYCOPYRROLATE 0.2 MG/ML IJ SOLN
0.2000 mg | Freq: Once | INTRAMUSCULAR | Status: AC | PRN
  Administered 2014-11-21: 0.2 mg via INTRAVENOUS

## 2014-11-21 MED ORDER — OXYCODONE HCL 5 MG PO TABS
5.0000 mg | ORAL_TABLET | Freq: Once | ORAL | Status: DC | PRN
Start: 2014-11-21 — End: 2014-11-21

## 2014-11-21 MED ORDER — PROPOFOL 10 MG/ML IV BOLUS
INTRAVENOUS | Status: DC | PRN
  Administered 2014-11-21: 140 mg via INTRAVENOUS

## 2014-11-21 MED ORDER — FENTANYL CITRATE (PF) 100 MCG/2ML IJ SOLN
INTRAMUSCULAR | Status: DC | PRN
  Administered 2014-11-21 (×2): 25 ug via INTRAVENOUS

## 2014-11-21 MED ORDER — ONDANSETRON HCL 4 MG/2ML IJ SOLN: 4.0000 mg | Freq: Four times a day (QID) | INTRAMUSCULAR | Status: DC | PRN

## 2014-11-21 SURGICAL SUPPLY — 32 items
APL SRG 3 HI ABS STRL LF PLS (MISCELLANEOUS) ×1
APPLICATOR COTTON TIP 6IN STRL (MISCELLANEOUS) ×12 IMPLANT
APPLICATOR DR MATTHEWS STRL (MISCELLANEOUS) ×3 IMPLANT
BANDAGE EYE OVAL (MISCELLANEOUS) IMPLANT
CAUTERY EYE LOW TEMP 1300F FIN (OPHTHALMIC RELATED) IMPLANT
CLOSURE WOUND 1/4X4 (GAUZE/BANDAGES/DRESSINGS)
COVER BACK TABLE 60X90IN (DRAPES) ×3 IMPLANT
COVER MAYO STAND STRL (DRAPES) ×3 IMPLANT
DRAPE SURG 17X23 STRL (DRAPES) ×6 IMPLANT
DRAPE U-SHAPE 76X120 STRL (DRAPES) ×2 IMPLANT
GLOVE BIO SURGEON STRL SZ 6.5 (GLOVE) ×2 IMPLANT
GLOVE BIO SURGEONS STRL SZ 6.5 (GLOVE) ×1
GLOVE BIOGEL M STRL SZ7.5 (GLOVE) ×6 IMPLANT
GOWN STRL REUS W/ TWL LRG LVL3 (GOWN DISPOSABLE) ×1 IMPLANT
GOWN STRL REUS W/TWL LRG LVL3 (GOWN DISPOSABLE) ×3
GOWN STRL REUS W/TWL XL LVL3 (GOWN DISPOSABLE) ×3 IMPLANT
NS IRRIG 1000ML POUR BTL (IV SOLUTION) ×3 IMPLANT
PACK BASIN DAY SURGERY FS (CUSTOM PROCEDURE TRAY) ×3 IMPLANT
SHEET MEDIUM DRAPE 40X70 STRL (DRAPES) IMPLANT
SLEEVE SCD COMPRESS KNEE MED (MISCELLANEOUS) ×3 IMPLANT
SPEAR EYE SURG WECK-CEL (MISCELLANEOUS) ×6 IMPLANT
STRIP CLOSURE SKIN 1/4X4 (GAUZE/BANDAGES/DRESSINGS) IMPLANT
SUT 6 0 SILK T G140 8DA (SUTURE) ×2 IMPLANT
SUT MERSILENE 6 0 S14 DA (SUTURE) IMPLANT
SUT PLAIN 6 0 TG1408 (SUTURE) ×2 IMPLANT
SUT SILK 4 0 C 3 735G (SUTURE) IMPLANT
SUT VICRYL 6 0 S 28 (SUTURE) IMPLANT
SUT VICRYL ABS 6-0 S29 18IN (SUTURE) IMPLANT
SYR TB 1ML LL NO SAFETY (SYRINGE) ×3 IMPLANT
SYRINGE 10CC LL (SYRINGE) ×3 IMPLANT
TOWEL OR 17X24 6PK STRL BLUE (TOWEL DISPOSABLE) ×3 IMPLANT
TRAY DSU PREP LF (CUSTOM PROCEDURE TRAY) ×3 IMPLANT

## 2014-11-21 NOTE — Anesthesia Preprocedure Evaluation (Signed)
Anesthesia Evaluation  Patient identified by MRN, date of birth, ID band Patient awake    Reviewed: Allergy & Precautions, NPO status , Patient's Chart, lab work & pertinent test results  Airway Mallampati: II   Neck ROM: full    Dental   Pulmonary COPD breath sounds clear to auscultation        Cardiovascular negative cardio ROS  Rhythm:regular Rate:Normal     Neuro/Psych    GI/Hepatic   Endo/Other  Hypothyroidism   Renal/GU      Musculoskeletal   Abdominal   Peds  Hematology   Anesthesia Other Findings   Reproductive/Obstetrics                             Anesthesia Physical Anesthesia Plan  ASA: II  Anesthesia Plan: General   Post-op Pain Management:    Induction: Intravenous  Airway Management Planned: LMA  Additional Equipment:   Intra-op Plan:   Post-operative Plan:   Informed Consent: I have reviewed the patients History and Physical, chart, labs and discussed the procedure including the risks, benefits and alternatives for the proposed anesthesia with the patient or authorized representative who has indicated his/her understanding and acceptance.     Plan Discussed with: CRNA, Anesthesiologist and Surgeon  Anesthesia Plan Comments:         Anesthesia Quick Evaluation

## 2014-11-21 NOTE — Discharge Instructions (Signed)
Diet: Clear liquids, advance to soft foods then regular diet as tolerated.  Pain control:   1)  Ibuprofen 600 mg by mouth every 6-8 hours as needed for pain  2)  Percocet 7.5/325 one or two by mouth as needed every 4-6 hours as needed  for pain that is not resolved by ibuprofen  Eye medications:  Tobradex eye ointment--Dr. Annamaria Boots will talk with you tonight re: whether/when to use  Activity: No swimming for 1 week.  It is OK to let water run over the face and eyes while showering or taking a bath, even during the first week.  No other restriction on exercise or activity.  Call Dr. Janee Morn office [phone] with any problems or concerns.   Post Anesthesia Home Care Instructions  Activity: Get plenty of rest for the remainder of the day. A responsible adult should stay with you for 24 hours following the procedure.  For the next 24 hours, DO NOT: -Drive a car -Paediatric nurse -Drink alcoholic beverages -Take any medication unless instructed by your physician -Make any legal decisions or sign important papers.  Meals: Start with liquid foods such as gelatin or soup. Progress to regular foods as tolerated. Avoid greasy, spicy, heavy foods. If nausea and/or vomiting occur, drink only clear liquids until the nausea and/or vomiting subsides. Call your physician if vomiting continues.  Special Instructions/Symptoms: Your throat may feel dry or sore from the anesthesia or the breathing tube placed in your throat during surgery. If this causes discomfort, gargle with warm salt water. The discomfort should disappear within 24 hours.  If you had a scopolamine patch placed behind your ear for the management of post- operative nausea and/or vomiting:  1. The medication in the patch is effective for 72 hours, after which it should be removed.  Wrap patch in a tissue and discard in the trash. Wash hands thoroughly with soap and water. 2. You may remove the patch earlier than 72 hours if you  experience unpleasant side effects which may include dry mouth, dizziness or visual disturbances. 3. Avoid touching the patch. Wash your hands with soap and water after contact with the patch.

## 2014-11-21 NOTE — Anesthesia Procedure Notes (Signed)
Procedure Name: LMA Insertion Date/Time: 11/21/2014 10:12 AM Performed by: Baxter Flattery Pre-anesthesia Checklist: Patient identified, Emergency Drugs available, Suction available and Patient being monitored Patient Re-evaluated:Patient Re-evaluated prior to inductionOxygen Delivery Method: Circle System Utilized Preoxygenation: Pre-oxygenation with 100% oxygen Intubation Type: IV induction Ventilation: Mask ventilation without difficulty LMA: LMA flexible inserted LMA Size: 4.0 Number of attempts: 1 Airway Equipment and Method: Bite block Placement Confirmation: positive ETCO2 and breath sounds checked- equal and bilateral Tube secured with: Tape Dental Injury: Teeth and Oropharynx as per pre-operative assessment

## 2014-11-21 NOTE — Transfer of Care (Signed)
Immediate Anesthesia Transfer of Care Note  Patient: Jason Barton  Procedure(s) Performed: Procedure(s): REPAIR STRABISMUS RIGHT EYE  (Right)  Patient Location: PACU  Anesthesia Type:General  Level of Consciousness: awake, sedated and responds to stimulation  Airway & Oxygen Therapy: Patient Spontanous Breathing and Patient connected to face mask oxygen  Post-op Assessment: Report given to RN, Post -op Vital signs reviewed and stable and Patient moving all extremities  Post vital signs: Reviewed and stable  Last Vitals:  Filed Vitals:   11/21/14 1045  BP:   Pulse: 84  Temp:   Resp:     Complications: No apparent anesthesia complications

## 2014-11-21 NOTE — Op Note (Signed)
11/21/2014  10:40 AM  PATIENT:  Jason Barton    PRE-OPERATIVE DIAGNOSIS:  LEFT 4TH NERVE PALSY   POST-OPERATIVE DIAGNOSIS:  same  PROCEDURE:  Partial tenotomy, right inferior rectus muscle  SURGEON:  Derry Skill, MD  ANESTHESIA:   General  COMPLICATIONS: none  OPERATIVE PROCEDURE: After routine preoperative evaluation including informed consent, the patient was taken to the operative room where he was identified by me. Gen. Anesthesia was induced without difficulty after placement of appropriate monitors. The patient was prepped and draped in standard sterile fashion. A lid speculum was placed in the right eye.  A traction suture of 6-0 silk was placed at the inferior limbus, and this was used to draw the eye superiorly. A conjunctival incision was made directly over the insertion of the right inferior rectus muscle with Wescott scissors. The right inferior rectus muscle was engaged on a self-retaining hook. The insertion measured 9.5 mm in length. The nasal 1.0-1.5 mm of the insertion was grasped with a locking 0.5 mm forceps. Wescott scissors were used to disinsert the remainder of the inferior rectus muscle, beginning at the temporal end of the insertion. The locking 0.5 mm forceps was removed. Conjunctiva was closed with a single 6-0 plain gut suture. Tobrex ointment was placed in the eye. The traction suture was removed. The patient was awakened without difficulty and taken to the recovery room in stable condition, having suffered no intraoperative or immediate postoperative competitions.  Derry Skill, MD

## 2014-11-21 NOTE — Anesthesia Postprocedure Evaluation (Signed)
Anesthesia Post Note  Patient: Jason Barton  Procedure(s) Performed: Procedure(s) (LRB): REPAIR STRABISMUS RIGHT EYE  (Right)  Anesthesia type: General  Patient location: PACU  Post pain: Pain level controlled and Adequate analgesia  Post assessment: Post-op Vital signs reviewed, Patient's Cardiovascular Status Stable, Respiratory Function Stable, Patent Airway and Pain level controlled  Last Vitals:  Filed Vitals:   11/21/14 1143  BP: 157/69  Pulse: 79  Temp: 36.4 C  Resp: 16    Post vital signs: Reviewed and stable  Level of consciousness: awake, alert  and oriented  Complications: No apparent anesthesia complications

## 2014-11-21 NOTE — H&P (View-Only) (Signed)
  Date of examination:  11-11-14  Indication for surgery: to straighten the eyes and relieve diplopia  Pertinent past medical history:  Past Medical History  Diagnosis Date  . Nonmelanoma skin cancer 05/21/2011  . S/P inguinal hernia repair 05/21/2011  . S/P appendectomy 05/21/2011  . S/p small bowel obstruction 05/21/2011  . Diverticulosis of colon 05/21/2011  . COPD (chronic obstructive pulmonary disease) 05/21/2011  . Thoracic scoliosis 05/21/2011  . Increased prostate specific antigen (PSA) velocity 05/24/2011  . Impaired glucose tolerance 06/08/2012  . Hypothyroidism     Pertinent ocular history:  Diplopia vertical/slanted x 6 years, seen by Hassell Done who dx'd L 4th  Pertinent family history:  Family History  Problem Relation Age of Onset  . Arthritis Other   . Sudden death Other     General:  Healthy appearing patient in no distress.    Eyes:    Acuity cc OD 20/30  OS 20/30  External: Within normal limits   + R tilt  Anterior segment: Within normal limits x   PCIOL OU  Motility:   LH(T)=4.  Increases in L gaze and downgaze and on L tilt.  E(T)=4 + "V".  Maddox rod LHT 5-6.  DMR 3 deg excyclo.   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 Partial tenotomy RIR from temporal end of insertion  Jason Barton O

## 2014-11-21 NOTE — Interval H&P Note (Signed)
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 After consideration of risks, benefits and other options for treatment, the patient has consented to  Procedure(s): REPAIR STRABISMUS RIGHT EYE  (Right) as a surgical intervention .  The patient's history has been reviewed, patient examined, no change in status, stable for surgery.  I have reviewed the patient's chart and labs.  Questions were answered to the patient's satisfaction.     Derry Skill

## 2014-11-24 ENCOUNTER — Encounter (HOSPITAL_BASED_OUTPATIENT_CLINIC_OR_DEPARTMENT_OTHER): Payer: Self-pay | Admitting: Ophthalmology

## 2014-12-02 DIAGNOSIS — N401 Enlarged prostate with lower urinary tract symptoms: Secondary | ICD-10-CM | POA: Diagnosis not present

## 2014-12-02 DIAGNOSIS — R351 Nocturia: Secondary | ICD-10-CM | POA: Diagnosis not present

## 2014-12-24 DIAGNOSIS — L570 Actinic keratosis: Secondary | ICD-10-CM | POA: Diagnosis not present

## 2014-12-24 DIAGNOSIS — L821 Other seborrheic keratosis: Secondary | ICD-10-CM | POA: Diagnosis not present

## 2014-12-24 DIAGNOSIS — L720 Epidermal cyst: Secondary | ICD-10-CM | POA: Diagnosis not present

## 2014-12-24 DIAGNOSIS — Z85820 Personal history of malignant melanoma of skin: Secondary | ICD-10-CM | POA: Diagnosis not present

## 2014-12-24 DIAGNOSIS — Z85828 Personal history of other malignant neoplasm of skin: Secondary | ICD-10-CM | POA: Diagnosis not present

## 2015-02-24 DIAGNOSIS — Z01818 Encounter for other preprocedural examination: Secondary | ICD-10-CM | POA: Diagnosis not present

## 2015-02-24 DIAGNOSIS — H532 Diplopia: Secondary | ICD-10-CM | POA: Diagnosis not present

## 2015-02-24 DIAGNOSIS — H0236 Blepharochalasis left eye, unspecified eyelid: Secondary | ICD-10-CM | POA: Diagnosis not present

## 2015-02-24 DIAGNOSIS — Z961 Presence of intraocular lens: Secondary | ICD-10-CM | POA: Diagnosis not present

## 2015-02-24 DIAGNOSIS — Z9889 Other specified postprocedural states: Secondary | ICD-10-CM | POA: Diagnosis not present

## 2015-03-04 DIAGNOSIS — E039 Hypothyroidism, unspecified: Secondary | ICD-10-CM | POA: Diagnosis not present

## 2015-03-04 DIAGNOSIS — H0234 Blepharochalasis left upper eyelid: Secondary | ICD-10-CM | POA: Diagnosis not present

## 2015-03-04 DIAGNOSIS — Z79899 Other long term (current) drug therapy: Secondary | ICD-10-CM | POA: Diagnosis not present

## 2015-03-04 DIAGNOSIS — Z961 Presence of intraocular lens: Secondary | ICD-10-CM | POA: Diagnosis not present

## 2015-03-04 DIAGNOSIS — H532 Diplopia: Secondary | ICD-10-CM | POA: Diagnosis not present

## 2015-03-12 DIAGNOSIS — Z48810 Encounter for surgical aftercare following surgery on the sense organs: Secondary | ICD-10-CM | POA: Diagnosis not present

## 2015-03-27 DIAGNOSIS — L820 Inflamed seborrheic keratosis: Secondary | ICD-10-CM | POA: Diagnosis not present

## 2015-03-27 DIAGNOSIS — Z85820 Personal history of malignant melanoma of skin: Secondary | ICD-10-CM | POA: Diagnosis not present

## 2015-03-27 DIAGNOSIS — D2272 Melanocytic nevi of left lower limb, including hip: Secondary | ICD-10-CM | POA: Diagnosis not present

## 2015-03-27 DIAGNOSIS — L570 Actinic keratosis: Secondary | ICD-10-CM | POA: Diagnosis not present

## 2015-03-27 DIAGNOSIS — Z85828 Personal history of other malignant neoplasm of skin: Secondary | ICD-10-CM | POA: Diagnosis not present

## 2015-03-27 DIAGNOSIS — L821 Other seborrheic keratosis: Secondary | ICD-10-CM | POA: Diagnosis not present

## 2015-03-27 DIAGNOSIS — L905 Scar conditions and fibrosis of skin: Secondary | ICD-10-CM | POA: Diagnosis not present

## 2015-03-27 DIAGNOSIS — D1801 Hemangioma of skin and subcutaneous tissue: Secondary | ICD-10-CM | POA: Diagnosis not present

## 2015-03-30 DIAGNOSIS — L570 Actinic keratosis: Secondary | ICD-10-CM | POA: Diagnosis not present

## 2015-07-28 ENCOUNTER — Other Ambulatory Visit (INDEPENDENT_AMBULATORY_CARE_PROVIDER_SITE_OTHER): Payer: Medicare Other

## 2015-07-28 ENCOUNTER — Ambulatory Visit (INDEPENDENT_AMBULATORY_CARE_PROVIDER_SITE_OTHER): Payer: Medicare Other | Admitting: Internal Medicine

## 2015-07-28 ENCOUNTER — Encounter: Payer: Self-pay | Admitting: Internal Medicine

## 2015-07-28 ENCOUNTER — Ambulatory Visit (INDEPENDENT_AMBULATORY_CARE_PROVIDER_SITE_OTHER)
Admission: RE | Admit: 2015-07-28 | Discharge: 2015-07-28 | Disposition: A | Discharge status: Home / Self Care | Payer: Medicare Other | Source: Ambulatory Visit | Attending: Internal Medicine | Admitting: Internal Medicine

## 2015-07-28 VITALS — BP 140/88 | HR 50 | Temp 97.8°F | Resp 20 | Ht 68.0 in | Wt 142.0 lb

## 2015-07-28 DIAGNOSIS — R7302 Impaired glucose tolerance (oral): Secondary | ICD-10-CM | POA: Diagnosis not present

## 2015-07-28 DIAGNOSIS — J449 Chronic obstructive pulmonary disease, unspecified: Secondary | ICD-10-CM | POA: Diagnosis not present

## 2015-07-28 DIAGNOSIS — I10 Essential (primary) hypertension: Secondary | ICD-10-CM

## 2015-07-28 DIAGNOSIS — R221 Localized swelling, mass and lump, neck: Secondary | ICD-10-CM

## 2015-07-28 DIAGNOSIS — Z0000 Encounter for general adult medical examination without abnormal findings: Secondary | ICD-10-CM

## 2015-07-28 DIAGNOSIS — E785 Hyperlipidemia, unspecified: Secondary | ICD-10-CM | POA: Diagnosis not present

## 2015-07-28 LAB — URINALYSIS, ROUTINE W REFLEX MICROSCOPIC
Bilirubin Urine: NEGATIVE
HGB URINE DIPSTICK: NEGATIVE
Ketones, ur: NEGATIVE
Leukocytes, UA: NEGATIVE
NITRITE: NEGATIVE
RBC / HPF: NONE SEEN (ref 0–?)
Specific Gravity, Urine: <=1.005 — AB (ref 1.000–1.030)
TOTAL PROTEIN, URINE-UPE24: NEGATIVE
Urine Glucose: NEGATIVE
Urobilinogen, UA: 0.2 (ref 0.0–1.0)
WBC, UA: NONE SEEN (ref 0–?)
pH: 6 (ref 5.0–8.0)

## 2015-07-28 LAB — CBC WITH DIFFERENTIAL/PLATELET
BASOS PCT: 0.4 % (ref 0.0–3.0)
Basophils Absolute: 0 10*3/uL (ref 0.0–0.1)
EOS PCT: 0.5 % (ref 0.0–5.0)
Eosinophils Absolute: 0 10*3/uL (ref 0.0–0.7)
HCT: 45.9 % (ref 39.0–52.0)
Hemoglobin: 15.4 g/dL (ref 13.0–17.0)
LYMPHS ABS: 1.5 10*3/uL (ref 0.7–4.0)
Lymphocytes Relative: 26.1 % (ref 12.0–46.0)
MCHC: 33.6 g/dL (ref 30.0–36.0)
MCV: 90.2 fl (ref 78.0–100.0)
MONO ABS: 0.6 10*3/uL (ref 0.1–1.0)
Monocytes Relative: 10.6 % (ref 3.0–12.0)
NEUTROS PCT: 62.4 % (ref 43.0–77.0)
Neutro Abs: 3.5 10*3/uL (ref 1.4–7.7)
Platelets: 148 10*3/uL — ABNORMAL LOW (ref 150.0–400.0)
RBC: 5.09 Mil/uL (ref 4.22–5.81)
RDW: 13.7 % (ref 11.5–15.5)
WBC: 5.6 10*3/uL (ref 4.0–10.5)

## 2015-07-28 LAB — TSH: TSH: 4.15 u[IU]/mL (ref 0.35–4.50)

## 2015-07-28 LAB — LIPID PANEL
CHOL/HDL RATIO: 2
Cholesterol: 183 mg/dL (ref 0–200)
HDL: 74.2 mg/dL (ref >39.00)
LDL CALC: 98 mg/dL (ref 0–99)
NONHDL: 109.1
Triglycerides: 54 mg/dL (ref 0.0–149.0)
VLDL: 10.8 mg/dL (ref 0.0–40.0)

## 2015-07-28 LAB — BASIC METABOLIC PANEL
BUN: 13 mg/dL (ref 6–23)
CO2: 32 mEq/L (ref 19–32)
Calcium: 9.5 mg/dL (ref 8.4–10.5)
Chloride: 103 mEq/L (ref 96–112)
Creatinine, Ser: 0.91 mg/dL (ref 0.40–1.50)
GFR: 83.71 mL/min (ref >60.00)
Glucose, Bld: 98 mg/dL (ref 70–99)
POTASSIUM: 4.4 meq/L (ref 3.5–5.1)
SODIUM: 140 meq/L (ref 135–145)

## 2015-07-28 LAB — HEPATIC FUNCTION PANEL
ALK PHOS: 75 U/L (ref 39–117)
ALT: 13 U/L (ref 0–53)
AST: 18 U/L (ref 0–37)
Albumin: 4.3 g/dL (ref 3.5–5.2)
BILIRUBIN DIRECT: 0.1 mg/dL (ref 0.0–0.3)
Total Bilirubin: 0.8 mg/dL (ref 0.2–1.2)
Total Protein: 6.9 g/dL (ref 6.0–8.3)

## 2015-07-28 NOTE — Patient Instructions (Addendum)
Your EKG was OK today  Please continue all other medications as before, and refills have been done if requested.  Please have the pharmacy call with any other refills you may need.  Please continue your efforts at being more active, low cholesterol diet, and weight control.  You are otherwise up to date with prevention measures today.  Please keep your appointments with your specialists as you may have planned  You will be contacted regarding the referral for: CT scan of neck - to see [facility] now; we may need to consider referral to ENT if abnormal  Please go to the XRAY Department in the Basement (go straight as you get off the elevator) for the x-ray testing  Please go to the LAB in the Basement (turn left off the elevator) for the tests to be done today  You will be contacted by phone if any changes need to be made immediately.  Otherwise, you will receive a letter about your results with an explanation, but please check with MyChart first.  Please remember to sign up for MyChart if you have not done so, as this will be important to you in the future with finding out test results, communicating by private email, and scheduling acute appointments online when needed.  Please return in 6 months, or sooner if needed

## 2015-07-28 NOTE — Assessment & Plan Note (Signed)
stable overall by history and exam, recent data reviewed with pt, and pt to continue medical treatment as before,  to f/u any worsening symptoms or concerns Lab Results  Component Value Date   HGBA1C 5.6 07/23/2014

## 2015-07-28 NOTE — Progress Notes (Signed)
Pre visit review using our clinic review tool, if applicable. No additional management support is needed unless otherwise documented below in the visit note. 

## 2015-07-28 NOTE — Assessment & Plan Note (Signed)
stable overall by history and exam, recent data reviewed with pt, and pt to continue medical treatment as before,  to f/u any worsening symptoms or concerns SpO2 Readings from Last 3 Encounters:  07/28/15 97%  11/21/14 99%  07/23/14 96%

## 2015-07-28 NOTE — Assessment & Plan Note (Signed)
?   Calcified vs malignant type mass- for bun/cr today, CT neck with CM, consider ENT if abnormal, also for cxr

## 2015-07-28 NOTE — Assessment & Plan Note (Signed)

## 2015-07-28 NOTE — Progress Notes (Signed)
Subjective:    Patient ID: Jason Barton, male    DOB: [birth year], 82 y.o.   MRN: [id number]  HPI  Here for wellness and f/u;  Overall doing ok;  Pt denies Chest pain, worsening SOB, DOE, wheezing, orthopnea, PND, worsening LE edema, palpitations, dizziness or syncope.  Pt denies neurological change such as new headache, facial or extremity weakness.  Pt denies polydipsia, polyuria, or low sugar symptoms. Pt states overall good compliance with treatment and medications, good tolerability, and has been trying to follow appropriate diet.  Pt denies worsening depressive symptoms, suicidal ideation or panic. No fever, night sweats, wt loss, loss of appetite, or other constitutional symptoms.  Pt states good ability with ADL's, has low fall risk, home safety reviewed and adequate, no other significant changes in hearing or vision, and only occasionally active with exercise.  Decliens flu shot and tetanus.   Pt denies fever, wt loss, night sweats, loss of appetite, or other constitutional symptoms  No cough, ST, recent wt loss  Unaware of any lumps to neck Past Medical History  Diagnosis Date  . Nonmelanoma skin cancer 05/21/2011  . S/P inguinal hernia repair 05/21/2011  . S/P appendectomy 05/21/2011  . S/p small bowel obstruction 05/21/2011  . Diverticulosis of colon 05/21/2011  . COPD (chronic obstructive pulmonary disease) ([place]) 05/21/2011  . Thoracic scoliosis 05/21/2011  . Increased prostate specific antigen (PSA) velocity 05/24/2011  . Impaired glucose tolerance 06/08/2012  . Hypothyroidism    Past Surgical History  Procedure Laterality Date  . Appendectomy  1939  . Tonsillectomy  1942  . Shoulder surgery      dr sypher; rotater cuff  . Colon resection      2009 for a "kink in colon" adhesions after appendectomy  . Strabismus surgery Right 11/21/2014    Procedure: REPAIR STRABISMUS RIGHT EYE ;  Surgeon: Everitt Amber, MD;  Location: [facility];  Service: Ophthalmology;   Laterality: Right;    reports that he has never smoked. He does not have any smokeless tobacco history on file. He reports that he drinks alcohol. He reports that he does not use illicit drugs. family history includes Arthritis in his other; Sudden death in his other. No Known Allergies Current Outpatient Prescriptions on File Prior to Visit  Medication Sig Dispense Refill  . Cholecalciferol (VITAMIN D PO) Take 5,000 Units by mouth every morning.    Marland Kitchen levothyroxine (SYNTHROID, LEVOTHROID) 25 MCG tablet Take 1 tablet (25 mcg total) by mouth daily before breakfast. 90 tablet 3  . Specialty Vitamins Products (PROSTATE PO) Take by mouth daily.       No current facility-administered medications on file prior to visit.    Review of Systems Constitutional: Negative for increased diaphoresis, other activity, appetite or siginficant weight change other than noted HENT: Negative for worsening hearing loss, ear pain, facial swelling, mouth sores and neck stiffness.   Eyes: Negative for other worsening pain, redness or visual disturbance.  Respiratory: Negative for shortness of breath and wheezing  Cardiovascular: Negative for chest pain and palpitations.  Gastrointestinal: Negative for diarrhea, blood in stool, abdominal distention or other pain Genitourinary: Negative for hematuria, flank pain or change in urine volume.  Musculoskeletal: Negative for myalgias or other joint complaints.  Skin: Negative for color change and wound or drainage.  Neurological: Negative for syncope and numbness. other than noted Hematological: Negative for adenopathy. or other swelling Psychiatric/Behavioral: Negative for hallucinations, SI, self-injury, decreased concentration or other worsening agitation.  Objective:   Physical Exam BP 140/88 mmHg  Pulse 50  Temp(Src) 97.8 F (36.6 C) (Oral)  Resp 20  Ht 5\' 8"  (1.727 m)  Wt 142 lb (64.411 kg)  BMI 21.60 kg/m2  SpO2 97% VS noted,  Constitutional: Pt is  oriented to person, place, and time. Appears well-developed and well-nourished, in no significant distress Head: Normocephalic and atraumatic.  Right Ear: External ear normal.  Left Ear: External ear normal.  Nose: Nose normal.  Mouth/Throat: Oropharynx is clear and moist.  Eyes: Conjunctivae and EOM are normal. Pupils are equal, round, and reactive to light.  Neck: Normal range of motion. Neck supple. No JVD present. No tracheal deviation present or significant neck LA or mass except for subq hard mass oval shaped, likely just over 1 cm? Immobile located between left mid SCM and trapezoid areas, no other LA or masses noted Cardiovascular: Normal rate, regular rhythm, normal heart sounds and intact distal pulses.   Pulmonary/Chest: Effort normal and breath sounds without rales or wheezing  Abdominal: Soft. Bowel sounds are normal. NT. No HSM  Musculoskeletal: Normal range of motion. Exhibits no edema.  Lymphadenopathy:  Has no cervical adenopathy.  Neurological: Pt is alert and oriented to person, place, and time. Pt has normal reflexes. No cranial nerve deficit. Motor grossly intact Skin: Skin is warm and dry. No rash noted.  Psychiatric:  Has normal mood and affect. Behavior is normal.   ECG reviewed as per emr    Assessment & Plan:

## 2015-07-29 ENCOUNTER — Encounter: Payer: Self-pay | Admitting: Internal Medicine

## 2015-07-29 ENCOUNTER — Ambulatory Visit (INDEPENDENT_AMBULATORY_CARE_PROVIDER_SITE_OTHER)
Admission: RE | Admit: 2015-07-29 | Discharge: 2015-07-29 | Disposition: A | Discharge status: Home / Self Care | Payer: Medicare Other | Source: Ambulatory Visit | Attending: Internal Medicine | Admitting: Internal Medicine

## 2015-07-29 DIAGNOSIS — R221 Localized swelling, mass and lump, neck: Secondary | ICD-10-CM | POA: Diagnosis not present

## 2015-07-29 MED ORDER — IOHEXOL 300 MG/ML  SOLN
75.0000 mL | Freq: Once | INTRAMUSCULAR | Status: AC | PRN
  Administered 2015-07-29: 75 mL via INTRAVENOUS

## 2015-07-31 ENCOUNTER — Telehealth: Payer: Self-pay | Admitting: Internal Medicine

## 2015-07-31 NOTE — Telephone Encounter (Signed)
Pt called need to the assistant concern about the letter he got from Dr. Jenny Reichmann, pt stated there might be something wrong because he is not aware that he has a history of COPD, left mass and Emphysema. Please give him a call back [phone]

## 2015-07-31 NOTE — Telephone Encounter (Signed)
Pt wanted PCP to have this information. Pt states he has never had a left neck mask.  Please advise.

## 2015-09-02 ENCOUNTER — Other Ambulatory Visit: Payer: Self-pay | Admitting: Internal Medicine

## 2016-07-28 ENCOUNTER — Encounter: Payer: Medicare Other | Admitting: Internal Medicine

## 2016-08-16 ENCOUNTER — Ambulatory Visit (INDEPENDENT_AMBULATORY_CARE_PROVIDER_SITE_OTHER): Payer: Medicare Other | Admitting: Internal Medicine

## 2016-08-16 ENCOUNTER — Other Ambulatory Visit (INDEPENDENT_AMBULATORY_CARE_PROVIDER_SITE_OTHER): Payer: Medicare Other

## 2016-08-16 ENCOUNTER — Encounter: Payer: Self-pay | Admitting: Internal Medicine

## 2016-08-16 VITALS — BP 132/88 | HR 58 | Temp 97.6°F | Ht 68.5 in | Wt 139.0 lb

## 2016-08-16 DIAGNOSIS — R7302 Impaired glucose tolerance (oral): Secondary | ICD-10-CM

## 2016-08-16 DIAGNOSIS — J449 Chronic obstructive pulmonary disease, unspecified: Secondary | ICD-10-CM | POA: Diagnosis not present

## 2016-08-16 DIAGNOSIS — Z0000 Encounter for general adult medical examination without abnormal findings: Secondary | ICD-10-CM

## 2016-08-16 DIAGNOSIS — E039 Hypothyroidism, unspecified: Secondary | ICD-10-CM | POA: Diagnosis not present

## 2016-08-16 DIAGNOSIS — E785 Hyperlipidemia, unspecified: Secondary | ICD-10-CM | POA: Diagnosis not present

## 2016-08-16 LAB — LIPID PANEL
Cholesterol: 191 mg/dL (ref 0–200)
HDL: 67.6 mg/dL (ref >39.00)
LDL Cholesterol: 95 mg/dL (ref 0–99)
NonHDL: 123.19
Total CHOL/HDL Ratio: 3
Triglycerides: 140 mg/dL (ref 0.0–149.0)
VLDL: 28 mg/dL (ref 0.0–40.0)

## 2016-08-16 LAB — CBC WITH DIFFERENTIAL/PLATELET
BASOS ABS: 0 10*3/uL (ref 0.0–0.1)
Basophils Relative: 0.5 % (ref 0.0–3.0)
EOS ABS: 0.1 10*3/uL (ref 0.0–0.7)
Eosinophils Relative: 0.9 % (ref 0.0–5.0)
HCT: 44.4 % (ref 39.0–52.0)
HEMOGLOBIN: 14.8 g/dL (ref 13.0–17.0)
LYMPHS PCT: 23 % (ref 12.0–46.0)
Lymphs Abs: 1.3 10*3/uL (ref 0.7–4.0)
MCHC: 33.2 g/dL (ref 30.0–36.0)
MCV: 90.4 fl (ref 78.0–100.0)
Monocytes Absolute: 0.6 10*3/uL (ref 0.1–1.0)
Monocytes Relative: 10.4 % (ref 3.0–12.0)
Neutro Abs: 3.8 10*3/uL (ref 1.4–7.7)
Neutrophils Relative %: 65.2 % (ref 43.0–77.0)
Platelets: 161 10*3/uL (ref 150.0–400.0)
RBC: 4.91 Mil/uL (ref 4.22–5.81)
RDW: 14.2 % (ref 11.5–15.5)
WBC: 5.8 10*3/uL (ref 4.0–10.5)

## 2016-08-16 LAB — BASIC METABOLIC PANEL
BUN: 16 mg/dL (ref 6–23)
CO2: 28 mEq/L (ref 19–32)
CREATININE: 0.93 mg/dL (ref 0.40–1.50)
Calcium: 9.2 mg/dL (ref 8.4–10.5)
Chloride: 103 mEq/L (ref 96–112)
GFR: 81.44 mL/min (ref >60.00)
Glucose, Bld: 94 mg/dL (ref 70–99)
Potassium: 4.1 mEq/L (ref 3.5–5.1)
Sodium: 140 mEq/L (ref 135–145)

## 2016-08-16 LAB — TSH: TSH: 4.61 u[IU]/mL — AB (ref 0.35–4.50)

## 2016-08-16 LAB — URINALYSIS, ROUTINE W REFLEX MICROSCOPIC
BILIRUBIN URINE: NEGATIVE
Hgb urine dipstick: NEGATIVE
KETONES UR: NEGATIVE
Leukocytes, UA: NEGATIVE
NITRITE: NEGATIVE
RBC / HPF: NONE SEEN (ref 0–?)
SPECIFIC GRAVITY, URINE: 1.015 (ref 1.000–1.030)
Total Protein, Urine: NEGATIVE
URINE GLUCOSE: NEGATIVE
UROBILINOGEN UA: 0.2 (ref 0.0–1.0)
pH: 6 (ref 5.0–8.0)

## 2016-08-16 LAB — HEMOGLOBIN A1C: HEMOGLOBIN A1C: 5.5 % (ref 4.6–6.5)

## 2016-08-16 LAB — HEPATIC FUNCTION PANEL
ALT: 14 U/L (ref 0–53)
AST: 16 U/L (ref 0–37)
Albumin: 4.2 g/dL (ref 3.5–5.2)
Alkaline Phosphatase: 66 U/L (ref 39–117)
Bilirubin, Direct: 0.1 mg/dL (ref 0.0–0.3)
Total Bilirubin: 0.5 mg/dL (ref 0.2–1.2)
Total Protein: 6.6 g/dL (ref 6.0–8.3)

## 2016-08-16 NOTE — Progress Notes (Signed)
Subjective:    Patient ID: Jason Barton, male    DOB: 09/27/1925, 83 y.o.   MRN: [id number]  HPI  Here for wellness and f/u;  Overall doing ok;  Pt denies Chest pain, worsening SOB, DOE, wheezing, orthopnea, PND, worsening LE edema, palpitations, dizziness or syncope.  Pt denies neurological change such as new headache, facial or extremity weakness.  Pt denies polydipsia, polyuria, or low sugar symptoms. Pt states overall good compliance with treatment and medications, good tolerability, and has been trying to follow appropriate diet.  Pt denies worsening depressive symptoms, suicidal ideation or panic. No fever, night sweats, wt loss, loss of appetite, or other constitutional symptoms.  Pt states good ability with ADL's, has low fall risk, home safety reviewed and adequate, no other significant changes in hearing or vision, and only occasionally active with exercise, but remains active and able for age, does not have worsening balance, falls, and does not need cane or other assistive device. Wt Readings from Last 3 Encounters:  08/16/16 139 lb (63 kg)  07/28/15 142 lb (64.4 kg)  11/21/14 140 lb (63.5 kg)  Walks 2 miles about 3-4 times per wk.  Does have painful corn or wart to right lateral foot, will self-refer to podiatry for now.  Has had some worsening  ED symptoms recently, but not interested in Viagra for now due to fear of side effects.   Past Medical History:  Diagnosis Date  . COPD (chronic obstructive pulmonary disease) ([place]) 05/21/2011  . Diverticulosis of colon 05/21/2011  . Hypothyroidism   . Impaired glucose tolerance 06/08/2012  . Increased prostate specific antigen (PSA) velocity 05/24/2011  . Nonmelanoma skin cancer 05/21/2011  . S/P appendectomy 05/21/2011  . S/P inguinal hernia repair 05/21/2011  . S/p small bowel obstruction 05/21/2011  . Thoracic scoliosis 05/21/2011   Past Surgical History:  Procedure Laterality Date  . APPENDECTOMY  1939  . COLON RESECTION     2009  for a "kink in colon" adhesions after appendectomy  . SHOULDER SURGERY     dr sypher; rotater cuff  . STRABISMUS SURGERY Right 11/21/2014   Procedure: REPAIR STRABISMUS RIGHT EYE ;  Surgeon: Everitt Amber, MD;  Location: [facility];  Service: Ophthalmology;  Laterality: Right;  . TONSILLECTOMY  1942    reports that he has never smoked. He has never used smokeless tobacco. He reports that he drinks alcohol. He reports that he does not use drugs. family history includes Arthritis in his other; Sudden death in his other. No Known Allergies Current Outpatient Prescriptions on File Prior to Visit  Medication Sig Dispense Refill  . Cholecalciferol (VITAMIN D PO) Take 5,000 Units by mouth every morning.    Marland Kitchen levothyroxine (SYNTHROID, LEVOTHROID) 25 MCG tablet TAKE 1 TABLET BY MOUTH EVERY DAY IN THE MORNING BEFORE BREAKFAST 90 tablet 2  . Specialty Vitamins Products (PROSTATE PO) Take by mouth daily.       No current facility-administered medications on file prior to visit.     Review of Systems Constitutional: Negative for increased diaphoresis, or other activity, appetite or siginficant weight change other than noted HENT: Negative for worsening hearing loss, ear pain, facial swelling, mouth sores and neck stiffness.   Eyes: Negative for other worsening pain, redness or visual disturbance.  Respiratory: Negative for choking or stridor Cardiovascular: Negative for other chest pain and palpitations.  Gastrointestinal: Negative for worsening diarrhea, blood in stool, or abdominal distention Genitourinary: Negative for hematuria, flank pain or change in urine  volume.  Musculoskeletal: Negative for myalgias or other joint complaints.  Skin: Negative for other color change and wound or drainage.  Neurological: Negative for syncope and numbness. other than noted Hematological: Negative for adenopathy. or other swelling Psychiatric/Behavioral: Negative for hallucinations, SI,  self-injury, decreased concentration or other worsening agitation.  All other system neg per pt    Objective:   Physical Exam BP 132/88   Pulse (!) 58   Temp 97.6 F (36.4 C)   Ht 5' 8.5" (1.74 m)   Wt 139 lb (63 kg)   SpO2 98%   BMI 20.83 kg/m  VS noted,  Constitutional: Pt is oriented to person, place, and time. Appears well-developed and well-nourished, in no significant distress Head: Normocephalic and atraumatic  Eyes: Conjunctivae and EOM are normal. Pupils are equal, round, and reactive to light Right Ear: External ear normal.  Left Ear: External ear normal Nose: Nose normal.  Mouth/Throat: Oropharynx is clear and moist  Neck: Normal range of motion. Neck supple. No JVD present. No tracheal deviation present or significant neck LA or mass Cardiovascular: Normal rate, regular rhythm, normal heart sounds and intact distal pulses.   Pulmonary/Chest: Effort normal and breath sounds without rales or wheezing  Abdominal: Soft. Bowel sounds are normal. NT. No HSM  Musculoskeletal: Normal range of motion. Exhibits no edema Lymphadenopathy: Has no cervical adenopathy.  Neurological: Pt is alert and oriented to person, place, and time. Pt has normal reflexes. No cranial nerve deficit. Motor grossly intact Skin: Skin is warm and dry. No rash noted or new ulcers Psychiatric:  Has normal mood and affect. Behavior is normal. \  ECG today I have personally interpreted Sinus brady - 52    Assessment & Plan:

## 2016-08-16 NOTE — Patient Instructions (Addendum)
You had the Tdap (tetanus) shot today  Please refer yourself to podiatry for the right foot corn  Please continue all other medications as before, and refills have been done if requested.  Please have the pharmacy call with any other refills you may need.  Please continue your efforts at being more active, low cholesterol diet, and weight control.  You are otherwise up to date with prevention measures today.  Please keep your appointments with your specialists as you may have planned  Please go to the LAB in the Basement (turn left off the elevator) for the tests to be done today  You will be contacted by phone if any changes need to be made immediately.  Otherwise, you will receive a letter about your results with an explanation, but please check with MyChart first.  Please remember to sign up for MyChart if you have not done so, as this will be important to you in the future with finding out test results, communicating by private email, and scheduling acute appointments online when needed.  Please return in 1 year for your yearly visit, or sooner if needed, with Lab testing done 3-5 days before

## 2016-08-17 NOTE — Assessment & Plan Note (Signed)
stable overall by history and exam, recent data reviewed with pt, and pt to continue medical treatment as before,  to f/u any worsening symptoms or concerns Lab Results  Component Value Date   TSH 4.61 (H) 08/16/2016

## 2016-08-17 NOTE — Assessment & Plan Note (Signed)
stable overall by history and exam, recent data reviewed with pt, and pt to continue medical treatment as before,  to f/u any worsening symptoms or concerns Lab Results  Component Value Date   HGBA1C 5.5 08/16/2016

## 2016-08-17 NOTE — Assessment & Plan Note (Signed)

## 2016-08-17 NOTE — Assessment & Plan Note (Signed)
No worsening symptoms, stable overall by history and exam, and pt to continue medical treatment as before,  to f/u any worsening symptoms or concerns

## 2016-08-17 NOTE — Assessment & Plan Note (Signed)
stable overall by history and exam, recent data reviewed with pt, and pt to continue medical treatment as before,  to f/u any worsening symptoms or concerns Lab Results  Component Value Date   [place] 95 08/16/2016

## 2016-10-27 ENCOUNTER — Other Ambulatory Visit: Payer: Self-pay | Admitting: Internal Medicine

## 2017-08-17 ENCOUNTER — Telehealth: Payer: Self-pay

## 2017-08-17 ENCOUNTER — Encounter: Payer: Medicare Other | Admitting: Internal Medicine

## 2017-08-17 ENCOUNTER — Other Ambulatory Visit: Payer: Self-pay | Admitting: Internal Medicine

## 2017-08-17 ENCOUNTER — Encounter: Payer: Self-pay | Admitting: Internal Medicine

## 2017-08-17 ENCOUNTER — Ambulatory Visit (INDEPENDENT_AMBULATORY_CARE_PROVIDER_SITE_OTHER): Payer: Medicare Other | Admitting: Internal Medicine

## 2017-08-17 ENCOUNTER — Other Ambulatory Visit (INDEPENDENT_AMBULATORY_CARE_PROVIDER_SITE_OTHER): Payer: Medicare Other

## 2017-08-17 VITALS — BP 108/68 | HR 56 | Temp 97.6°F | Ht 68.5 in | Wt 139.0 lb

## 2017-08-17 DIAGNOSIS — R7302 Impaired glucose tolerance (oral): Secondary | ICD-10-CM | POA: Diagnosis not present

## 2017-08-17 DIAGNOSIS — Z23 Encounter for immunization: Secondary | ICD-10-CM | POA: Diagnosis not present

## 2017-08-17 DIAGNOSIS — E785 Hyperlipidemia, unspecified: Secondary | ICD-10-CM

## 2017-08-17 DIAGNOSIS — E039 Hypothyroidism, unspecified: Secondary | ICD-10-CM | POA: Diagnosis not present

## 2017-08-17 DIAGNOSIS — Z0000 Encounter for general adult medical examination without abnormal findings: Secondary | ICD-10-CM

## 2017-08-17 DIAGNOSIS — N529 Male erectile dysfunction, unspecified: Secondary | ICD-10-CM | POA: Diagnosis not present

## 2017-08-17 DIAGNOSIS — C439 Malignant melanoma of skin, unspecified: Secondary | ICD-10-CM | POA: Insufficient documentation

## 2017-08-17 LAB — URINALYSIS, ROUTINE W REFLEX MICROSCOPIC
Bilirubin Urine: NEGATIVE
Hgb urine dipstick: NEGATIVE
Ketones, ur: NEGATIVE
Leukocytes, UA: NEGATIVE
NITRITE: NEGATIVE
PH: 6 (ref 5.0–8.0)
RBC / HPF: NONE SEEN (ref 0–?)
Specific Gravity, Urine: 1.015 (ref 1.000–1.030)
Total Protein, Urine: NEGATIVE
Urine Glucose: NEGATIVE
Urobilinogen, UA: 0.2 (ref 0.0–1.0)

## 2017-08-17 LAB — CBC WITH DIFFERENTIAL/PLATELET
BASOS PCT: 0.7 % (ref 0.0–3.0)
Basophils Absolute: 0 10*3/uL (ref 0.0–0.1)
Eosinophils Absolute: 0.1 10*3/uL (ref 0.0–0.7)
Eosinophils Relative: 1.4 % (ref 0.0–5.0)
HEMATOCRIT: 43.5 % (ref 39.0–52.0)
HEMOGLOBIN: 14.6 g/dL (ref 13.0–17.0)
LYMPHS PCT: 25.6 % (ref 12.0–46.0)
Lymphs Abs: 1.3 10*3/uL (ref 0.7–4.0)
MCHC: 33.5 g/dL (ref 30.0–36.0)
MCV: 90.3 fl (ref 78.0–100.0)
MONOS PCT: 12.6 % — AB (ref 3.0–12.0)
Monocytes Absolute: 0.7 10*3/uL (ref 0.1–1.0)
Neutro Abs: 3.1 10*3/uL (ref 1.4–7.7)
Neutrophils Relative %: 59.7 % (ref 43.0–77.0)
Platelets: 143 10*3/uL — ABNORMAL LOW (ref 150.0–400.0)
RBC: 4.82 Mil/uL (ref 4.22–5.81)
RDW: 13.8 % (ref 11.5–15.5)
WBC: 5.2 10*3/uL (ref 4.0–10.5)

## 2017-08-17 LAB — HEPATIC FUNCTION PANEL
ALBUMIN: 4 g/dL (ref 3.5–5.2)
ALT: 9 U/L (ref 0–53)
AST: 13 U/L (ref 0–37)
Alkaline Phosphatase: 68 U/L (ref 39–117)
BILIRUBIN TOTAL: 0.9 mg/dL (ref 0.2–1.2)
Bilirubin, Direct: 0.2 mg/dL (ref 0.0–0.3)
TOTAL PROTEIN: 6.9 g/dL (ref 6.0–8.3)

## 2017-08-17 LAB — BASIC METABOLIC PANEL
BUN: 11 mg/dL (ref 6–23)
CHLORIDE: 104 meq/L (ref 96–112)
CO2: 31 mEq/L (ref 19–32)
CREATININE: 0.94 mg/dL (ref 0.40–1.50)
Calcium: 9.3 mg/dL (ref 8.4–10.5)
GFR: 80.25 mL/min (ref >60.00)
Glucose, Bld: 91 mg/dL (ref 70–99)
Potassium: 3.6 mEq/L (ref 3.5–5.1)
Sodium: 142 mEq/L (ref 135–145)

## 2017-08-17 LAB — LIPID PANEL
CHOL/HDL RATIO: 3
Cholesterol: 182 mg/dL (ref 0–200)
HDL: 69 mg/dL (ref >39.00)
LDL CALC: 99 mg/dL (ref 0–99)
NonHDL: 112.65
Triglycerides: 69 mg/dL (ref 0.0–149.0)
VLDL: 13.8 mg/dL (ref 0.0–40.0)

## 2017-08-17 LAB — TSH: TSH: 6.14 u[IU]/mL — AB (ref 0.35–4.50)

## 2017-08-17 LAB — HEMOGLOBIN A1C: Hgb A1c MFr Bld: 5.3 % (ref 4.6–6.5)

## 2017-08-17 LAB — TESTOSTERONE: Testosterone: 292.85 ng/dL — ABNORMAL LOW (ref 300.00–890.00)

## 2017-08-17 MED ORDER — ZOSTER VAC RECOMB ADJUVANTED 50 MCG/0.5ML IM SUSR: 0.5000 mL | Freq: Once | INTRAMUSCULAR | 1 refills | Status: AC

## 2017-08-17 MED ORDER — LEVOTHYROXINE SODIUM 50 MCG PO TABS: 50.0000 ug | ORAL_TABLET | Freq: Every day | ORAL | 3 refills | Status: DC

## 2017-08-17 NOTE — Progress Notes (Signed)
Subjective:    Patient ID: Jason Barton, male    DOB: 11/21/1925, 84 y.o.   MRN: [id number]  HPI  Here for wellness and f/u;  Overall doing ok;  Pt denies Chest pain, worsening SOB, DOE, wheezing, orthopnea, PND, worsening LE edema, palpitations, dizziness or syncope.  Pt denies neurological change such as new headache, facial or extremity weakness.  Pt denies polydipsia, polyuria, or low sugar symptoms. Pt states overall good compliance with treatment and medications, good tolerability, and has been trying to follow appropriate diet.  Pt denies worsening depressive symptoms, suicidal ideation or panic. No fever, night sweats, wt loss, loss of appetite, or other constitutional symptoms.  Pt states good ability with ADL's, has low fall risk, home safety reviewed and adequate, no other significant changes in hearing or vision, and not active with exercise.  Does c/o ongoing fatigue, but denies signficant daytime hypersomnolence. Denies hyper or hypo thyroid symptoms such as voice, skin or hair change.  No other interval change or new complaints Past Medical History:  Diagnosis Date  . COPD (chronic obstructive pulmonary disease) ([place]) 05/21/2011  . Diverticulosis of colon 05/21/2011  . Hypothyroidism   . Impaired glucose tolerance 06/08/2012  . Increased prostate specific antigen (PSA) velocity 05/24/2011  . Nonmelanoma skin cancer 05/21/2011  . S/P appendectomy 05/21/2011  . S/P inguinal hernia repair 05/21/2011  . S/p small bowel obstruction 05/21/2011  . Thoracic scoliosis 05/21/2011   Past Surgical History:  Procedure Laterality Date  . APPENDECTOMY  1939  . COLON RESECTION     2009 for a "kink in colon" adhesions after appendectomy  . SHOULDER SURGERY     dr sypher; rotater cuff  . STRABISMUS SURGERY Right 11/21/2014   Procedure: REPAIR STRABISMUS RIGHT EYE ;  Surgeon: Everitt Amber, MD;  Location: [facility];  Service: Ophthalmology;  Laterality: Right;  . TONSILLECTOMY   1942    reports that  has never smoked. he has never used smokeless tobacco. He reports that he drinks alcohol. He reports that he does not use drugs. family history includes Arthritis in his other; Sudden death in his other. No Known Allergies Current Outpatient Medications on File Prior to Visit  Medication Sig Dispense Refill  . Cholecalciferol (VITAMIN D PO) Take 5,000 Units by mouth every morning.    Marland Kitchen Specialty Vitamins Products (PROSTATE PO) Take by mouth daily.       No current facility-administered medications on file prior to visit.    Review of Systems Constitutional: Negative for other unusual diaphoresis, sweats, appetite or weight changes HENT: Negative for other worsening hearing loss, ear pain, facial swelling, mouth sores or neck stiffness.   Eyes: Negative for other worsening pain, redness or other visual disturbance.  Respiratory: Negative for other stridor or swelling Cardiovascular: Negative for other palpitations or other chest pain  Gastrointestinal: Negative for worsening diarrhea or loose stools, blood in stool, distention or other pain Genitourinary: Negative for hematuria, flank pain or other change in urine volume.  Musculoskeletal: Negative for myalgias or other joint swelling.  Skin: Negative for other color change, or other wound or worsening drainage.  Neurological: Negative for other syncope or numbness. Hematological: Negative for other adenopathy or swelling Psychiatric/Behavioral: Negative for hallucinations, other worsening agitation, SI, self-injury, or new decreased concentration All other system neg per pt    Objective:   Physical Exam BP 108/68   Pulse (!) 56   Temp 97.6 F (36.4 C) (Oral)   Ht 5' 8.5" (1.74  m)   Wt 139 lb (63 kg)   SpO2 97%   BMI 20.83 kg/m  VS noted,  Constitutional: Pt is oriented to person, place, and time. Appears well-developed and well-nourished, in no significant distress and comfortable Head: Normocephalic and  atraumatic  Eyes: Conjunctivae and EOM are normal. Pupils are equal, round, and reactive to light Right Ear: External ear normal without discharge Left Ear: External ear normal without discharge Nose: Nose without discharge or deformity Mouth/Throat: Oropharynx is without other ulcerations and moist  Neck: Normal range of motion. Neck supple. No JVD present. No tracheal deviation present or significant neck LA or mass Cardiovascular: Normal rate, regular rhythm, normal heart sounds and intact distal pulses.   Pulmonary/Chest: WOB normal and breath sounds without rales or wheezing  Abdominal: Soft. Bowel sounds are normal. NT. No HSM  Musculoskeletal: Normal range of motion. Exhibits no edema Lymphadenopathy: Has no other cervical adenopathy.  Neurological: Pt is alert and oriented to person, place, and time. Pt has normal reflexes. No cranial nerve deficit. Motor grossly intact, Gait intact Skin: Skin is warm and dry. No rash noted or new ulcerations Psychiatric:  Has normal mood and affect. Behavior is normal without agitation No other exam findings    Assessment & Plan:

## 2017-08-17 NOTE — Telephone Encounter (Signed)
-----   Message from Biagio Borg, MD sent at 08/17/2017 12:50 PM EST ----- Left message on MyChart, pt to cont same tx except  The test results show that your current treatment is OK, except the testosterone is very mildly low, and the thyroid as well.  I would hesitate to try testosterone treatment at your age due to risk of side effects, but we should increase the thyroid medication from 25 to 50 mcg per day.    Joshual Terrio to please inform pt, I will do rx for thyroid

## 2017-08-17 NOTE — Telephone Encounter (Signed)
Pt has been informed and expressed understanding.  

## 2017-08-17 NOTE — Patient Instructions (Addendum)
You had the Pneumovax pneumonia shot today  Your shingles shot prescriptoin was sent to [facility]  Please continue all other medications as before, and refills have been done if requested.  Please have the pharmacy call with any other refills you may need.  Please continue your efforts at being more active, low cholesterol diet, and weight control.  You are otherwise up to date with prevention measures today.  Please keep your appointments with your specialists as you may have planned  Please go to the LAB in the Basement (turn left off the elevator) for the tests to be done today  You will be contacted by phone if any changes need to be made immediately.  Otherwise, you will receive a letter about your results with an explanation, but please check with MyChart first.  Please remember to sign up for MyChart if you have not done so, as this will be important to you in the future with finding out test results, communicating by private email, and scheduling acute appointments online when needed.  Please return in 1 year for your yearly visit, or sooner if needed, with Lab testing done 3-5 days before

## 2017-08-19 NOTE — Assessment & Plan Note (Signed)
stable overall by history and exam, recent data reviewed with pt, and pt to continue medical treatment as before,  to f/u any worsening symptoms or concerns Lab Results  Component Value Date   LDLCALC 99 08/17/2017

## 2017-08-19 NOTE — Assessment & Plan Note (Signed)

## 2017-08-19 NOTE — Assessment & Plan Note (Signed)
Lab Results  Component Value Date   HGBA1C 5.3 08/17/2017  stable overall by history and exam, recent data reviewed with pt, and pt to continue medical treatment as before,  to f/u any worsening symptoms or concerns

## 2017-08-19 NOTE — Assessment & Plan Note (Signed)
Lab Results  Component Value Date   TSH 6.14 (H) 08/17/2017  stable overall by history and exam, recent data reviewed with pt, and pt to continue medical treatment as before,  to f/u any worsening symptoms or concerns

## 2017-08-19 NOTE — Assessment & Plan Note (Signed)
For testosterone check,  to f/u any worsening symptoms or concerns  

## 2017-12-05 ENCOUNTER — Telehealth: Payer: Self-pay | Admitting: Emergency Medicine

## 2017-12-05 NOTE — Telephone Encounter (Signed)
Called patient to schedule AWV. Pt declined at this time. 

## 2017-12-18 ENCOUNTER — Other Ambulatory Visit (INDEPENDENT_AMBULATORY_CARE_PROVIDER_SITE_OTHER): Payer: Medicare Other

## 2017-12-18 ENCOUNTER — Ambulatory Visit: Payer: Medicare Other | Admitting: Family

## 2017-12-18 ENCOUNTER — Ambulatory Visit (INDEPENDENT_AMBULATORY_CARE_PROVIDER_SITE_OTHER)
Admission: RE | Admit: 2017-12-18 | Discharge: 2017-12-18 | Disposition: A | Discharge status: Home / Self Care | Payer: Medicare Other | Source: Ambulatory Visit | Attending: Family | Admitting: Family

## 2017-12-18 ENCOUNTER — Encounter: Payer: Self-pay | Admitting: Family

## 2017-12-18 VITALS — BP 126/78 | HR 69 | Temp 98.3°F | Ht 68.5 in | Wt 131.1 lb

## 2017-12-18 DIAGNOSIS — J209 Acute bronchitis, unspecified: Secondary | ICD-10-CM

## 2017-12-18 DIAGNOSIS — R634 Abnormal weight loss: Secondary | ICD-10-CM

## 2017-12-18 LAB — CBC WITH DIFFERENTIAL/PLATELET
Basophils Absolute: 0 10*3/uL (ref 0.0–0.1)
Basophils Relative: 0.5 % (ref 0.0–3.0)
Eosinophils Absolute: 0 10*3/uL (ref 0.0–0.7)
Eosinophils Relative: 0.1 % (ref 0.0–5.0)
HCT: 42.4 % (ref 39.0–52.0)
Hemoglobin: 14.2 g/dL (ref 13.0–17.0)
LYMPHS ABS: 1.1 10*3/uL (ref 0.7–4.0)
Lymphocytes Relative: 12.1 % (ref 12.0–46.0)
MCHC: 33.6 g/dL (ref 30.0–36.0)
MCV: 90.1 fl (ref 78.0–100.0)
MONOS PCT: 11.6 % (ref 3.0–12.0)
Monocytes Absolute: 1.1 10*3/uL — ABNORMAL HIGH (ref 0.1–1.0)
NEUTROS ABS: 7.1 10*3/uL (ref 1.4–7.7)
NEUTROS PCT: 75.7 % (ref 43.0–77.0)
Platelets: 133 10*3/uL — ABNORMAL LOW (ref 150.0–400.0)
RBC: 4.71 Mil/uL (ref 4.22–5.81)
RDW: 14.4 % (ref 11.5–15.5)
WBC: 9.4 10*3/uL (ref 4.0–10.5)

## 2017-12-18 LAB — COMPREHENSIVE METABOLIC PANEL
ALK PHOS: 68 U/L (ref 39–117)
ALT: 8 U/L (ref 0–53)
AST: 11 U/L (ref 0–37)
Albumin: 4 g/dL (ref 3.5–5.2)
BUN: 16 mg/dL (ref 6–23)
CO2: 31 meq/L (ref 19–32)
Calcium: 9 mg/dL (ref 8.4–10.5)
Chloride: 104 mEq/L (ref 96–112)
Creatinine, Ser: 1.01 mg/dL (ref 0.40–1.50)
GFR: 73.81 mL/min (ref >60.00)
GLUCOSE: 120 mg/dL — AB (ref 70–99)
Potassium: 3.7 mEq/L (ref 3.5–5.1)
Sodium: 141 mEq/L (ref 135–145)
Total Bilirubin: 0.8 mg/dL (ref 0.2–1.2)
Total Protein: 6.4 g/dL (ref 6.0–8.3)

## 2017-12-18 LAB — TSH: TSH: 3.79 u[IU]/mL (ref 0.35–4.50)

## 2017-12-18 MED ORDER — BENZONATATE 100 MG PO CAPS: 100.0000 mg | ORAL_CAPSULE | Freq: Three times a day (TID) | ORAL | 0 refills | Status: DC | PRN

## 2017-12-18 MED ORDER — AMOXICILLIN-POT CLAVULANATE 875-125 MG PO TABS: 1.0000 | ORAL_TABLET | Freq: Two times a day (BID) | ORAL | 0 refills | Status: DC

## 2017-12-18 NOTE — Progress Notes (Signed)
Jason Barton is a 84 y.o. male with the following history as recorded in Epic[employer]:  Patient Active Problem List   Diagnosis Date Noted  . Melanoma ([place]) 08/17/2017  . Hypothyroidism 08/16/2016  . Mass of left side of neck 07/28/2015  . Abnormal TSH 07/23/2014  . Chest pain 10/01/2013  . Impaired glucose tolerance 06/08/2012  . Erectile dysfunction 11/27/2011  . Hyperlipidemia 05/24/2011  . Increased prostate specific antigen (PSA) velocity 05/24/2011  . Weight loss 05/24/2011  . Preventative health care 05/21/2011  . Nonmelanoma skin cancer 05/21/2011  . S/P inguinal hernia repair 05/21/2011  . S/P appendectomy 05/21/2011  . S/p small bowel obstruction 05/21/2011  . Diverticulosis of colon 05/21/2011  . COPD (chronic obstructive pulmonary disease) ([place]) 05/21/2011  . Thoracic scoliosis 05/21/2011    Current Outpatient Medications  Medication Sig Dispense Refill  . Cholecalciferol (VITAMIN D PO) Take 5,000 Units by mouth every morning.    Marland Kitchen levothyroxine (SYNTHROID, LEVOTHROID) 50 MCG tablet Take 1 tablet (50 mcg total) by mouth daily before breakfast. 90 tablet 3  . neomycin-polymyxin b-dexamethasone (MAXITROL) 3.5-10000-0.1 OINT APPLY A SMALL AMOUNT TO LOWER EYELID INTO RIGHT EYE AT BEDTIME FOR 2 WEEKS *START AFTER SURGERY*  0  . Specialty Vitamins Products (PROSTATE PO) Take by mouth daily.      Marland Kitchen amoxicillin-clavulanate (AUGMENTIN) 875-125 MG tablet Take 1 tablet by mouth 2 (two) times daily. 20 tablet 0  . benzonatate (TESSALON) 100 MG capsule Take 1 capsule (100 mg total) by mouth 3 (three) times daily as needed. 20 capsule 0   No current facility-administered medications for this visit.     Allergies: Patient has no known allergies.  Past Medical History:  Diagnosis Date  . COPD (chronic obstructive pulmonary disease) ([place]) 05/21/2011  . Diverticulosis of colon 05/21/2011  . Hypothyroidism   . Impaired glucose tolerance 06/08/2012  . Increased prostate specific  antigen (PSA) velocity 05/24/2011  . Nonmelanoma skin cancer 05/21/2011  . S/P appendectomy 05/21/2011  . S/P inguinal hernia repair 05/21/2011  . S/p small bowel obstruction 05/21/2011  . Thoracic scoliosis 05/21/2011    Past Surgical History:  Procedure Laterality Date  . APPENDECTOMY  1939  . COLON RESECTION     2009 for a "kink in colon" adhesions after appendectomy  . SHOULDER SURGERY     dr sypher; rotater cuff  . STRABISMUS SURGERY Right 11/21/2014   Procedure: REPAIR STRABISMUS RIGHT EYE ;  Surgeon: Everitt Amber, MD;  Location: [facility];  Service: Ophthalmology;  Laterality: Right;  . TONSILLECTOMY  1942    Family History  Problem Relation Age of Onset  . Arthritis Other   . Sudden death Other     Social History   Tobacco Use  . Smoking status: Never Smoker  . Smokeless tobacco: Never Used  Substance Use Topics  . Alcohol use: Yes    Comment:  Drinks 3 drinks a year    Subjective:  Patient presents with cold symptoms x 3 days; is accompanied by his daughter; + thick yellow mucus; denies any chest pain or shortness of breath; + coughing fits; able to sleep at night; not taking any OTC medications; in the past, has been prone to bronchitis; Also, concerned for 8 pound weight loss in the past 6 months; unintended; is primary caregiver for his wife-admits does not eat a lot/ appetite is decreased some; does occasionally drink Boost;   Objective:  Vitals:   12/18/17 1613  BP: 126/78  Pulse: 69  Temp:  98.3 F (36.8 C)  TempSrc: Oral  SpO2: 95%  Weight: 131 lb 1.9 oz (59.5 kg)  Height: 5' 8.5" (1.74 m)    General: Well developed, well nourished, in no acute distress  Skin : Warm and dry.  Head: Normocephalic and atraumatic  Eyes: Sclera and conjunctiva clear; pupils round and reactive to light; extraocular movements intact  Ears: External normal; canals clear; tympanic membranes normal  Oropharynx: Pink, supple. No suspicious lesions  Neck: Supple  without thyromegaly, adenopathy  Lungs: Respirations unlabored; clear to auscultation bilaterally without wheeze, rales, rhonchi  CVS exam: normal rate and regular rhythm.  Neurologic: Alert and oriented; speech intact; face symmetrical; moves all extremities well; CNII-XII intact without focal deficit   Assessment:  1. Acute bronchitis, unspecified organism   2. Weight loss     Plan:  1. Rx for Augmentin 875 mg bid x 10 days, Tessalon perles; increase fluids, rest and follow-up worse, no better. 2. Will update labs and CXR today; encouraged to work on nutrition/ taking in extra calories/ drink Boost daily; follow-up as needed based on labs.   No follow-ups on file.  Orders Placed This Encounter  Procedures  . DG Chest 2 View    Standing Status:   Future    Number of Occurrences:   1    Standing Expiration Date:   02/19/2019    Order Specific Question:   Reason for Exam (SYMPTOM  OR DIAGNOSIS REQUIRED)    Answer:   weight loss    Order Specific Question:   Preferred imaging location?    Answer:   [facility]    Order Specific Question:   Radiology Contrast Protocol - do NOT remove file path    Answer:   [email or web address]  . CBC w/Diff    Standing Status:   Future    Number of Occurrences:   1    Standing Expiration Date:   12/18/2018  . Comp Met (CMET)    Standing Status:   Future    Number of Occurrences:   1    Standing Expiration Date:   12/18/2018  . TSH    Standing Status:   Future    Number of Occurrences:   1    Standing Expiration Date:   12/18/2018    Requested Prescriptions   Signed Prescriptions Disp Refills  . amoxicillin-clavulanate (AUGMENTIN) 875-125 MG tablet 20 tablet 0    Sig: Take 1 tablet by mouth 2 (two) times daily.  . benzonatate (TESSALON) 100 MG capsule 20 capsule 0    Sig: Take 1 capsule (100 mg total) by mouth 3 (three) times daily as needed.

## 2017-12-19 ENCOUNTER — Ambulatory Visit: Payer: Medicare Other | Admitting: Family

## 2017-12-19 NOTE — Progress Notes (Signed)
Patient notified; plan to repeat CXR in about 3 weeks.

## 2017-12-20 ENCOUNTER — Ambulatory Visit: Payer: Medicare Other | Admitting: Internal Medicine

## 2017-12-20 ENCOUNTER — Ambulatory Visit: Payer: Self-pay | Admitting: *Deleted

## 2017-12-20 ENCOUNTER — Encounter: Payer: Self-pay | Admitting: Internal Medicine

## 2017-12-20 VITALS — BP 108/72 | HR 62 | Temp 97.8°F | Ht 68.5 in | Wt 135.0 lb

## 2017-12-20 DIAGNOSIS — J189 Pneumonia, unspecified organism: Secondary | ICD-10-CM | POA: Diagnosis not present

## 2017-12-20 DIAGNOSIS — R21 Rash and other nonspecific skin eruption: Secondary | ICD-10-CM | POA: Insufficient documentation

## 2017-12-20 DIAGNOSIS — J449 Chronic obstructive pulmonary disease, unspecified: Secondary | ICD-10-CM

## 2017-12-20 DIAGNOSIS — R7302 Impaired glucose tolerance (oral): Secondary | ICD-10-CM | POA: Diagnosis not present

## 2017-12-20 MED ORDER — LEVOFLOXACIN 250 MG PO TABS: 250.0000 mg | ORAL_TABLET | Freq: Every day | ORAL | 0 refills | Status: AC

## 2017-12-20 MED ORDER — METHYLPREDNISOLONE ACETATE 80 MG/ML IJ SUSP
80.0000 mg | Freq: Once | INTRAMUSCULAR | Status: AC
  Administered 2017-12-20: 80 mg via INTRAMUSCULAR

## 2017-12-20 MED ORDER — PREDNISONE 10 MG PO TABS: ORAL_TABLET | ORAL | 0 refills | Status: DC

## 2017-12-20 NOTE — Telephone Encounter (Signed)
Pt reports red, non-raised diffuse rash, onset Tuesday AM. Started Augmentin and tessalon perles 12/18/17; acute bronchitis. Rash on back, neck and chest. Reports severe itching. Has taken benadryl and applied hydrocortisone cream, minimally effective. Denies any SOB, dysphagia, no tongue, throat swelling, afebrile. Same day appt made with Dr. Jenny Reichmann. Care advise given per protocol. Instructed to go to ED for any oral swelling, SOB. Pt verbalizes understanding. Reason for Disposition . Taking new prescription antibiotic (Exception: finished taking new prescription antibiotic)  Answer Assessment - Initial Assessment Questions 1. APPEARANCE of RASH: "Describe the rash." (e.g., spots, blisters, raised areas, skin peeling, scaly)     Red, diffuse 2. SIZE: "How big are the spots?" (e.g., tip of pen, eraser, coin; inches, centimeters)     Not raised 3. LOCATION: "Where is the rash located?"     Back, neck and chest 4. COLOR: "What color is the rash?" (Note: It is difficult to assess rash color in people with darker-colored skin. When this situation occurs, simply ask the caller to describe what they see.)     Bright red 5. ONSET: "When did the rash begin?"     Tues am 6. FEVER: "Do you have a fever?" If so, ask: "What is your temperature, how was it measured, and when did it start?"     no 7. ITCHING: "Does the rash itch?" If so, ask: "How bad is the itch?" (Scale 1-10; or mild, moderate, severe)     severe 8. CAUSE: "What do you think is causing the rash?"     meds 9. NEW MEDICATION: "What new medication are you taking?" (e.g., name of antibiotic) "When did you start taking this medication?".    Augmentin, Tessalon. 10. OTHER SYMPTOMS: "Do you have any other symptoms?" (e.g., sore throat, fever, joint pain)       no  Protocols used: RASH - WIDESPREAD ON DRUGS-A-AH

## 2017-12-20 NOTE — Assessment & Plan Note (Signed)
To d/c augmentin as above, for change to levaquin and cough med prn,  to f/u any worsening symptoms or concerns, also f/u cxr in 4 wks to f/u

## 2017-12-20 NOTE — Assessment & Plan Note (Addendum)
Has new allergic type rash, augmentin added to allergy list, for depomedrol IM 80, predpac asd, benadryl prn,  to f/u any worsening symptoms or concerns

## 2017-12-20 NOTE — Telephone Encounter (Signed)
Noted  

## 2017-12-20 NOTE — Progress Notes (Signed)
Subjective:    Patient ID: Jason Barton, male    DOB: 28-Nov-1925, 84 y.o.   MRN: [id number]  HPI    Here to f/u after recent dx left CAP tx with augmentin, but unfortunately with onset large rash with marked itching to the torso, only slight improved with the benedryl otc.  Overall less fever and feels chest symptoms some improved and con;t the antibx last PM.  Here with acute onset mild to mod 5 days ST, HA, general weakness and malaise, with prod cough greenish sputum, but Pt denies chest pain, increased sob or doe, wheezing, orthopnea, PND, increased LE swelling, palpitations, dizziness or syncope.  Pt denies new neurological symptoms such as new headache, or facial or extremity weakness or numbness   Pt denies polydipsia, polyuria,  Wt Readings from Last 3 Encounters:  12/20/17 135 lb (61.2 kg)  12/18/17 131 lb 1.9 oz (59.5 kg)  08/17/17 139 lb (63 kg)   Past Medical History:  Diagnosis Date  . COPD (chronic obstructive pulmonary disease) ([place]) 05/21/2011  . Diverticulosis of colon 05/21/2011  . Hypothyroidism   . Impaired glucose tolerance 06/08/2012  . Increased prostate specific antigen (PSA) velocity 05/24/2011  . Nonmelanoma skin cancer 05/21/2011  . S/P appendectomy 05/21/2011  . S/P inguinal hernia repair 05/21/2011  . S/p small bowel obstruction 05/21/2011  . Thoracic scoliosis 05/21/2011   Past Surgical History:  Procedure Laterality Date  . APPENDECTOMY  1939  . COLON RESECTION     2009 for a "kink in colon" adhesions after appendectomy  . SHOULDER SURGERY     dr sypher; rotater cuff  . STRABISMUS SURGERY Right 11/21/2014   Procedure: REPAIR STRABISMUS RIGHT EYE ;  Surgeon: Everitt Amber, MD;  Location: [facility];  Service: Ophthalmology;  Laterality: Right;  . TONSILLECTOMY  1942    reports that he has never smoked. He has never used smokeless tobacco. He reports that he drinks alcohol. He reports that he does not use drugs. family history includes  Arthritis in his other; Sudden death in his other. Allergies  Allergen Reactions  . Augmentin [Amoxicillin-Pot Clavulanate] Rash    Itching rash to all torso 2 days after start augmentin 12/2017   Current Outpatient Medications on File Prior to Visit  Medication Sig Dispense Refill  . benzonatate (TESSALON) 100 MG capsule Take 1 capsule (100 mg total) by mouth 3 (three) times daily as needed. 20 capsule 0  . Cholecalciferol (VITAMIN D PO) Take 5,000 Units by mouth every morning.    Marland Kitchen levothyroxine (SYNTHROID, LEVOTHROID) 50 MCG tablet Take 1 tablet (50 mcg total) by mouth daily before breakfast. 90 tablet 3  . neomycin-polymyxin b-dexamethasone (MAXITROL) 3.5-10000-0.1 OINT APPLY A SMALL AMOUNT TO LOWER EYELID INTO RIGHT EYE AT BEDTIME FOR 2 WEEKS *START AFTER SURGERY*  0  . Specialty Vitamins Products (PROSTATE PO) Take by mouth daily.       No current facility-administered medications on file prior to visit.    Review of Systems  Constitutional: Negative for other unusual diaphoresis or sweats HENT: Negative for ear discharge or swelling Eyes: Negative for other worsening visual disturbances Respiratory: Negative for stridor or other swelling  Gastrointestinal: Negative for worsening distension or other blood Genitourinary: Negative for retention or other urinary change Musculoskeletal: Negative for other MSK pain or swelling Skin: Negative for color change or other new lesions Neurological: Negative for worsening tremors and other numbness  Psychiatric/Behavioral: Negative for worsening agitation or other fatigue All other system neg  per pt    Objective:   Physical Exam BP 108/72   Pulse 62   Temp 97.8 F (36.6 C) (Oral)   Ht 5' 8.5" (1.74 m)   Wt 135 lb (61.2 kg)   SpO2 97%   BMI 20.23 kg/m  VS noted, mild ill, uncomfortable with itching torso Constitutional: Pt appears in NAD HENT: Head: NCAT.  Right Ear: External ear normal.  Left Ear: External ear normal.  Eyes: .  Pupils are equal, round, and reactive to light. Conjunctivae and EOM are normal Nose: without d/c or deformity Neck: Neck supple. Gross normal ROM Cardiovascular: Normal rate and regular rhythm.   Pulmonary/Chest: Effort normal and breath sounds without rales or wheezing.  Neurological: Pt is alert. At baseline orientation, motor grossly intact Skin: Skin is warm. + large erythem nontender plaqueliek rash to most of the front back and sides torso, small area to left hand web space near thumb, no other new lesions, no LE edema Psychiatric: Pt behavior is normal without agitation  No other exam findings Lab Results  Component Value Date   WBC 9.4 12/18/2017   HGB 14.2 12/18/2017   HCT 42.4 12/18/2017   PLT 133.0 (L) 12/18/2017   GLUCOSE 120 (H) 12/18/2017   CHOL 182 08/17/2017   TRIG 69.0 08/17/2017   HDL 69.00 08/17/2017   LDLCALC 99 08/17/2017   ALT 8 12/18/2017   AST 11 12/18/2017   NA 141 12/18/2017   K 3.7 12/18/2017   CL 104 12/18/2017   CREATININE 1.01 12/18/2017   BUN 16 12/18/2017   CO2 31 12/18/2017   TSH 3.79 12/18/2017   HGBA1C 5.3 08/17/2017       Assessment & Plan:

## 2017-12-20 NOTE — Assessment & Plan Note (Signed)
stable overall by history and exam, recent data reviewed with pt, and pt to continue medical treatment as before,  to f/u any worsening symptoms or concerns  

## 2017-12-20 NOTE — Assessment & Plan Note (Signed)
stable overall by history and exam, recent data reviewed with pt, and pt to continue medical treatment as before,  to f/u any worsening symptoms or concerns Lab Results  Component Value Date   HGBA1C 5.3 08/17/2017

## 2017-12-20 NOTE — Patient Instructions (Addendum)
OK to stop the augmentin   "Penicillin" is now a new allergy for you; please do not take any PCN type antibiotic in the future  You had the steroid shot today  Please take all new medication as prescribed - the prednisone, and lower dose short course of levaquin (antibiotic)  OK to continue Benadryl as needed for itching  Also ok to retry the miralax for constipation, and/or sennakot 1 per day as well  Please return in 4 weeks to the Xray for a follow up xray of the chest (there is no reminder for this, just go to xray in 4 wks at your convenience)  Please continue all other medications as before, and refills have been done if requested.  Please have the pharmacy call with any other refills you may need.  Please keep your appointments with your specialists as you may have planned

## 2017-12-25 ENCOUNTER — Telehealth: Payer: Self-pay

## 2017-12-25 NOTE — Telephone Encounter (Signed)
Copied from [place] [id number]. Topic: Inquiry >> Dec 25, 2017  9:03 AM Lennox Solders wrote: Reason for CRM: pt is calling to let dr Jenny Reichmann know he is on the road to recovery. The rash is gone and cough is 99 % better

## 2018-01-17 ENCOUNTER — Ambulatory Visit (INDEPENDENT_AMBULATORY_CARE_PROVIDER_SITE_OTHER)
Admission: RE | Admit: 2018-01-17 | Discharge: 2018-01-17 | Disposition: A | Discharge status: Home / Self Care | Payer: Medicare Other | Source: Ambulatory Visit | Attending: Internal Medicine | Admitting: Internal Medicine

## 2018-01-17 DIAGNOSIS — J189 Pneumonia, unspecified organism: Secondary | ICD-10-CM | POA: Diagnosis not present

## 2018-07-05 ENCOUNTER — Telehealth: Payer: Self-pay | Admitting: Internal Medicine

## 2018-07-05 NOTE — Telephone Encounter (Signed)
Attempted to call patient to schedule AWV. Patient did not answer. Will try to call patient back at a later time. SF

## 2018-08-21 ENCOUNTER — Encounter: Payer: Self-pay | Admitting: Internal Medicine

## 2018-08-21 ENCOUNTER — Other Ambulatory Visit (INDEPENDENT_AMBULATORY_CARE_PROVIDER_SITE_OTHER): Payer: Medicare Other

## 2018-08-21 ENCOUNTER — Ambulatory Visit (INDEPENDENT_AMBULATORY_CARE_PROVIDER_SITE_OTHER): Payer: Medicare Other | Admitting: Internal Medicine

## 2018-08-21 VITALS — BP 124/72 | HR 67 | Temp 97.6°F | Ht 68.5 in | Wt 136.0 lb

## 2018-08-21 DIAGNOSIS — R7302 Impaired glucose tolerance (oral): Secondary | ICD-10-CM | POA: Diagnosis not present

## 2018-08-21 DIAGNOSIS — Z0000 Encounter for general adult medical examination without abnormal findings: Secondary | ICD-10-CM

## 2018-08-21 DIAGNOSIS — R221 Localized swelling, mass and lump, neck: Secondary | ICD-10-CM

## 2018-08-21 LAB — URINALYSIS, ROUTINE W REFLEX MICROSCOPIC
Bilirubin Urine: NEGATIVE
HGB URINE DIPSTICK: NEGATIVE
Ketones, ur: NEGATIVE
Leukocytes,Ua: NEGATIVE
Nitrite: NEGATIVE
Specific Gravity, Urine: 1.02 (ref 1.000–1.030)
TOTAL PROTEIN, URINE-UPE24: NEGATIVE
Urine Glucose: NEGATIVE
Urobilinogen, UA: 0.2 (ref 0.0–1.0)
pH: 5.5 (ref 5.0–8.0)

## 2018-08-21 LAB — CBC WITH DIFFERENTIAL/PLATELET
Basophils Absolute: 0 10*3/uL (ref 0.0–0.1)
Basophils Relative: 0.6 % (ref 0.0–3.0)
Eosinophils Absolute: 0 10*3/uL (ref 0.0–0.7)
Eosinophils Relative: 0.5 % (ref 0.0–5.0)
HCT: 44.9 % (ref 39.0–52.0)
Hemoglobin: 15 g/dL (ref 13.0–17.0)
Lymphocytes Relative: 31.1 % (ref 12.0–46.0)
Lymphs Abs: 1.5 10*3/uL (ref 0.7–4.0)
MCHC: 33.4 g/dL (ref 30.0–36.0)
MCV: 91 fl (ref 78.0–100.0)
MONOS PCT: 11.5 % (ref 3.0–12.0)
Monocytes Absolute: 0.5 10*3/uL (ref 0.1–1.0)
NEUTROS ABS: 2.6 10*3/uL (ref 1.4–7.7)
Neutrophils Relative %: 56.3 % (ref 43.0–77.0)
Platelets: 141 10*3/uL — ABNORMAL LOW (ref 150.0–400.0)
RBC: 4.94 Mil/uL (ref 4.22–5.81)
RDW: 14.7 % (ref 11.5–15.5)
WBC: 4.7 10*3/uL (ref 4.0–10.5)

## 2018-08-21 LAB — HEPATIC FUNCTION PANEL
ALBUMIN: 4.2 g/dL (ref 3.5–5.2)
ALT: 11 U/L (ref 0–53)
AST: 14 U/L (ref 0–37)
Alkaline Phosphatase: 66 U/L (ref 39–117)
Bilirubin, Direct: 0.2 mg/dL (ref 0.0–0.3)
Total Bilirubin: 0.9 mg/dL (ref 0.2–1.2)
Total Protein: 6.2 g/dL (ref 6.0–8.3)

## 2018-08-21 LAB — TSH: TSH: 3.67 u[IU]/mL (ref 0.35–4.50)

## 2018-08-21 LAB — LIPID PANEL
CHOL/HDL RATIO: 2
Cholesterol: 167 mg/dL (ref 0–200)
HDL: 70.3 mg/dL (ref >39.00)
LDL Cholesterol: 85 mg/dL (ref 0–99)
NonHDL: 97.16
Triglycerides: 59 mg/dL (ref 0.0–149.0)
VLDL: 11.8 mg/dL (ref 0.0–40.0)

## 2018-08-21 LAB — BASIC METABOLIC PANEL
BUN: 14 mg/dL (ref 6–23)
CO2: 27 mEq/L (ref 19–32)
Calcium: 9 mg/dL (ref 8.4–10.5)
Chloride: 105 mEq/L (ref 96–112)
Creatinine, Ser: 0.84 mg/dL (ref 0.40–1.50)
GFR: 85.78 mL/min (ref >60.00)
Glucose, Bld: 83 mg/dL (ref 70–99)
Potassium: 4.2 mEq/L (ref 3.5–5.1)
Sodium: 141 mEq/L (ref 135–145)

## 2018-08-21 LAB — HEMOGLOBIN A1C: Hgb A1c MFr Bld: 5.2 % (ref 4.6–6.5)

## 2018-08-21 NOTE — Assessment & Plan Note (Signed)

## 2018-08-21 NOTE — Patient Instructions (Signed)
Please continue all other medications as before, and refills have been done if requested.  Please have the pharmacy call with any other refills you may need.  Please continue your efforts at being more active, low cholesterol diet, and weight control.  You are otherwise up to date with prevention measures today.  Please keep your appointments with your specialists as you may have planned  You will be contacted regarding the referral for: ENT  Please go to the LAB in the Basement (turn left off the elevator) for the tests to be done today  You will be contacted by phone if any changes need to be made immediately.  Otherwise, you will receive a letter about your results with an explanation, but please check with MyChart first.  Please remember to sign up for MyChart if you have not done so, as this will be important to you in the future with finding out test results, communicating by private email, and scheduling acute appointments online when needed.  Please return in 1 year for your yearly visit, or sooner if needed, with Lab testing done 3-5 days before

## 2018-08-21 NOTE — Assessment & Plan Note (Signed)
For ent referral 

## 2018-08-21 NOTE — Progress Notes (Signed)
Subjective:    Patient ID: Jason Barton, male    DOB: 1925/07/22, 85 y.o.   MRN: [id number]  HPI  Here for wellness and f/u;  Overall doing ok;  Pt denies Chest pain, worsening SOB, DOE, wheezing, orthopnea, PND, worsening LE edema, palpitations, dizziness or syncope.  Pt denies neurological change such as new headache, facial or extremity weakness.  Pt denies polydipsia, polyuria, or low sugar symptoms. Pt states overall good compliance with treatment and medications, good tolerability, and has been trying to follow appropriate diet.  Pt denies worsening depressive symptoms, suicidal ideation or panic. No fever, night sweats, wt loss, loss of appetite, or other constitutional symptoms.  Pt states good ability with ADL's, has low fall risk, home safety reviewed and adequate, no other significant changes in hearing or vision, and only occasionally active with exercise.  Thinks left neck mass possibly enlarging last few months Past Medical History:  Diagnosis Date  . COPD (chronic obstructive pulmonary disease) ([place]) 05/21/2011  . Diverticulosis of colon 05/21/2011  . Hypothyroidism   . Impaired glucose tolerance 06/08/2012  . Increased prostate specific antigen (PSA) velocity 05/24/2011  . Nonmelanoma skin cancer 05/21/2011  . S/P appendectomy 05/21/2011  . S/P inguinal hernia repair 05/21/2011  . S/p small bowel obstruction 05/21/2011  . Thoracic scoliosis 05/21/2011   Past Surgical History:  Procedure Laterality Date  . APPENDECTOMY  1939  . COLON RESECTION     2009 for a "kink in colon" adhesions after appendectomy  . SHOULDER SURGERY     dr sypher; rotater cuff  . STRABISMUS SURGERY Right 11/21/2014   Procedure: REPAIR STRABISMUS RIGHT EYE ;  Surgeon: Everitt Amber, MD;  Location: [facility];  Service: Ophthalmology;  Laterality: Right;  . TONSILLECTOMY  1942    reports that he has never smoked. He has never used smokeless tobacco. He reports current alcohol use. He  reports that he does not use drugs. family history includes Arthritis in an other family member; Sudden death in an other family member. Allergies  Allergen Reactions  . Augmentin [Amoxicillin-Pot Clavulanate] Rash    Itching rash to all torso 2 days after start augmentin 12/2017   Current Outpatient Medications on File Prior to Visit  Medication Sig Dispense Refill  . Cholecalciferol (VITAMIN D PO) Take 5,000 Units by mouth every morning.    Marland Kitchen levothyroxine (SYNTHROID, LEVOTHROID) 50 MCG tablet Take 1 tablet (50 mcg total) by mouth daily before breakfast. 90 tablet 3  . Specialty Vitamins Products (PROSTATE PO) Take by mouth daily.       No current facility-administered medications on file prior to visit.    Review of Systems Constitutional: Negative for other unusual diaphoresis, sweats, appetite or weight changes HENT: Negative for other worsening hearing loss, ear pain, facial swelling, mouth sores or neck stiffness.   Eyes: Negative for other worsening pain, redness or other visual disturbance.  Respiratory: Negative for other stridor or swelling Cardiovascular: Negative for other palpitations or other chest pain  Gastrointestinal: Negative for worsening diarrhea or loose stools, blood in stool, distention or other pain Genitourinary: Negative for hematuria, flank pain or other change in urine volume.  Musculoskeletal: Negative for myalgias or other joint swelling.  Skin: Negative for other color change, or other wound or worsening drainage.  Neurological: Negative for other syncope or numbness. Hematological: Negative for other adenopathy or swelling Psychiatric/Behavioral: Negative for hallucinations, other worsening agitation, SI, self-injury, or new decreased concentration All other system neg per pt  Objective:   Physical Exam BP 124/72   Pulse 67   Temp 97.6 F (36.4 C) (Oral)   Ht 5' 8.5" (1.74 m)   Wt 136 lb (61.7 kg)   SpO2 97%   BMI 20.38 kg/m  VS noted,    Constitutional: Pt is oriented to person, place, and time. Appears well-developed and well-nourished, in no significant distress and comfortable Head: Normocephalic and atraumatic  Eyes: Conjunctivae and EOM are normal. Pupils are equal, round, and reactive to light Right Ear: External ear normal without discharge Left Ear: External ear normal without discharge Nose: Nose without discharge or deformity Mouth/Throat: Oropharynx is without other ulcerations and moist  Neck: Normal range of motion. Neck supple. No JVD present. No tracheal deviation present or significant neck LA or mass Cardiovascular: Normal rate, regular rhythm, normal heart sounds and intact distal pulses.   Pulmonary/Chest: WOB normal and breath sounds without rales or wheezing  Abdominal: Soft. Bowel sounds are normal. NT. No HSM  Musculoskeletal: Normal range of motion. Exhibits no edema Lymphadenopathy: Has no other cervical adenopathy.  Neurological: Pt is alert and oriented to person, place, and time. Pt has normal reflexes. No cranial nerve deficit. Motor grossly intact, Gait intact Skin: Skin is warm and dry. No rash noted or new ulcerations Psychiatric:  Has normal mood and affect. Behavior is normal without agitation Left neck with prox SCM area hard subq mass, possibly multiple LA  Lab Results  Component Value Date   WBC 4.7 08/21/2018   HGB 15.0 08/21/2018   HCT 44.9 08/21/2018   PLT 141.0 (L) 08/21/2018   GLUCOSE 83 08/21/2018   CHOL 167 08/21/2018   TRIG 59.0 08/21/2018   HDL 70.30 08/21/2018   LDLCALC 85 08/21/2018   ALT 11 08/21/2018   AST 14 08/21/2018   NA 141 08/21/2018   K 4.2 08/21/2018   CL 105 08/21/2018   CREATININE 0.84 08/21/2018   BUN 14 08/21/2018   CO2 27 08/21/2018   TSH 3.67 08/21/2018   HGBA1C 5.2 08/21/2018         Assessment & Plan:

## 2018-08-21 NOTE — Assessment & Plan Note (Signed)
stable overall by history and exam, recent data reviewed with pt, and pt to continue medical treatment as before,  to f/u any worsening symptoms or concerns  

## 2018-12-05 ENCOUNTER — Telehealth: Payer: Self-pay

## 2018-12-05 NOTE — Telephone Encounter (Signed)
Returned call to pt regarding fasting with instructions that he was inquiring about

## 2018-12-29 ENCOUNTER — Other Ambulatory Visit: Payer: Self-pay | Admitting: Internal Medicine

## 2019-06-10 ENCOUNTER — Ambulatory Visit (INDEPENDENT_AMBULATORY_CARE_PROVIDER_SITE_OTHER): Payer: Medicare Other | Admitting: Internal Medicine

## 2019-06-10 ENCOUNTER — Other Ambulatory Visit: Payer: Self-pay

## 2019-06-10 ENCOUNTER — Encounter: Payer: Self-pay | Admitting: Internal Medicine

## 2019-06-10 DIAGNOSIS — E785 Hyperlipidemia, unspecified: Secondary | ICD-10-CM

## 2019-06-10 DIAGNOSIS — M545 Low back pain, unspecified: Secondary | ICD-10-CM

## 2019-06-10 DIAGNOSIS — G8929 Other chronic pain: Secondary | ICD-10-CM

## 2019-06-10 DIAGNOSIS — R7302 Impaired glucose tolerance (oral): Secondary | ICD-10-CM

## 2019-06-10 DIAGNOSIS — E039 Hypothyroidism, unspecified: Secondary | ICD-10-CM | POA: Diagnosis not present

## 2019-06-10 NOTE — Progress Notes (Addendum)
Subjective:    Patient ID: Jason Barton, male    DOB: Mar 06, 1926, 85 y.o.   MRN: [id number]  HPI  Here with left lower back pain x 4-6 mo, radiates to the left lateral side about t10 without rash, worse to bend with getting in and out of bed, mild to mod, intermittent, nothing else makes better or worse. No LE radicular symptoms   Denies worsening reflux, abd pain, dysphagia, n/v, bowel change or blood.  Denies urinary symptoms such as dysuria, frequency, urgency, flank pain, hematuria or n/v, fever, chills.  Pt denies chest pain, increased sob or doe, wheezing, orthopnea, PND, increased LE swelling, palpitations, dizziness or syncope.  Pt denies new neurological symptoms such as new headache, or facial or extremity weakness or numbness   Pt denies polydipsia, polyuria, Denies hyper or hypo thyroid symptoms such as voice, skin or hair change. Past Medical History:  Diagnosis Date  . COPD (chronic obstructive pulmonary disease) ([place]) 05/21/2011  . Diverticulosis of colon 05/21/2011  . Hypothyroidism   . Impaired glucose tolerance 06/08/2012  . Increased prostate specific antigen (PSA) velocity 05/24/2011  . Nonmelanoma skin cancer 05/21/2011  . S/P appendectomy 05/21/2011  . S/P inguinal hernia repair 05/21/2011  . S/p small bowel obstruction 05/21/2011  . Thoracic scoliosis 05/21/2011   Past Surgical History:  Procedure Laterality Date  . APPENDECTOMY  1939  . COLON RESECTION     2009 for a "kink in colon" adhesions after appendectomy  . SHOULDER SURGERY     dr sypher; rotater cuff  . STRABISMUS SURGERY Right 11/21/2014   Procedure: REPAIR STRABISMUS RIGHT EYE ;  Surgeon: Everitt Amber, MD;  Location: [facility];  Service: Ophthalmology;  Laterality: Right;  . TONSILLECTOMY  1942    reports that he has never smoked. He has never used smokeless tobacco. He reports current alcohol use. He reports that he does not use drugs. family history includes Arthritis in an other family  member; Sudden death in an other family member. Allergies  Allergen Reactions  . Augmentin [Amoxicillin-Pot Clavulanate] Rash    Itching rash to all torso 2 days after start augmentin 12/2017   Current Outpatient Medications on File Prior to Visit  Medication Sig Dispense Refill  . Cholecalciferol (VITAMIN D PO) Take 5,000 Units by mouth every morning.    Marland Kitchen levothyroxine (SYNTHROID) 50 MCG tablet TAKE 1 TABLET BY MOUTH DAILY BEFORE BREAKFAST 90 tablet 3  . Specialty Vitamins Products (PROSTATE PO) Take by mouth daily.      . Turmeric (QC TUMERIC COMPLEX PO) Take by mouth.    . Zinc 50 MG TABS Take by mouth.     No current facility-administered medications on file prior to visit.   Review of Systems  Constitutional: Negative for other unusual diaphoresis or sweats HENT: Negative for ear discharge or swelling Eyes: Negative for other worsening visual disturbances Respiratory: Negative for stridor or other swelling  Gastrointestinal: Negative for worsening distension or other blood Genitourinary: Negative for retention or other urinary change Musculoskeletal: Negative for other MSK pain or swelling Skin: Negative for color change or other new lesions Neurological: Negative for worsening tremors and other numbness  Psychiatric/Behavioral: Negative for worsening agitation or other fatigue All otherwise neg per pt     Objective:   Physical Exam BP 128/74 (BP Location: Left Arm)   Pulse 61   Temp 97.6 F (36.4 C) (Oral)   Ht 5' 5.5" (1.664 m)   Wt 136 lb 6.4 oz (61.9  kg)   SpO2 98%   BMI 22.35 kg/m  VS noted,  Constitutional: Pt appears in NAD HENT: Head: NCAT.  Right Ear: External ear normal.  Left Ear: External ear normal.  Eyes: . Pupils are equal, round, and reactive to light. Conjunctivae and EOM are normal Nose: without d/c or deformity Neck: Neck supple. Gross normal ROM Cardiovascular: Normal rate and regular rhythm.   Pulmonary/Chest: Effort normal and breath sounds  without rales or wheezing.  Abd:  Soft, NT, ND, + BS, no organomegaly Neurological: Pt is alert. At baseline orientation, motor grossly intact Skin: Skin is warm. No rashes, other new lesions, no LE edema Psychiatric: Pt behavior is normal without agitation  All otherwise neg per pt Lab Results  Component Value Date   WBC 4.7 08/21/2018   HGB 15.0 08/21/2018   HCT 44.9 08/21/2018   PLT 141.0 (L) 08/21/2018   GLUCOSE 83 08/21/2018   CHOL 167 08/21/2018   TRIG 59.0 08/21/2018   HDL 70.30 08/21/2018   LDLCALC 85 08/21/2018   ALT 11 08/21/2018   AST 14 08/21/2018   NA 141 08/21/2018   K 4.2 08/21/2018   CL 105 08/21/2018   CREATININE 0.84 08/21/2018   BUN 14 08/21/2018   CO2 27 08/21/2018   TSH 3.67 08/21/2018   HGBA1C 5.2 08/21/2018         Assessment & Plan:

## 2019-06-23 ENCOUNTER — Encounter: Payer: Self-pay | Admitting: Internal Medicine

## 2019-06-23 DIAGNOSIS — M545 Low back pain, unspecified: Secondary | ICD-10-CM | POA: Insufficient documentation

## 2019-06-23 NOTE — Patient Instructions (Signed)
Please continue all other medications as before, and refills have been done if requested.  Please have the pharmacy call with any other refills you may need.  Please continue your efforts at being more active, low cholesterol diet, and weight control.  Please keep your appointments with your specialists as you may have planned  Please consider seeing Sports Medicine for persistent or worsening pain  Please return in 3 months, or sooner if needed, with Lab testing done 3-5 days before

## 2019-06-23 NOTE — Assessment & Plan Note (Signed)
stable overall by history and exam, recent data reviewed with pt, and pt to continue medical treatment as before,  to f/u any worsening symptoms or concerns  

## 2019-06-23 NOTE — Assessment & Plan Note (Signed)
I suspect msk, will hold imaging or lab eval for now but consider if worsens, consider f/u sport med as well

## 2019-07-01 ENCOUNTER — Telehealth: Payer: Self-pay | Admitting: Internal Medicine

## 2019-07-01 NOTE — Telephone Encounter (Signed)
Left pt vm to call back to schedule. 

## 2019-07-01 NOTE — Telephone Encounter (Signed)
-----   Message from Cresenciano Lick, [place] sent at 07/01/2019  4:02 PM EST -----   ----- Message ----- From: Biagio Borg, MD Sent: 06/23/2019   5:34 AM EST To: Betsey Holiday Polk, CMA, Otilio Miu, CMA  Please contact pt to assist to make lab appt first floor a few days prior to next visit in 3 mo, if pt would like to do this

## 2019-07-17 ENCOUNTER — Ambulatory Visit: Payer: Medicare Other

## 2019-07-26 ENCOUNTER — Ambulatory Visit: Payer: Medicare Other | Attending: Internal Medicine

## 2019-07-26 DIAGNOSIS — Z23 Encounter for immunization: Secondary | ICD-10-CM | POA: Insufficient documentation

## 2019-07-26 NOTE — Progress Notes (Signed)
   Covid-19 Vaccination Clinic  Name:  Jason Barton    MRN: [id number] DOB: 1925-09-03  07/26/2019  Jason Barton was observed post Covid-19 immunization for 15 minutes without incidence. He was provided with Vaccine Information Sheet and instruction to access the V-Safe system.   Jason Barton was instructed to call 911 with any severe reactions post vaccine: Marland Kitchen Difficulty breathing  . Swelling of your face and throat  . A fast heartbeat  . A bad rash all over your body  . Dizziness and weakness    Immunizations Administered    Name Date Dose VIS Date Route   Pfizer COVID-19 Vaccine 07/26/2019 10:59 AM 0.3 mL 05/31/2019 Intramuscular   Manufacturer: [place]   Lot: [id number]   [place]: [id number]

## 2019-08-16 ENCOUNTER — Other Ambulatory Visit (INDEPENDENT_AMBULATORY_CARE_PROVIDER_SITE_OTHER): Payer: Medicare Other

## 2019-08-16 DIAGNOSIS — R7302 Impaired glucose tolerance (oral): Secondary | ICD-10-CM | POA: Diagnosis not present

## 2019-08-16 DIAGNOSIS — Z0000 Encounter for general adult medical examination without abnormal findings: Secondary | ICD-10-CM | POA: Diagnosis not present

## 2019-08-16 LAB — URINALYSIS, ROUTINE W REFLEX MICROSCOPIC
Bilirubin Urine: NEGATIVE
Hgb urine dipstick: NEGATIVE
Ketones, ur: NEGATIVE
Leukocytes,Ua: NEGATIVE
Nitrite: NEGATIVE
RBC / HPF: NONE SEEN (ref 0–?)
Specific Gravity, Urine: 1.025 (ref 1.000–1.030)
Total Protein, Urine: NEGATIVE
Urine Glucose: NEGATIVE
Urobilinogen, UA: 0.2 (ref 0.0–1.0)
pH: 5.5 (ref 5.0–8.0)

## 2019-08-16 LAB — HEPATIC FUNCTION PANEL
ALT: 8 U/L (ref 0–53)
AST: 13 U/L (ref 0–37)
Albumin: 4 g/dL (ref 3.5–5.2)
Alkaline Phosphatase: 68 U/L (ref 39–117)
Bilirubin, Direct: 0.2 mg/dL (ref 0.0–0.3)
Total Bilirubin: 0.9 mg/dL (ref 0.2–1.2)
Total Protein: 6.6 g/dL (ref 6.0–8.3)

## 2019-08-16 LAB — TSH: TSH: 4.12 u[IU]/mL (ref 0.35–4.50)

## 2019-08-16 LAB — CBC WITH DIFFERENTIAL/PLATELET
Basophils Absolute: 0 10*3/uL (ref 0.0–0.1)
Basophils Relative: 0.6 % (ref 0.0–3.0)
Eosinophils Absolute: 0 10*3/uL (ref 0.0–0.7)
Eosinophils Relative: 0.8 % (ref 0.0–5.0)
HCT: 43.5 % (ref 39.0–52.0)
Hemoglobin: 14.7 g/dL (ref 13.0–17.0)
Lymphocytes Relative: 36.4 % (ref 12.0–46.0)
Lymphs Abs: 1.8 10*3/uL (ref 0.7–4.0)
MCHC: 33.7 g/dL (ref 30.0–36.0)
MCV: 90.2 fl (ref 78.0–100.0)
Monocytes Absolute: 0.6 10*3/uL (ref 0.1–1.0)
Monocytes Relative: 12.4 % — ABNORMAL HIGH (ref 3.0–12.0)
Neutro Abs: 2.4 10*3/uL (ref 1.4–7.7)
Neutrophils Relative %: 49.8 % (ref 43.0–77.0)
Platelets: 145 10*3/uL — ABNORMAL LOW (ref 150.0–400.0)
RBC: 4.83 Mil/uL (ref 4.22–5.81)
RDW: 14.2 % (ref 11.5–15.5)
WBC: 4.8 10*3/uL (ref 4.0–10.5)

## 2019-08-16 LAB — LIPID PANEL
Cholesterol: 171 mg/dL (ref 0–200)
HDL: 66.5 mg/dL (ref >39.00)
LDL Cholesterol: 93 mg/dL (ref 0–99)
NonHDL: 104.28
Total CHOL/HDL Ratio: 3
Triglycerides: 58 mg/dL (ref 0.0–149.0)
VLDL: 11.6 mg/dL (ref 0.0–40.0)

## 2019-08-16 LAB — BASIC METABOLIC PANEL
BUN: 14 mg/dL (ref 6–23)
CO2: 29 mEq/L (ref 19–32)
Calcium: 9 mg/dL (ref 8.4–10.5)
Chloride: 106 mEq/L (ref 96–112)
Creatinine, Ser: 0.91 mg/dL (ref 0.40–1.50)
GFR: 78.04 mL/min (ref >60.00)
Glucose, Bld: 91 mg/dL (ref 70–99)
Potassium: 3.5 mEq/L (ref 3.5–5.1)
Sodium: 142 mEq/L (ref 135–145)

## 2019-08-16 LAB — HEMOGLOBIN A1C: Hgb A1c MFr Bld: 5.1 % (ref 4.6–6.5)

## 2019-08-20 ENCOUNTER — Ambulatory Visit: Payer: Medicare Other | Attending: Internal Medicine

## 2019-08-20 DIAGNOSIS — Z23 Encounter for immunization: Secondary | ICD-10-CM | POA: Insufficient documentation

## 2019-08-20 NOTE — Progress Notes (Signed)
   Covid-19 Vaccination Clinic  Name:  JARQUIS REICHL    MRN: [id number] DOB: 1925-09-02  08/20/2019  Mr. Panciera was observed post Covid-19 immunization for 15 minutes without incident. He was provided with Vaccine Information Sheet and instruction to access the V-Safe system.   Mr. Maggart was instructed to call 911 with any severe reactions post vaccine: Marland Kitchen Difficulty breathing  . Swelling of face and throat  . A fast heartbeat  . A bad rash all over body  . Dizziness and weakness   Immunizations Administered    Name Date Dose VIS Date Route   Pfizer COVID-19 Vaccine 08/20/2019 11:50 AM 0.3 mL 05/31/2019 Intramuscular   Manufacturer: [place]   Lot: [id number]   [place]: [id number]

## 2019-08-22 ENCOUNTER — Other Ambulatory Visit: Payer: Self-pay

## 2019-08-22 ENCOUNTER — Ambulatory Visit (INDEPENDENT_AMBULATORY_CARE_PROVIDER_SITE_OTHER): Payer: Medicare Other | Admitting: Internal Medicine

## 2019-08-22 ENCOUNTER — Encounter: Payer: Self-pay | Admitting: Internal Medicine

## 2019-08-22 VITALS — BP 130/76 | HR 64 | Temp 97.6°F | Ht 65.5 in | Wt 135.0 lb

## 2019-08-22 DIAGNOSIS — R7302 Impaired glucose tolerance (oral): Secondary | ICD-10-CM

## 2019-08-22 DIAGNOSIS — Z0000 Encounter for general adult medical examination without abnormal findings: Secondary | ICD-10-CM

## 2019-08-22 DIAGNOSIS — J449 Chronic obstructive pulmonary disease, unspecified: Secondary | ICD-10-CM

## 2019-08-22 NOTE — Progress Notes (Signed)
Subjective:    Patient ID: Jason Barton, male    DOB: Aug 05, 1925, 86 y.o.   MRN: [id number]  HPI  Here for wellness and f/u;  Overall doing ok;  Pt denies Chest pain, worsening SOB, DOE, wheezing, orthopnea, PND, worsening LE edema, palpitations, dizziness or syncope.  Pt denies neurological change such as new headache, facial or extremity weakness.  Pt denies polydipsia, polyuria, or low sugar symptoms. Pt states overall good compliance with treatment and medications, good tolerability, and has been trying to follow appropriate diet.  Pt denies worsening depressive symptoms, suicidal ideation or panic. No fever, night sweats, wt loss, loss of appetite, or other constitutional symptoms.  Pt states good ability with ADL's, has low fall risk, home safety reviewed and adequate, no other significant changes in hearing or vision, and only occasionally active with exercise.  Balance somewhat worse in the past few months.  S/p covid vaccine x 2.  No new complaints Past Medical History:  Diagnosis Date  . COPD (chronic obstructive pulmonary disease) ([place]) 05/21/2011  . Diverticulosis of colon 05/21/2011  . Hypothyroidism   . Impaired glucose tolerance 06/08/2012  . Increased prostate specific antigen (PSA) velocity 05/24/2011  . Nonmelanoma skin cancer 05/21/2011  . S/P appendectomy 05/21/2011  . S/P inguinal hernia repair 05/21/2011  . S/p small bowel obstruction 05/21/2011  . Thoracic scoliosis 05/21/2011   Past Surgical History:  Procedure Laterality Date  . APPENDECTOMY  1939  . COLON RESECTION     2009 for a "kink in colon" adhesions after appendectomy  . SHOULDER SURGERY     dr sypher; rotater cuff  . STRABISMUS SURGERY Right 11/21/2014   Procedure: REPAIR STRABISMUS RIGHT EYE ;  Surgeon: Everitt Amber, MD;  Location: [facility];  Service: Ophthalmology;  Laterality: Right;  . TONSILLECTOMY  1942    reports that he has never smoked. He has never used smokeless tobacco. He  reports current alcohol use. He reports that he does not use drugs. family history includes Arthritis in an other family member; Sudden death in an other family member. Allergies  Allergen Reactions  . Augmentin [Amoxicillin-Pot Clavulanate] Rash    Itching rash to all torso 2 days after start augmentin 12/2017   Current Outpatient Medications on File Prior to Visit  Medication Sig Dispense Refill  . Ascorbic Acid (VITAMIN C WITH ROSE HIPS) 500 MG tablet Take 500 mg by mouth daily.    . Cholecalciferol (VITAMIN D PO) Take 5,000 Units by mouth every morning.    Marland Kitchen levothyroxine (SYNTHROID) 50 MCG tablet TAKE 1 TABLET BY MOUTH DAILY BEFORE BREAKFAST 90 tablet 3  . selenium 50 MCG TABS tablet Take 50 mcg by mouth daily.    Marland Kitchen Specialty Vitamins Products (PROSTATE PO) Take by mouth daily.      . Turmeric (QC TUMERIC COMPLEX PO) Take by mouth.    . Zinc 50 MG TABS Take by mouth.     No current facility-administered medications on file prior to visit.   Review of Systems All otherwise neg per pt     Objective:   Physical Exam BP 130/76   Pulse 64   Temp 97.6 F (36.4 C)   Ht 5' 5.5" (1.664 m)   Wt 135 lb (61.2 kg)   SpO2 99%   BMI 22.12 kg/m  VS noted,  Constitutional: Pt appears in NAD HENT: Head: NCAT.  Right Ear: External ear normal.  Left Ear: External ear normal.  Eyes: . Pupils are equal,  round, and reactive to light. Conjunctivae and EOM are normal Nose: without d/c or deformity Neck: Neck supple. Gross normal ROM Cardiovascular: Normal rate and regular rhythm.   Pulmonary/Chest: Effort normal and breath sounds without rales or wheezing.  Abd:  Soft, NT, ND, + BS, no organomegaly Neurological: Pt is alert. At baseline orientation, motor grossly intact Skin: Skin is warm. No rashes, other new lesions, no LE edema Psychiatric: Pt behavior is normal without agitation  All otherwise neg per pt  Lab Results  Component Value Date   WBC 4.8 08/16/2019   HGB 14.7 08/16/2019    HCT 43.5 08/16/2019   PLT 145.0 (L) 08/16/2019   GLUCOSE 91 08/16/2019   CHOL 171 08/16/2019   TRIG 58.0 08/16/2019   HDL 66.50 08/16/2019   LDLCALC 93 08/16/2019   ALT 8 08/16/2019   AST 13 08/16/2019   NA 142 08/16/2019   K 3.5 08/16/2019   CL 106 08/16/2019   CREATININE 0.91 08/16/2019   BUN 14 08/16/2019   CO2 29 08/16/2019   TSH 4.12 08/16/2019   HGBA1C 5.1 08/16/2019      Assessment & Plan:

## 2019-08-22 NOTE — Patient Instructions (Addendum)
Please continue all other medications as before, and refills have been done if requested. ° °Please have the pharmacy call with any other refills you may need. ° °Please continue your efforts at being more active, low cholesterol diet, and weight control. ° °You are otherwise up to date with prevention measures today. ° °Please keep your appointments with your specialists as you may have planned ° °Please make an Appointment to return in 6 months, or sooner if needed °

## 2019-08-24 ENCOUNTER — Encounter: Payer: Self-pay | Admitting: Internal Medicine

## 2019-08-24 NOTE — Assessment & Plan Note (Signed)
stable overall by history and exam, recent data reviewed with pt, and pt to continue medical treatment as before,  to f/u any worsening symptoms or concerns  

## 2019-08-24 NOTE — Assessment & Plan Note (Signed)

## 2019-09-26 ENCOUNTER — Other Ambulatory Visit: Payer: Self-pay

## 2019-09-26 ENCOUNTER — Encounter: Payer: Self-pay | Admitting: Internal Medicine

## 2019-09-26 ENCOUNTER — Ambulatory Visit: Payer: Medicare Other | Admitting: Internal Medicine

## 2019-09-26 ENCOUNTER — Ambulatory Visit (INDEPENDENT_AMBULATORY_CARE_PROVIDER_SITE_OTHER): Payer: Medicare Other

## 2019-09-26 VITALS — BP 162/88 | HR 55 | Temp 97.7°F | Resp 16 | Ht 65.5 in | Wt 135.5 lb

## 2019-09-26 DIAGNOSIS — R0789 Other chest pain: Secondary | ICD-10-CM | POA: Insufficient documentation

## 2019-09-26 DIAGNOSIS — S2232XA Fracture of one rib, left side, initial encounter for closed fracture: Secondary | ICD-10-CM | POA: Diagnosis not present

## 2019-09-26 MED ORDER — OXYCODONE HCL 5 MG PO TABS: 5.0000 mg | ORAL_TABLET | Freq: Four times a day (QID) | ORAL | 0 refills | Status: DC | PRN

## 2019-09-26 NOTE — Patient Instructions (Signed)
Rib Fracture  A rib fracture is a break or crack in one of the bones of the ribs. The ribs are long, curved bones that wrap around your chest and attach to your spine and your breastbone. The ribs protect your heart, lungs, and other organs in the chest. A broken or cracked rib is often painful but is not usually serious. Most rib fractures heal on their own over time. However, rib fractures can be more serious if multiple ribs are broken or if broken ribs move out of place and push against other structures or organs. What are the causes? This condition is caused by:  Repetitive movements with high force, such as pitching a baseball or having severe coughing spells.  A direct blow to the chest, such as a sports injury, a car accident, or a fall.  Cancer that has spread to the bones, which can weaken bones and cause them to break. What are the signs or symptoms? Symptoms of this condition include:  Pain when you breathe in or cough.  Pain when someone presses on the injured area.  Feeling short of breath. How is this diagnosed? This condition is diagnosed with a physical exam and medical history. Imaging tests may also be done, such as:  Chest X-ray.  CT scan.  MRI.  Bone scan.  Chest ultrasound. How is this treated? Treatment for this condition depends on the severity of the fracture. Most rib fractures usually heal on their own in 1-3 months. Sometimes healing takes longer if there is a cough that does not stop or if there are other activities that make the injury worse (aggravating factors). While you heal, you will be given medicines to control the pain. You will also be taught deep breathing exercises. Severe injuries may require hospitalization or surgery. Follow these instructions at home: Managing pain, stiffness, and swelling  If directed, apply ice to the injured area. ? Put ice in a plastic bag. ? Place a towel between your skin and the bag. ? Leave the ice on for  20 minutes, 2-3 times a day.  Take over-the-counter and prescription medicines only as told by your health care provider. Activity  Avoid a lot of activity and any activities or movements that cause pain. Be careful during activities and avoid bumping the injured rib.  Slowly increase your activity as told by your health care provider. General instructions  Do deep breathing exercises as told by your health care provider. This helps prevent pneumonia, which is a common complication of a broken rib. Your health care provider may instruct you to: ? Take deep breaths several times a day. ? Try to cough several times a day, holding a pillow against the injured area. ? Use a device called incentive spirometer to practice deep breathing several times a day.  Drink enough fluid to keep your urine pale yellow.  Do not wear a rib belt or binder. These restrict breathing, which can lead to pneumonia.  Keep all follow-up visits as told by your health care provider. This is important. Contact a health care provider if:  You have a fever. Get help right away if:  You have difficulty breathing or you are short of breath.  You develop a cough that does not stop, or you cough up thick or bloody sputum.  You have nausea, vomiting, or pain in your abdomen.  Your pain gets worse and medicine does not help. Summary  A rib fracture is a break or crack in one of  the bones of the ribs. °· A broken or cracked rib is often painful but is not usually serious. °· Most rib fractures heal on their own over time. °· Treatment for this condition depends on the severity of the fracture. °· Avoid a lot of activity and any activities or movements that cause pain. °This information is not intended to replace advice given to you by your health care provider. Make sure you discuss any questions you have with your health care provider. °Document Revised: 05/19/2017 Document Reviewed: 09/05/2016 °Elsevier Patient  Education © 2020 [employer] Inc. ° °

## 2019-09-26 NOTE — Progress Notes (Signed)
Subjective:  Patient ID: Jason Barton, male    DOB: [birth year]  Age: 86 y.o. MRN: [id number]  CC: No chief complaint on file.  NEW TO ME  HPI [name] presents for a 2 day hx of left lower rib cage pain.  He reports that 2 days ago he twisted to the left and felt the acute onset of a sharp pain in his left lower rib cage.  Not gotten much symptom relief with Advil.  He denies shortness of breath, hemoptysis, wheezing, fever, chills, abdominal pain, nausea, vomiting, dysuria, or hematuria.  Outpatient Medications Prior to Visit  Medication Sig Dispense Refill   Ascorbic Acid (VITAMIN C WITH ROSE HIPS) 500 MG tablet Take 500 mg by mouth daily.     Cholecalciferol (VITAMIN D PO) Take 5,000 Units by mouth every morning.     levothyroxine (SYNTHROID) 50 MCG tablet TAKE 1 TABLET BY MOUTH DAILY BEFORE BREAKFAST 90 tablet 3   selenium 50 MCG TABS tablet Take 50 mcg by mouth daily.     Specialty Vitamins Products (PROSTATE PO) Take by mouth daily.       Turmeric (QC TUMERIC COMPLEX PO) Take by mouth.     Zinc 50 MG TABS Take by mouth.     No facility-administered medications prior to visit.    ROS Review of Systems  Constitutional: Negative.  Negative for chills, diaphoresis, fatigue and fever.  HENT: Negative.   Eyes: Negative for visual disturbance.  Respiratory: Negative for cough, chest tightness, shortness of breath and wheezing.   Cardiovascular: Positive for chest pain. Negative for palpitations and leg swelling.  Gastrointestinal: Negative for abdominal pain, blood in stool, constipation, diarrhea, nausea and vomiting.  Endocrine: Negative.   Genitourinary: Negative.  Negative for difficulty urinating.  Musculoskeletal: Negative.  Negative for arthralgias and myalgias.  Skin: Negative.  Negative for color change and rash.  Neurological: Negative.  Negative for dizziness, weakness and light-headedness.  Hematological: Negative for adenopathy. Does not  bruise/bleed easily.  Psychiatric/Behavioral: Negative.     Objective:  BP (!) 162/88 (BP Location: Left Arm, Patient Position: Sitting, Cuff Size: Normal)    Pulse (!) 55    Temp 97.7 F (36.5 C) (Oral)    Resp 16    Ht 5' 5.5" (1.664 m)    Wt 135 lb 8 oz (61.5 kg)    SpO2 95%    BMI 22.21 kg/m   BP Readings from Last 3 Encounters:  09/26/19 (!) 162/88  08/22/19 130/76  06/10/19 128/74    Wt Readings from Last 3 Encounters:  09/26/19 135 lb 8 oz (61.5 kg)  08/22/19 135 lb (61.2 kg)  06/10/19 136 lb 6.4 oz (61.9 kg)    Physical Exam Constitutional:      General: He is not in acute distress.    Appearance: He is not ill-appearing, toxic-appearing or diaphoretic.  HENT:     Nose: Nose normal.     Mouth/Throat:     Mouth: Mucous membranes are moist.  Eyes:     General: No scleral icterus.    Conjunctiva/sclera: Conjunctivae normal.  Cardiovascular:     Rate and Rhythm: Normal rate and regular rhythm.     Heart sounds: No murmur. No friction rub.  Pulmonary:     Breath sounds: No stridor. No wheezing, rhonchi or rales.  Chest:     Chest wall: Tenderness present. No mass, deformity, swelling, crepitus or edema.    Abdominal:     General: Abdomen is flat.  Palpations: There is no mass.     Tenderness: There is no abdominal tenderness. There is no guarding or rebound.  Musculoskeletal:        General: Normal range of motion.     Cervical back: Normal range of motion.     Right lower leg: No edema.     Left lower leg: No edema.  Skin:    Findings: No rash.  Neurological:     General: No focal deficit present.     Mental Status: He is alert.  Psychiatric:        Mood and Affect: Mood normal.        Behavior: Behavior normal.     Lab Results  Component Value Date   WBC 4.8 08/16/2019   HGB 14.7 08/16/2019   HCT 43.5 08/16/2019   PLT 145.0 (L) 08/16/2019   GLUCOSE 91 08/16/2019   CHOL 171 08/16/2019   TRIG 58.0 08/16/2019   HDL 66.50 08/16/2019   LDLCALC  93 08/16/2019   ALT 8 08/16/2019   AST 13 08/16/2019   NA 142 08/16/2019   K 3.5 08/16/2019   CL 106 08/16/2019   CREATININE 0.91 08/16/2019   BUN 14 08/16/2019   CO2 29 08/16/2019   TSH 4.12 08/16/2019   HGBA1C 5.1 08/16/2019    DG Chest 2 View  Result Date: 01/17/2018 CLINICAL DATA:  Follow-up abnormal chest x-ray EXAM: CHEST - 2 VIEW COMPARISON:  12/18/2017, 07/28/2015 FINDINGS: Hyperinflation with emphysematous disease. The previously noted left upper lobe opacity is not clearly visible on the current study. Biapical pleural and parenchymal scarring. Stable cardiomediastinal silhouette with atherosclerosis. No pneumothorax. Scoliosis with multiple thoracic compression deformities as before. IMPRESSION: 1. Previously noted left upper lobe opacity is not clearly identified on the current study 2. Hyperinflation with emphysematous disease. Electronically Signed   By: Donavan Foil M.D.   On: 01/17/2018 14:50    DG Chest 2 View  Result Date: 09/26/2019 CLINICAL DATA:  86 year old male with left lower rib pain. No known injury. EXAM: LEFT RIBS - 2 VIEW; CHEST - 2 VIEW COMPARISON:  Chest radiograph dated 01/17/2018. FINDINGS: Background of emphysema. No focal consolidation, pleural effusion, or pneumothorax. The cardiac silhouette is within normal limits. Atherosclerotic calcification of the aortic arch. Osteopenia with degenerative changes of spine. There is cortical discontinuity of the lateral aspect of the left eighth rib concerning for a fracture. IMPRESSION: 1. Probable nondisplaced fracture of the lateral left eighth rib. No pneumothorax. 2. Emphysema. Electronically Signed   By: Anner Crete M.D.   On: 09/26/2019 16:53   DG Ribs Unilateral Left  Result Date: 09/26/2019 CLINICAL DATA:  86 year old male with left lower rib pain. No known injury. EXAM: LEFT RIBS - 2 VIEW; CHEST - 2 VIEW COMPARISON:  Chest radiograph dated 01/17/2018. FINDINGS: Background of emphysema. No focal  consolidation, pleural effusion, or pneumothorax. The cardiac silhouette is within normal limits. Atherosclerotic calcification of the aortic arch. Osteopenia with degenerative changes of spine. There is cortical discontinuity of the lateral aspect of the left eighth rib concerning for a fracture. IMPRESSION: 1. Probable nondisplaced fracture of the lateral left eighth rib. No pneumothorax. 2. Emphysema. Electronically Signed   By: Anner Crete M.D.   On: 09/26/2019 16:53    Assessment & Plan:   Diagnoses and all orders for this visit:  Left-sided chest wall pain -     DG Ribs Unilateral Left; Future -     DG Chest 2 View; Future -  oxyCODONE (OXY IR/ROXICODONE) 5 MG immediate release tablet; Take 1 tablet (5 mg total) by mouth every 6 (six) hours as needed for severe pain.  Fracture of one rib, left side, initial encounter for closed fracture- He has pain from an uncomplicated rib fracture in the left #8.  He is not getting adequate symptom relief with ibuprofen so I recommended that he try oxycodone as needed. -     oxyCODONE (OXY IR/ROXICODONE) 5 MG immediate release tablet; Take 1 tablet (5 mg total) by mouth every 6 (six) hours as needed for severe pain.   I am having Alyson Locket. Wolfgang start on oxyCODONE. I am also having him maintain his Specialty Vitamins Products (PROSTATE PO), Cholecalciferol (VITAMIN D PO), levothyroxine, Zinc, Turmeric (QC TUMERIC COMPLEX PO), vitamin C with rose hips, and selenium.  Meds ordered this encounter  Medications   oxyCODONE (OXY IR/ROXICODONE) 5 MG immediate release tablet    Sig: Take 1 tablet (5 mg total) by mouth every 6 (six) hours as needed for severe pain.    Dispense:  35 tablet    Refill:  0     Follow-up: No follow-ups on file.  Scarlette Calico, MD

## 2019-10-15 ENCOUNTER — Encounter: Payer: Self-pay | Admitting: Internal Medicine

## 2019-10-21 ENCOUNTER — Ambulatory Visit: Payer: Medicare Other

## 2019-11-01 ENCOUNTER — Ambulatory Visit (INDEPENDENT_AMBULATORY_CARE_PROVIDER_SITE_OTHER): Payer: Medicare Other

## 2019-11-01 DIAGNOSIS — Z0000 Encounter for general adult medical examination without abnormal findings: Secondary | ICD-10-CM

## 2019-11-01 NOTE — Progress Notes (Signed)
I connected with patient today by telephone and verified that I am speaking with the correct person using two identifiers. Location patient: home Location provider: work Persons participating in the virtual visit: patient and Lisette Abu, LPN ([place])  I discussed the limitations, risks, security and privacy concerns of performing an evaluation and management service by telephone and the availability of in person appointments. I also discussed with the patient that there may be a patient responsible charge related to this service. The patient expressed understanding and verbally consented to this telephonic visit.    Interactive audio and video telecommunications were attempted between this provider and patient, however failed, due to patient having technical difficulties OR patient did not have access to video capability.  We continued and completed visit with audio only.  Some vital signs may be absent or patient reported.   Time Spent with patient on telephone encounter: 45 minutes  Subjective:   Jason Barton is a 86 y.o. male who presents for Medicare Annual/Subsequent preventive examination.  Review of Systems:  No ROS. Medicare Wellness Virtual Visit. Additional risk factors are reflected in social history. Cardiac Risk Factors include: advanced age (>64men, >9 women);dyslipidemia;male gender;hypertension  Sleep Patterns: No sleep issues, feels rested on waking and sleeps 7-8 hours nightly; gets up to 1-2 times to void. Home Safety/Smoke Alarms: Feels safe in home; uses home alarm. Smoke alarms in place. Living environment: 2-story home; Lives with spouse; no needs for DME; good support system. Seat Belt Safety/Bike Helmet: Wears seat belt.     Objective:    Vitals: There were no vitals taken for this visit.  There is no height or weight on file to calculate BMI.  Advanced Directives 11/01/2019 08/16/2016 11/21/2014 11/20/2014  Does Patient Have a Medical  Advance Directive? Yes Yes Yes Yes  Type of Paramedic of [place];Living will Living will - -  Does patient want to make changes to medical advance directive? No - Patient declined - - -  Copy of [place] in Chart? No - copy requested - No - copy requested -    Tobacco Social History   Tobacco Use  Smoking Status Never Smoker  Smokeless Tobacco Never Used     Counseling given: Not Answered   Clinical Intake:  Pre-visit preparation completed: Yes  Pain : No/denies pain     Diabetes: No  How often do you need to have someone help you when you read instructions, pamphlets, or other written materials from your doctor or pharmacy?: 1 - Never What is the last grade level you completed in school?: HSG  Interpreter Needed?: No  Information entered by :: Shenika N. Lowell Guitar, LPN  Past Medical History:  Diagnosis Date  . COPD (chronic obstructive pulmonary disease) ([place]) 05/21/2011  . Diverticulosis of colon 05/21/2011  . Hypothyroidism   . Impaired glucose tolerance 06/08/2012  . Increased prostate specific antigen (PSA) velocity 05/24/2011  . Nonmelanoma skin cancer 05/21/2011  . S/P appendectomy 05/21/2011  . S/P inguinal hernia repair 05/21/2011  . S/p small bowel obstruction 05/21/2011  . Thoracic scoliosis 05/21/2011   Past Surgical History:  Procedure Laterality Date  . APPENDECTOMY  1939  . COLON RESECTION     2009 for a "kink in colon" adhesions after appendectomy  . SHOULDER SURGERY     dr sypher; rotater cuff  . STRABISMUS SURGERY Right 11/21/2014   Procedure: REPAIR STRABISMUS RIGHT EYE ;  Surgeon: Everitt Amber, MD;  Location: [facility];  Service: Ophthalmology;  Laterality: Right;  . TONSILLECTOMY  1942   Family History  Problem Relation Age of Onset  . Arthritis Other   . Sudden death Other    Social History   Socioeconomic History  . Marital status: Married    Spouse name: Not on file  . Number  of children: Not on file  . Years of education: MBA  . Highest education level: Not on file  Occupational History  . Occupation: retired  Tobacco Use  . Smoking status: Never Smoker  . Smokeless tobacco: Never Used  Substance and Sexual Activity  . Alcohol use: Yes    Comment:  Drinks 3 drinks a year  . Drug use: No  . Sexual activity: Not on file  Other Topics Concern  . Not on file  Social History Narrative  . Not on file   Social Determinants of Health   Financial Resource Strain:   . Difficulty of Paying Living Expenses:   Food Insecurity:   . Worried About Charity fundraiser in the Last Year:   . Arboriculturist in the Last Year:   Transportation Needs:   . Film/video editor (Medical):   Marland Kitchen Lack of Transportation (Non-Medical):   Physical Activity:   . Days of Exercise per Week:   . Minutes of Exercise per Session:   Stress:   . Feeling of Stress :   Social Connections:   . Frequency of Communication with Friends and Family:   . Frequency of Social Gatherings with Friends and Family:   . Attends Religious Services:   . Active Member of Clubs or Organizations:   . Attends Archivist Meetings:   Marland Kitchen Marital Status:     Outpatient Encounter Medications as of 11/01/2019  Medication Sig  . Ascorbic Acid (VITAMIN C WITH ROSE HIPS) 500 MG tablet Take 500 mg by mouth daily.  . Cholecalciferol (VITAMIN D PO) Take 5,000 Units by mouth every morning.  Marland Kitchen levothyroxine (SYNTHROID) 50 MCG tablet TAKE 1 TABLET BY MOUTH DAILY BEFORE BREAKFAST  . selenium 50 MCG TABS tablet Take 50 mcg by mouth daily.  Marland Kitchen Specialty Vitamins Products (PROSTATE PO) Take by mouth daily.    . Turmeric (QC TUMERIC COMPLEX PO) Take by mouth.  . Zinc 50 MG TABS Take by mouth.  . oxyCODONE (OXY IR/ROXICODONE) 5 MG immediate release tablet Take 1 tablet (5 mg total) by mouth every 6 (six) hours as needed for severe pain. (Patient not taking: Reported on 11/01/2019)   No facility-administered  encounter medications on file as of 11/01/2019.    Activities of Daily Living In your present state of health, do you have any difficulty performing the following activities: 11/01/2019  Hearing? N  Vision? N  Difficulty concentrating or making decisions? N  Walking or climbing stairs? N  Dressing or bathing? N  Doing errands, shopping? N  Preparing Food and eating ? N  Using the Toilet? N  In the past six months, have you accidently leaked urine? N  Do you have problems with loss of bowel control? N  Managing your Medications? N  Managing your Finances? N  Housekeeping or managing your Housekeeping? N  Some recent data might be hidden    Patient Care Team: Biagio Borg, MD as PCP - General (Internal Medicine)   Assessment:   This is a routine wellness examination for Jason Barton.  Exercise Activities and Dietary recommendations Current Exercise Habits: Home exercise routine,  Type of exercise: walking(walks 2 miles per day), Time (Minutes): 30, Frequency (Times/Week): 5, Weekly Exercise (Minutes/Week): 150, Intensity: Moderate, Exercise limited by: None identified  Goals    . <enter goal here>     Maintain healthy life    . Patient Stated     To maintain my current health status by continuing to eat healthy and stay physically active & socially active.       Fall Risk Fall Risk  11/01/2019 08/22/2019 06/10/2019 08/21/2018 08/17/2017  Falls in the past year? 1 0 0 0 No  Number falls in past yr: 0 - 0 - -  Injury with Fall? 0 - 0 - -  Risk for fall due to : No Fall Risks - - - -  Follow up Falls evaluation completed;Falls prevention discussed - - - -   Is the patient's home free of loose throw rugs in walkways, pet beds, electrical cords, etc?   yes      Grab bars in the bathroom? yes      Handrails on the stairs?   yes      Adequate lighting?   yes    Depression Screen PHQ 2/9 Scores 11/01/2019 08/22/2019 06/10/2019 08/21/2018  PHQ - 2 Score 0 0 0 0  PHQ- 9 Score - - - -     Cognitive Function     6CIT Screen 11/01/2019  What Year? 0 points  What month? 0 points  What time? 0 points  Count back from 20 0 points  Months in reverse 0 points  Repeat phrase 0 points  Total Score 0    Immunization History  Administered Date(s) Administered  . Influenza, Seasonal, Injecte, Preservative Fre 07/17/2012  . PFIZER SARS-COV-2 Vaccination 07/26/2019, 08/20/2019  . Pneumococcal Conjugate-13 06/11/2013  . Pneumococcal Polysaccharide-23 08/17/2017  . Zoster 07/24/2014    Qualifies for Shingles Vaccine? Yes  Screening Tests Health Maintenance  Topic Date Due  . INFLUENZA VACCINE  01/19/2020  . TETANUS/TDAP  08/16/2026  . COVID-19 Vaccine  Completed  . PNA vac Low Risk Adult  Completed   Cancer Screenings: Lung: Low Dose CT Chest recommended if Age 3-80 years, 30 pack-year currently smoking OR have quit w/in 15years. Patient does not qualify. Colorectal: not recommended due to age.    Plan:     Reviewed health maintenance screenings with patient today and relevant education, vaccines, and/or referrals were provided.    Continue doing brain stimulating activities (puzzles, reading, adult coloring books, staying active) to keep memory sharp.    Continue to eat heart healthy diet (full of fruits, vegetables, whole grains, lean protein, water--limit salt, fat, and sugar intake) and increase physical activity as tolerated.  I have personally reviewed and noted the following in the patient's chart:   . Medical and social history . Use of alcohol, tobacco or illicit drugs  . Current medications and supplements . Functional ability and status . Nutritional status . Physical activity . Advanced directives . List of other physicians . Hospitalizations, surgeries, and ER visits in previous 12 months . Vitals . Screenings to include cognitive, depression, and falls . Referrals and appointments  In addition, I have reviewed and discussed with patient  certain preventive protocols, quality metrics, and best practice recommendations. A written personalized care plan for preventive services as well as general preventive health recommendations were provided to patient.     Sheral Flow, LPN  [id number]  Nurse Health Advisor  Nurse Notes: There were no vitals filed for this  visit. There is no height or weight on file to calculate BMI.

## 2019-11-11 ENCOUNTER — Ambulatory Visit: Payer: Medicare Other | Admitting: Family

## 2019-11-11 ENCOUNTER — Other Ambulatory Visit: Payer: Self-pay | Admitting: Family

## 2019-11-11 ENCOUNTER — Telehealth: Payer: Self-pay

## 2019-11-11 DIAGNOSIS — G8929 Other chronic pain: Secondary | ICD-10-CM

## 2019-11-11 DIAGNOSIS — M545 Low back pain, unspecified: Secondary | ICD-10-CM

## 2019-11-15 ENCOUNTER — Encounter: Payer: Self-pay | Admitting: Internal Medicine

## 2019-12-07 NOTE — Telephone Encounter (Signed)
Completed.

## 2020-01-29 ENCOUNTER — Encounter: Payer: Self-pay | Admitting: Internal Medicine

## 2020-02-05 ENCOUNTER — Other Ambulatory Visit: Payer: Self-pay | Admitting: Internal Medicine

## 2020-02-05 ENCOUNTER — Ambulatory Visit: Payer: Medicare Other | Admitting: Internal Medicine

## 2020-02-05 ENCOUNTER — Encounter: Payer: Self-pay | Admitting: Internal Medicine

## 2020-02-05 ENCOUNTER — Ambulatory Visit: Payer: Medicare Other | Admitting: Family

## 2020-02-05 ENCOUNTER — Other Ambulatory Visit: Payer: Self-pay

## 2020-02-05 ENCOUNTER — Ambulatory Visit (INDEPENDENT_AMBULATORY_CARE_PROVIDER_SITE_OTHER): Payer: Medicare Other

## 2020-02-05 VITALS — BP 160/84 | HR 64 | Temp 97.6°F | Resp 16 | Ht 65.5 in | Wt 131.5 lb

## 2020-02-05 DIAGNOSIS — K5904 Chronic idiopathic constipation: Secondary | ICD-10-CM | POA: Insufficient documentation

## 2020-02-05 DIAGNOSIS — R10825 Periumbilic rebound abdominal tenderness: Secondary | ICD-10-CM

## 2020-02-05 DIAGNOSIS — N320 Bladder-neck obstruction: Secondary | ICD-10-CM | POA: Diagnosis not present

## 2020-02-05 DIAGNOSIS — R350 Frequency of micturition: Secondary | ICD-10-CM | POA: Diagnosis not present

## 2020-02-05 DIAGNOSIS — N410 Acute prostatitis: Secondary | ICD-10-CM

## 2020-02-05 MED ORDER — ALFUZOSIN HCL ER 10 MG PO TB24: 10.0000 mg | ORAL_TABLET | Freq: Every day | ORAL | 1 refills | Status: DC

## 2020-02-05 MED ORDER — TRULANCE 3 MG PO TABS: 1.0000 | ORAL_TABLET | Freq: Every day | ORAL | 1 refills | Status: DC

## 2020-02-05 NOTE — Progress Notes (Signed)
Subjective:  Patient ID: Jason Barton, male    DOB: [birth year]  Age: 86 y.o. MRN: [id number]  CC: Abdominal Pain  This visit occurred during the SARS-CoV-2 public health emergency.  Safety protocols were in place, including screening questions prior to the visit, additional usage of staff PPE, and extensive cleaning of exam room while observing appropriate contact time as indicated for disinfecting solutions.    HPI LORAIN KEAST presents for f/up - He complains of a 3-week history of dysuria and decreased urinary output.  He flank pain and hematuria.  He also denies nausea, vomiting, fever, chills.  He has had difficulty having a bowel movement and does not thinks he has had a bowel movement in 6 days.  He has been using Dulcolax, fleets enemas, and castor oil.  He has maintained a good appetite.  Outpatient Medications Prior to Visit  Medication Sig Dispense Refill  . Ascorbic Acid (VITAMIN C WITH ROSE HIPS) 500 MG tablet Take 500 mg by mouth daily.    . Cholecalciferol (VITAMIN D PO) Take 5,000 Units by mouth every morning.    Marland Kitchen levothyroxine (SYNTHROID) 50 MCG tablet TAKE 1 TABLET BY MOUTH DAILY BEFORE BREAKFAST 90 tablet 3  . oxyCODONE (OXY IR/ROXICODONE) 5 MG immediate release tablet Take 1 tablet (5 mg total) by mouth every 6 (six) hours as needed for severe pain. 35 tablet 0  . selenium 50 MCG TABS tablet Take 50 mcg by mouth daily.    Marland Kitchen Specialty Vitamins Products (PROSTATE PO) Take by mouth daily.      . Turmeric (QC TUMERIC COMPLEX PO) Take by mouth.    . Zinc 50 MG TABS Take by mouth.     No facility-administered medications prior to visit.    ROS Review of Systems  Constitutional: Negative for appetite change, chills, diaphoresis, fatigue and fever.  HENT: Negative.   Eyes: Negative.   Respiratory: Negative for cough, chest tightness, shortness of breath and wheezing.   Cardiovascular: Negative for chest pain, palpitations and leg swelling.  Gastrointestinal:  Positive for constipation. Negative for abdominal pain, blood in stool, diarrhea and nausea.  Genitourinary: Positive for difficulty urinating and dysuria. Negative for flank pain, frequency, hematuria, testicular pain and urgency.  Musculoskeletal: Negative.  Negative for arthralgias and myalgias.  Skin: Negative.   Neurological: Negative.  Negative for dizziness, weakness and light-headedness.  Hematological: Negative.   Psychiatric/Behavioral: Negative.     Objective:  BP (!) 160/84 (BP Location: Left Arm, Patient Position: Sitting, Cuff Size: Normal)   Pulse 64   Temp 97.6 F (36.4 C) (Oral)   Resp 16   Ht 5' 5.5" (1.664 m)   Wt 131 lb 8 oz (59.6 kg)   SpO2 96%   BMI 21.55 kg/m   BP Readings from Last 3 Encounters:  02/05/20 (!) 160/84  09/26/19 (!) 162/88  08/22/19 130/76    Wt Readings from Last 3 Encounters:  02/05/20 131 lb 8 oz (59.6 kg)  09/26/19 135 lb 8 oz (61.5 kg)  08/22/19 135 lb (61.2 kg)    Physical Exam Vitals reviewed. Exam conducted with a chaperone present (his daughter).  HENT:     Nose: Nose normal.     Mouth/Throat:     Mouth: Mucous membranes are moist.  Eyes:     General: No scleral icterus.    Conjunctiva/sclera: Conjunctivae normal.  Cardiovascular:     Rate and Rhythm: Normal rate and regular rhythm.     Heart sounds: No murmur heard.  Pulmonary:     Breath sounds: No stridor. No wheezing, rhonchi or rales.  Abdominal:     General: Abdomen is protuberant. Bowel sounds are decreased. There is distension.     Palpations: Abdomen is soft. There is mass. There is no hepatomegaly or splenomegaly.     Tenderness: There is abdominal tenderness in the suprapubic area. There is no guarding or rebound.     Hernia: There is no hernia in the left inguinal area or right inguinal area.    Genitourinary:    Pubic Area: No rash.      Penis: Normal. No discharge, swelling or lesions.      Testes: Normal.     Epididymis:     Right: Normal.      Left: Normal.     Prostate: Enlarged and tender. No nodules present.     Rectum: Normal. Guaiac result negative. No mass, tenderness, anal fissure, external hemorrhoid or internal hemorrhoid. Normal anal tone.     Comments: Stool is oozing out of the rectum Musculoskeletal:     Cervical back: Neck supple.     Right lower leg: No edema.     Left lower leg: No edema.  Lymphadenopathy:     Cervical: No cervical adenopathy.     Lower Body: No right inguinal adenopathy. No left inguinal adenopathy.  Skin:    General: Skin is warm and dry.  Neurological:     General: No focal deficit present.     Mental Status: He is alert.     Lab Results  Component Value Date   WBC 4.8 08/16/2019   HGB 14.7 08/16/2019   HCT 43.5 08/16/2019   PLT 145.0 (L) 08/16/2019   GLUCOSE 91 08/16/2019   CHOL 171 08/16/2019   TRIG 58.0 08/16/2019   HDL 66.50 08/16/2019   LDLCALC 93 08/16/2019   ALT 8 08/16/2019   AST 13 08/16/2019   NA 142 08/16/2019   K 3.5 08/16/2019   CL 106 08/16/2019   CREATININE 0.91 08/16/2019   BUN 14 08/16/2019   CO2 29 08/16/2019   TSH 4.12 08/16/2019   HGBA1C 5.1 08/16/2019    DG Chest 2 View  Result Date: 01/17/2018 CLINICAL DATA:  Follow-up abnormal chest x-ray EXAM: CHEST - 2 VIEW COMPARISON:  12/18/2017, 07/28/2015 FINDINGS: Hyperinflation with emphysematous disease. The previously noted left upper lobe opacity is not clearly visible on the current study. Biapical pleural and parenchymal scarring. Stable cardiomediastinal silhouette with atherosclerosis. No pneumothorax. Scoliosis with multiple thoracic compression deformities as before. IMPRESSION: 1. Previously noted left upper lobe opacity is not clearly identified on the current study 2. Hyperinflation with emphysematous disease. Electronically Signed   By: Donavan Foil M.D.   On: 01/17/2018 14:50   DG ABD ACUTE 2+V W 1V CHEST  Result Date: 02/05/2020 CLINICAL DATA:  Tenderness to palpation in abdomen question mass  upper bladder EXAM: DG ABDOMEN ACUTE W/ 1V CHEST COMPARISON:  09/06/2007 FINDINGS: Density in the pelvis likely mildly distended bladder. Scattered increased stool throughout colon with gas at splenic flexure. Nonobstructive bowel gas pattern without dilatation or wall thickening. Bones demineralized. IMPRESSION: Increased stool throughout colon. Mild distended urinary bladder. Electronically Signed   By: Lavonia Dana M.D.   On: 02/05/2020 14:34    Assessment & Plan:   Mahamadou was seen today for abdominal pain.  Diagnoses and all orders for this visit:  Bladder outlet obstruction- His symptoms, exam, and urinalysis are concerning for acute prostatitis. I recommended that he start taking  a course of Bactrim DS.  Will also start a peripheral alpha-blocker to help him empty his bladder.  Additionally, I have recommended that he undergo a CT scan of the abdomen/pelvis to see if there is an obstruction or mass. -     alfuzosin (UROXATRAL) 10 MG 24 hr tablet; Take 1 tablet (10 mg total) by mouth daily with breakfast. -     Urinalysis, Routine w reflex microscopic; Future -     Urinalysis, Routine w reflex microscopic  Periumbilical abdominal tenderness with rebound tenderness- See above. -     DG ABD ACUTE 2+V W 1V CHEST; Future -     CT Abdomen Pelvis W Contrast; Future -     Urinalysis with Culture Reflex; Future -     Cancel: Urinalysis with Culture Reflex  Chronic idiopathic constipation -     Discontinue: Plecanatide (TRULANCE) 3 MG TABS; Take 1 tablet by mouth daily.  Frequency of urination -     Urinalysis with Culture Reflex; Future -     Cancel: Urinalysis with Culture Reflex -     Urinalysis, Routine w reflex microscopic; Future -     Urinalysis, Routine w reflex microscopic  Acute prostatitis without hematuria -     sulfamethoxazole-trimethoprim (BACTRIM DS) 800-160 MG tablet; Take 1 tablet by mouth 2 (two) times daily.  Other orders -     MICROSCOPIC MESSAGE   I am having Alyson Locket. Stantz start on alfuzosin and sulfamethoxazole-trimethoprim. I am also having him maintain his Specialty Vitamins Products (PROSTATE PO), Cholecalciferol (VITAMIN D PO), levothyroxine, Zinc, Turmeric (QC TUMERIC COMPLEX PO), vitamin C with rose hips, selenium, and oxyCODONE.  Meds ordered this encounter  Medications  . alfuzosin (UROXATRAL) 10 MG 24 hr tablet    Sig: Take 1 tablet (10 mg total) by mouth daily with breakfast.    Dispense:  90 tablet    Refill:  1  . DISCONTD: Plecanatide (TRULANCE) 3 MG TABS    Sig: Take 1 tablet by mouth daily.    Dispense:  90 tablet    Refill:  1  . sulfamethoxazole-trimethoprim (BACTRIM DS) 800-160 MG tablet    Sig: Take 1 tablet by mouth 2 (two) times daily.    Dispense:  60 tablet    Refill:  0    I spent 50 minutes in preparing to see the patient by review of recent labs, imaging and procedures, obtaining and reviewing separately obtained history, communicating with the patient and family or caregiver, ordering medications, tests or procedures, and documenting clinical information in the EHR including the differential Dx, treatment, and any further evaluation and other management of 1. Periumbilical abdominal tenderness with rebound tenderness 2. Bladder outlet obstruction 3. Chronic idiopathic constipation 4. Frequency of urination 5. Acute prostatitis without hematuria     Follow-up: Return in about 3 weeks (around 02/26/2020).  Scarlette Calico, MD

## 2020-02-05 NOTE — Patient Instructions (Signed)

## 2020-02-06 ENCOUNTER — Telehealth: Payer: Self-pay | Admitting: Internal Medicine

## 2020-02-06 DIAGNOSIS — R350 Frequency of micturition: Secondary | ICD-10-CM | POA: Insufficient documentation

## 2020-02-06 DIAGNOSIS — N410 Acute prostatitis: Secondary | ICD-10-CM | POA: Insufficient documentation

## 2020-02-06 LAB — MICROSCOPIC MESSAGE

## 2020-02-06 LAB — URINALYSIS, ROUTINE W REFLEX MICROSCOPIC
Bacteria, UA: NONE SEEN /HPF
Bilirubin Urine: NEGATIVE
Glucose, UA: NEGATIVE
Hyaline Cast: NONE SEEN /LPF
Nitrite: NEGATIVE
Specific Gravity, Urine: 1.02 (ref 1.001–1.03)
pH: 6 (ref 5.0–8.0)

## 2020-02-06 MED ORDER — SULFAMETHOXAZOLE-TRIMETHOPRIM 800-160 MG PO TABS: 1.0000 | ORAL_TABLET | Freq: Two times a day (BID) | ORAL | 0 refills | Status: AC

## 2020-02-06 NOTE — Telephone Encounter (Signed)
Can the patient take an additional dose of the Amitiza? Patient still has not had a bowel movement.

## 2020-02-06 NOTE — Telephone Encounter (Signed)
   Patient calling for advice, patient states lubiprostone (AMITIZA) 8 MCG capsule was started on 8/18by Dr Ronnald Ramp, still no bowel movement. Patient wants to know if he should take additional medication

## 2020-02-07 ENCOUNTER — Encounter: Payer: Self-pay | Admitting: Internal Medicine

## 2020-02-07 ENCOUNTER — Other Ambulatory Visit: Payer: Self-pay

## 2020-02-07 ENCOUNTER — Telehealth: Payer: Self-pay

## 2020-02-07 ENCOUNTER — Emergency Department (HOSPITAL_BASED_OUTPATIENT_CLINIC_OR_DEPARTMENT_OTHER)
Admission: EM | Admit: 2020-02-07 | Discharge: 2020-02-07 | Disposition: A | Discharge status: Home / Self Care | Payer: Medicare Other | Attending: Emergency Medicine | Admitting: Emergency Medicine

## 2020-02-07 ENCOUNTER — Encounter (HOSPITAL_BASED_OUTPATIENT_CLINIC_OR_DEPARTMENT_OTHER): Payer: Self-pay | Admitting: *Deleted

## 2020-02-07 ENCOUNTER — Ambulatory Visit
Admission: RE | Admit: 2020-02-07 | Discharge: 2020-02-07 | Disposition: A | Discharge status: Home / Self Care | Payer: Medicare Other | Source: Ambulatory Visit | Attending: Internal Medicine | Admitting: Internal Medicine

## 2020-02-07 DIAGNOSIS — E039 Hypothyroidism, unspecified: Secondary | ICD-10-CM | POA: Diagnosis not present

## 2020-02-07 DIAGNOSIS — J449 Chronic obstructive pulmonary disease, unspecified: Secondary | ICD-10-CM | POA: Insufficient documentation

## 2020-02-07 DIAGNOSIS — Z79890 Hormone replacement therapy (postmenopausal): Secondary | ICD-10-CM | POA: Insufficient documentation

## 2020-02-07 DIAGNOSIS — R339 Retention of urine, unspecified: Secondary | ICD-10-CM | POA: Diagnosis not present

## 2020-02-07 DIAGNOSIS — E871 Hypo-osmolality and hyponatremia: Secondary | ICD-10-CM | POA: Insufficient documentation

## 2020-02-07 DIAGNOSIS — R10825 Periumbilic rebound abdominal tenderness: Secondary | ICD-10-CM

## 2020-02-07 DIAGNOSIS — Z79899 Other long term (current) drug therapy: Secondary | ICD-10-CM | POA: Insufficient documentation

## 2020-02-07 DIAGNOSIS — K5900 Constipation, unspecified: Secondary | ICD-10-CM | POA: Diagnosis not present

## 2020-02-07 LAB — URINALYSIS, ROUTINE W REFLEX MICROSCOPIC
Bilirubin Urine: NEGATIVE
Glucose, UA: NEGATIVE mg/dL
Ketones, ur: NEGATIVE mg/dL
Leukocytes,Ua: NEGATIVE
Nitrite: NEGATIVE
Protein, ur: NEGATIVE mg/dL
Specific Gravity, Urine: 1.015 (ref 1.005–1.030)
pH: 6 (ref 5.0–8.0)

## 2020-02-07 LAB — CBC WITH DIFFERENTIAL/PLATELET
Abs Immature Granulocytes: 0.06 10*3/uL (ref 0.00–0.07)
Basophils Absolute: 0 10*3/uL (ref 0.0–0.1)
Basophils Relative: 0 %
Eosinophils Absolute: 0 10*3/uL (ref 0.0–0.5)
Eosinophils Relative: 0 %
HCT: 37 % — ABNORMAL LOW (ref 39.0–52.0)
Hemoglobin: 12.5 g/dL — ABNORMAL LOW (ref 13.0–17.0)
Immature Granulocytes: 1 %
Lymphocytes Relative: 8 %
Lymphs Abs: 0.8 10*3/uL (ref 0.7–4.0)
MCH: 30.1 pg (ref 26.0–34.0)
MCHC: 33.8 g/dL (ref 30.0–36.0)
MCV: 89.2 fL (ref 80.0–100.0)
Monocytes Absolute: 1.4 10*3/uL — ABNORMAL HIGH (ref 0.1–1.0)
Monocytes Relative: 14 %
Neutro Abs: 7.7 10*3/uL (ref 1.7–7.7)
Neutrophils Relative %: 77 %
Platelets: 129 10*3/uL — ABNORMAL LOW (ref 150–400)
RBC: 4.15 MIL/uL — ABNORMAL LOW (ref 4.22–5.81)
RDW: 13.8 % (ref 11.5–15.5)
WBC: 10 10*3/uL (ref 4.0–10.5)
nRBC: 0 % (ref 0.0–0.2)

## 2020-02-07 LAB — COMPREHENSIVE METABOLIC PANEL
ALT: 18 U/L (ref 0–44)
AST: 20 U/L (ref 15–41)
Albumin: 3.5 g/dL (ref 3.5–5.0)
Alkaline Phosphatase: 56 U/L (ref 38–126)
Anion gap: 9 (ref 5–15)
BUN: 16 mg/dL (ref 8–23)
CO2: 25 mmol/L (ref 22–32)
Calcium: 8.4 mg/dL — ABNORMAL LOW (ref 8.9–10.3)
Chloride: 96 mmol/L — ABNORMAL LOW (ref 98–111)
Creatinine, Ser: 0.89 mg/dL (ref 0.61–1.24)
GFR calc Af Amer: >60 mL/min (ref >60)
GFR calc non Af Amer: >60 mL/min (ref >60)
Glucose, Bld: 110 mg/dL — ABNORMAL HIGH (ref 70–99)
Potassium: 3.4 mmol/L — ABNORMAL LOW (ref 3.5–5.1)
Sodium: 130 mmol/L — ABNORMAL LOW (ref 135–145)
Total Bilirubin: 0.8 mg/dL (ref 0.3–1.2)
Total Protein: 5.9 g/dL — ABNORMAL LOW (ref 6.5–8.1)

## 2020-02-07 LAB — URINALYSIS, MICROSCOPIC (REFLEX)

## 2020-02-07 LAB — LIPASE, BLOOD: Lipase: 20 U/L (ref 11–51)

## 2020-02-07 MED ORDER — MINERAL OIL RE ENEM: 1.0000 | ENEMA | Freq: Once | RECTAL | Status: DC

## 2020-02-07 MED ORDER — IOPAMIDOL (ISOVUE-300) INJECTION 61%
100.0000 mL | Freq: Once | INTRAVENOUS | Status: AC | PRN
  Administered 2020-02-07: 100 mL via INTRAVENOUS

## 2020-02-07 NOTE — ED Notes (Signed)
Pt discharged by Rockne Coons, RN

## 2020-02-07 NOTE — Telephone Encounter (Signed)
Called dtr and spoke to her. Documented in previous note.

## 2020-02-07 NOTE — ED Notes (Signed)
Per MD, inserted Foley catheter 64F

## 2020-02-07 NOTE — Discharge Instructions (Signed)
Recommend taking the bowel regimen as discussed.  Regarding her urinary retention, please schedule follow-up with urology.  For your retention as well as your constipation and hyponatremia, please schedule follow-up appointment with your primary doctor.  Ideally your sodium level should be rechecked sometime within the next few days.  If you develop worsening abdominal pain, vomiting or other new concerning symptom, please return to ER for reassessment.  Continue Foley until this is discontinued by either your primary doctor or urology.

## 2020-02-07 NOTE — ED Notes (Signed)
Greater than 719ml from bladder scan

## 2020-02-07 NOTE — ED Triage Notes (Signed)
No BM for 8 days. Urinary retention. He was seen by his MD on Wednesday for abdominal pain.

## 2020-02-07 NOTE — Telephone Encounter (Signed)
[facility] and she stated that pt has taken 3 doses of the Amitiza yesterday and has taken 2 doses of the Amitiza today.   Concerned that there may a more emergent situation arising - I advise pt dtr to take pt to the emergency room to evaluated.   Pt dtr agreed and will take pt to [facility].

## 2020-02-07 NOTE — Telephone Encounter (Signed)
New message    The daughter Jason Barton calling regarding her father unable to not have a bowel movement seen on  8.18.21.   The daughter voiced medication is not working   Please advise

## 2020-02-07 NOTE — ED Notes (Signed)
Soap suds enema given per provider request.  Large amount of soft brown stool out.  Tolerated disimpaction well.  Discharge instructions reviewed.  Education provided on use of catheter.  Family at the bedside.

## 2020-02-07 NOTE — ED Provider Notes (Signed)
[place] [facility] EMERGENCY DEPARTMENT Provider Note   CSN: [id number] Arrival date & time: 02/07/20  1628     History Chief Complaint  Patient presents with  . Urinary Retention  . Constipation    Jason Barton is a 86 y.o. male. Presents today with concern for urinary retention, constipation. Patient reports his last bowel movement was approximately 8 days ago, passes gas, no nausea or vomiting. Has had some intermittent abdominal discomfort but not currently having abdominal pain. He also over this timeframe has been having pain with urination, hesitancy, difficulty urinating. Primary doctor saw patient earlier this week, started him on Amitiza for constipation, Uroxatral for help emptying bladder as well as Bactrim for suspected acute prostatitis. Patient reports he now is unable to urinate at all.   CT performed today:  IMPRESSION: Increased stool throughout colon with increased stool in rectum associated with mild rectal wall thickening question stercoral colitis. Moderate-sized hiatal hernia with low-attenuation wall thickening question edema/gastritis. Small LEFT supraumbilical ventral hernia containing a nonobstructed small bowel loop. Small LEFT inguinal hernia containing fat. Prostatic enlargement. Aortic Atherosclerosis (ICD10-I70.0) and Emphysema (ICD10-J43.9).  HPI     Past Medical History:  Diagnosis Date  . COPD (chronic obstructive pulmonary disease) ([place]) 05/21/2011  . Diverticulosis of colon 05/21/2011  . Hypothyroidism   . Impaired glucose tolerance 06/08/2012  . Increased prostate specific antigen (PSA) velocity 05/24/2011  . Nonmelanoma skin cancer 05/21/2011  . S/P appendectomy 05/21/2011  . S/P inguinal hernia repair 05/21/2011  . S/p small bowel obstruction 05/21/2011  . Thoracic scoliosis 05/21/2011    Patient Active Problem List   Diagnosis Date Noted  . Frequency of urination 02/06/2020  . Acute prostatitis without hematuria  02/06/2020  . Periumbilical abdominal tenderness with rebound tenderness 02/05/2020  . Bladder outlet obstruction 02/05/2020  . Chronic idiopathic constipation 02/05/2020  . Left-sided chest wall pain 09/26/2019  . Fracture of one rib, left side, initial encounter for closed fracture 09/26/2019  . Left low back pain 06/23/2019  . Rash 12/20/2017  . CAP (community acquired pneumonia) 12/20/2017  . Melanoma ([place]) 08/17/2017  . Hypothyroidism 08/16/2016  . Mass of left side of neck 07/28/2015  . Abnormal TSH 07/23/2014  . Chest pain 10/01/2013  . Impaired glucose tolerance 06/08/2012  . Erectile dysfunction 11/27/2011  . Hyperlipidemia 05/24/2011  . Increased prostate specific antigen (PSA) velocity 05/24/2011  . Weight loss 05/24/2011  . Preventative health care 05/21/2011  . Nonmelanoma skin cancer 05/21/2011  . S/P inguinal hernia repair 05/21/2011  . S/P appendectomy 05/21/2011  . S/p small bowel obstruction 05/21/2011  . Diverticulosis of colon 05/21/2011  . COPD (chronic obstructive pulmonary disease) ([place]) 05/21/2011  . Thoracic scoliosis 05/21/2011    Past Surgical History:  Procedure Laterality Date  . APPENDECTOMY  1939  . COLON RESECTION     2009 for a "kink in colon" adhesions after appendectomy  . SHOULDER SURGERY     dr sypher; rotater cuff  . STRABISMUS SURGERY Right 11/21/2014   Procedure: REPAIR STRABISMUS RIGHT EYE ;  Surgeon: Everitt Amber, MD;  Location: [facility];  Service: Ophthalmology;  Laterality: Right;  . TONSILLECTOMY  1942       Family History  Problem Relation Age of Onset  . Arthritis Other   . Sudden death Other     Social History   Tobacco Use  . Smoking status: Never Smoker  . Smokeless tobacco: Never Used  Substance Use Topics  . Alcohol use: Yes  Comment:  Drinks 3 drinks a year  . Drug use: No    Home Medications Prior to Admission medications   Medication Sig Start Date End Date Taking? Authorizing  Provider  alfuzosin (UROXATRAL) 10 MG 24 hr tablet Take 1 tablet (10 mg total) by mouth daily with breakfast. 02/05/20   Janith Lima, MD  Ascorbic Acid (VITAMIN C WITH ROSE HIPS) 500 MG tablet Take 500 mg by mouth daily.    [provider]  Cholecalciferol (VITAMIN D PO) Take 5,000 Units by mouth every morning.    [provider]  levothyroxine (SYNTHROID) 50 MCG tablet TAKE 1 TABLET BY MOUTH DAILY BEFORE BREAKFAST 12/31/18   Biagio Borg, MD  lubiprostone (AMITIZA) 8 MCG capsule Take 1 capsule (8 mcg total) by mouth 2 (two) times daily with a meal. 02/05/20   Janith Lima, MD  oxyCODONE (OXY IR/ROXICODONE) 5 MG immediate release tablet Take 1 tablet (5 mg total) by mouth every 6 (six) hours as needed for severe pain. 09/26/19   Janith Lima, MD  selenium 50 MCG TABS tablet Take 50 mcg by mouth daily.    [provider]  Specialty Vitamins Products (PROSTATE PO) Take by mouth daily.      [provider]  sulfamethoxazole-trimethoprim (BACTRIM DS) 800-160 MG tablet Take 1 tablet by mouth 2 (two) times daily. 02/06/20 03/07/20  Janith Lima, MD  Turmeric (QC TUMERIC COMPLEX PO) Take by mouth.    [provider]  Zinc 50 MG TABS Take by mouth.    [provider]    Allergies    Augmentin [amoxicillin-pot clavulanate]  Review of Systems   Review of Systems  Constitutional: Negative for chills and fever.  HENT: Negative for ear pain and sore throat.   Eyes: Negative for pain and visual disturbance.  Respiratory: Negative for cough and shortness of breath.   Cardiovascular: Negative for chest pain and palpitations.  Gastrointestinal: Positive for abdominal pain, constipation and nausea. Negative for vomiting.  Genitourinary: Positive for decreased urine volume, dysuria, frequency and urgency. Negative for hematuria.  Musculoskeletal: Negative for arthralgias and back pain.  Skin: Negative for color change and rash.  Neurological:  Negative for seizures and syncope.  All other systems reviewed and are negative.   Physical Exam Updated Vital Signs BP (!) 171/98   Pulse 98   Temp 98.1 F (36.7 C) (Oral)   Resp 20   Ht 5' 5.5" (1.664 m)   Wt 59.6 kg   SpO2 99%   BMI 21.53 kg/m   Physical Exam Vitals and nursing note reviewed.  Constitutional:      Appearance: He is well-developed.  HENT:     Head: Normocephalic and atraumatic.  Eyes:     Conjunctiva/sclera: Conjunctivae normal.  Cardiovascular:     Rate and Rhythm: Normal rate and regular rhythm.     Heart sounds: No murmur heard.   Pulmonary:     Effort: Pulmonary effort is normal. No respiratory distress.     Breath sounds: Normal breath sounds.  Abdominal:     Palpations: Abdomen is soft.     Tenderness: There is no abdominal tenderness.  Musculoskeletal:        General: No swelling or tenderness.     Cervical back: Neck supple.  Skin:    General: Skin is warm and dry.  Neurological:     General: No focal deficit present.     Mental Status: He is alert and oriented to person,  place, and time.     ED Results / Procedures / Treatments   Labs (all labs ordered are listed, but only abnormal results are displayed) Labs Reviewed  URINALYSIS, ROUTINE W REFLEX MICROSCOPIC - Abnormal; Notable for the following components:      Result Value   Hgb urine dipstick TRACE (*)    All other components within normal limits  COMPREHENSIVE METABOLIC PANEL - Abnormal; Notable for the following components:   Sodium 130 (*)    Potassium 3.4 (*)    Chloride 96 (*)    Glucose, Bld 110 (*)    Calcium 8.4 (*)    Total Protein 5.9 (*)    All other components within normal limits  CBC WITH DIFFERENTIAL/PLATELET - Abnormal; Notable for the following components:   RBC 4.15 (*)    Hemoglobin 12.5 (*)    HCT 37.0 (*)    Platelets 129 (*)    Monocytes Absolute 1.4 (*)    All other components within normal limits  URINALYSIS, MICROSCOPIC (REFLEX) - Abnormal;  Notable for the following components:   Bacteria, UA RARE (*)    All other components within normal limits  LIPASE, BLOOD    EKG None  Radiology CT Abdomen Pelvis W Contrast  Result Date: 02/07/2020 CLINICAL DATA:  No bowel movement for 8 days, acute nonlocalized abdominal pain, decreased urine output, pelvic pressure, history of prior appendectomy, prior colon resection EXAM: CT ABDOMEN AND PELVIS WITH CONTRAST TECHNIQUE: Multidetector CT imaging of the abdomen and pelvis was performed using the standard protocol following bolus administration of intravenous contrast. Sagittal and coronal MPR images reconstructed from axial data set. Creatinine was obtained on site at [facility] at [address] Results: Creatinine 1.1 mg/dL.  Calculated GFR 58 mL/min CONTRAST:  133mL ISOVUE-300 IOPAMIDOL (ISOVUE-300) INJECTION 61% IV. Dilute oral contrast. COMPARISON:  None FINDINGS: Lower chest: Lung bases emphysematous but clear Hepatobiliary: Contracted gallbladder. Tiny hepatic cysts. Liver otherwise unremarkable. Pancreas: Normal appearance Spleen: Normal appearance Adrenals/Urinary Tract: Adrenal glands unremarkable. Small RIGHT renal cyst. Kidneys and ureters normal appearance. Small calcifications within wall of urinary bladder, nonspecific. Stomach/Bowel: Appendix surgically absent increased stool in rectum with mild rectal wall thickening question stercoral colitis. Increased stool throughout colon. Moderate-sized hiatal hernia with low-attenuation wall thickening question edema/gastritis. Small LEFT supraumbilical ventral hernia containing a nonobstructed small bowel loop. Remaining bowel loops unremarkable. Vascular/Lymphatic: Atherosclerotic calcifications aorta and iliac arteries without aneurysm. No adenopathy. Reproductive: Prostatic enlargement. Other: Small LEFT inguinal hernia containing fat. No free air or free fluid. Musculoskeletal: Osseous demineralization. Scattered mild  degenerative disc and facet disease changes. IMPRESSION: Increased stool throughout colon with increased stool in rectum associated with mild rectal wall thickening question stercoral colitis. Moderate-sized hiatal hernia with low-attenuation wall thickening question edema/gastritis. Small LEFT supraumbilical ventral hernia containing a nonobstructed small bowel loop. Small LEFT inguinal hernia containing fat. Prostatic enlargement. Aortic Atherosclerosis (ICD10-I70.0) and Emphysema (ICD10-J43.9). Electronically Signed   By: Lavonia Dana M.D.   On: 02/07/2020 13:14    Procedures Procedures (including critical care time)  Medications Ordered in ED Medications - No data to display  ED Course  I have reviewed the triage vital signs and the nursing notes.  Pertinent labs & imaging results that were available during my care of the patient were reviewed by me and considered in my medical decision making (see chart for details).    MDM Rules/Calculators/A&P  86 year old male presenting to ER with concern for urinary retention, constipation. Patient recently diagnosed with prostatitis and on Bactrim therapy. Significant urine on bladder scan, concern for urinary retention. Place Foley catheter and patient had significant urine output almost 1L. Provided soap suds enema and had large BM. Labs today were grossly normal though noted mild hyponatremia. Recommend patient have follow-up with urology and primary doctor for these concerns, follow-up for urinary retention, hyponatremia, voiding trial and Foley removal.   On reassessment, patient remains well-appearing with stable vital signs. Believe he is appropriate for discharge and outpatient management at this time.    After the discussed management above, the patient was determined to be safe for discharge.  The patient was in agreement with this plan and all questions regarding their care were answered.  ED return precautions  were discussed and the patient will return to the ED with any significant worsening of condition.  Final Clinical Impression(s) / ED Diagnoses Final diagnoses:  Urinary retention  Constipation, unspecified constipation type  Hyponatremia    Rx / DC Orders ED Discharge Orders    None       Lucrezia Starch, MD 02/07/20 2056

## 2020-02-11 ENCOUNTER — Ambulatory Visit (INDEPENDENT_AMBULATORY_CARE_PROVIDER_SITE_OTHER): Payer: Medicare Other | Admitting: Internal Medicine

## 2020-02-11 ENCOUNTER — Other Ambulatory Visit: Payer: Self-pay

## 2020-02-11 ENCOUNTER — Encounter: Payer: Self-pay | Admitting: Internal Medicine

## 2020-02-11 ENCOUNTER — Telehealth: Payer: Self-pay

## 2020-02-11 VITALS — BP 140/90 | HR 69 | Temp 97.9°F | Resp 16 | Ht 65.5 in | Wt 132.5 lb

## 2020-02-11 DIAGNOSIS — K5901 Slow transit constipation: Secondary | ICD-10-CM

## 2020-02-11 DIAGNOSIS — D539 Nutritional anemia, unspecified: Secondary | ICD-10-CM | POA: Diagnosis not present

## 2020-02-11 DIAGNOSIS — E871 Hypo-osmolality and hyponatremia: Secondary | ICD-10-CM | POA: Diagnosis not present

## 2020-02-11 DIAGNOSIS — N320 Bladder-neck obstruction: Secondary | ICD-10-CM

## 2020-02-11 DIAGNOSIS — N410 Acute prostatitis: Secondary | ICD-10-CM

## 2020-02-11 DIAGNOSIS — K5900 Constipation, unspecified: Secondary | ICD-10-CM | POA: Insufficient documentation

## 2020-02-11 NOTE — Progress Notes (Signed)
Subjective:  Patient ID: Jason Barton, male    DOB: 1926/05/20  Age: 86 y.o. MRN: [id number]  CC: Anemia  This visit occurred during the SARS-CoV-2 public health emergency.  Safety protocols were in place, including screening questions prior to the visit, additional usage of staff PPE, and extensive cleaning of exam room while observing appropriate contact time as indicated for disinfecting solutions.    HPI Jason Barton presents for f/up - I saw him recently for constipation and urinary retention.  His urine was positive for white cells so I treated with Bactrim DS.  His plain films showed quite a bit of stool retention.  About 5 days ago he ended up going to the emergency department and had an enema and says he is feeling much better and is having bowel movements.  He ended up getting a Foley catheter placed.  He comes in today to have the Foley catheter removed.  In the ED he was found to have a low sodium level.  Outpatient Medications Prior to Visit  Medication Sig Dispense Refill  . alfuzosin (UROXATRAL) 10 MG 24 hr tablet Take 1 tablet (10 mg total) by mouth daily with breakfast. 90 tablet 1  . Ascorbic Acid (VITAMIN C WITH ROSE HIPS) 500 MG tablet Take 500 mg by mouth daily.    . Cholecalciferol (VITAMIN D PO) Take 5,000 Units by mouth every morning.    Marland Kitchen levothyroxine (SYNTHROID) 50 MCG tablet TAKE 1 TABLET BY MOUTH DAILY BEFORE BREAKFAST 90 tablet 3  . lubiprostone (AMITIZA) 8 MCG capsule Take 1 capsule (8 mcg total) by mouth 2 (two) times daily with a meal. 180 capsule 1  . selenium 50 MCG TABS tablet Take 50 mcg by mouth daily.    Marland Kitchen Specialty Vitamins Products (PROSTATE PO) Take by mouth daily.      Marland Kitchen sulfamethoxazole-trimethoprim (BACTRIM DS) 800-160 MG tablet Take 1 tablet by mouth 2 (two) times daily. 60 tablet 0  . Turmeric (QC TUMERIC COMPLEX PO) Take by mouth.    . Zinc 50 MG TABS Take by mouth.    . oxyCODONE (OXY IR/ROXICODONE) 5 MG immediate release tablet  Take 1 tablet (5 mg total) by mouth every 6 (six) hours as needed for severe pain. (Patient not taking: Reported on 02/11/2020) 35 tablet 0   No facility-administered medications prior to visit.    ROS Review of Systems  Constitutional: Negative for appetite change, diaphoresis, fatigue and unexpected weight change.  HENT: Negative.   Eyes: Negative for visual disturbance.  Respiratory: Negative for cough, chest tightness, shortness of breath and wheezing.   Cardiovascular: Negative for chest pain, palpitations and leg swelling.  Gastrointestinal: Positive for abdominal distention and constipation. Negative for abdominal pain, blood in stool, diarrhea, nausea and vomiting.  Endocrine: Negative.   Genitourinary: Positive for difficulty urinating. Negative for dysuria, flank pain, frequency, hematuria and urgency.  Musculoskeletal: Negative for arthralgias, myalgias and neck pain.  Skin: Negative.  Negative for color change and wound.  Neurological: Negative for dizziness, weakness, light-headedness and numbness.  Hematological: Negative for adenopathy. Does not bruise/bleed easily.  Psychiatric/Behavioral: Positive for confusion and decreased concentration. Negative for dysphoric mood and sleep disturbance. The patient is not nervous/anxious.     Objective:  BP 140/90 (BP Location: Left Arm, Patient Position: Sitting, Cuff Size: Normal)   Pulse 69   Temp 97.9 F (36.6 C) (Oral)   Resp 16   Ht 5' 5.5" (1.664 m)   Wt 132 lb 8 oz (60.1  kg)   SpO2 97%   BMI 21.71 kg/m   BP Readings from Last 3 Encounters:  02/11/20 140/90  02/07/20 (!) 171/98  02/05/20 (!) 160/84    Wt Readings from Last 3 Encounters:  02/11/20 132 lb 8 oz (60.1 kg)  02/07/20 131 lb 6.3 oz (59.6 kg)  02/05/20 131 lb 8 oz (59.6 kg)    Physical Exam Vitals reviewed. Exam conducted with a chaperone present (Stefannie).  Constitutional:      Appearance: Normal appearance.  HENT:     Nose: Nose normal.      Mouth/Throat:     Mouth: Mucous membranes are moist.  Eyes:     General: No scleral icterus.    Conjunctiva/sclera: Conjunctivae normal.  Cardiovascular:     Rate and Rhythm: Normal rate and regular rhythm.     Heart sounds: No murmur heard.   Pulmonary:     Effort: Pulmonary effort is normal.     Breath sounds: No stridor. No wheezing, rhonchi or rales.  Abdominal:     General: Abdomen is protuberant. Bowel sounds are normal. There is distension.     Palpations: Abdomen is soft. There is no hepatomegaly, splenomegaly or mass.     Tenderness: There is no abdominal tenderness.  Genitourinary:    Pubic Area: No rash.      Penis: Normal and circumcised. No discharge, swelling or lesions.      Testes: Normal.     Comments: Foley catheter easily removed with no complications. Musculoskeletal:        General: Normal range of motion.     Cervical back: Neck supple.     Right lower leg: No edema.     Left lower leg: No edema.     Comments: IV catheter removed from the right antecubital fossa.  The site looks good with no evidence of infection.  No redness, swelling, or tenderness.  Lymphadenopathy:     Cervical: No cervical adenopathy.  Skin:    General: Skin is warm and dry.     Coloration: Skin is not pale.  Neurological:     General: No focal deficit present.     Mental Status: He is alert and oriented to person, place, and time. Mental status is at baseline.     Lab Results  Component Value Date   WBC 10.0 02/07/2020   HGB 12.5 (L) 02/07/2020   HCT 37.0 (L) 02/07/2020   PLT 129 (L) 02/07/2020   GLUCOSE 110 (H) 02/07/2020   CHOL 171 08/16/2019   TRIG 58.0 08/16/2019   HDL 66.50 08/16/2019   LDLCALC 93 08/16/2019   ALT 18 02/07/2020   AST 20 02/07/2020   NA 130 (L) 02/07/2020   K 3.4 (L) 02/07/2020   CL 96 (L) 02/07/2020   CREATININE 0.89 02/07/2020   BUN 16 02/07/2020   CO2 25 02/07/2020   TSH 4.12 08/16/2019   HGBA1C 5.1 08/16/2019    CT Abdomen Pelvis W  Contrast  Result Date: 02/07/2020 CLINICAL DATA:  No bowel movement for 8 days, acute nonlocalized abdominal pain, decreased urine output, pelvic pressure, history of prior appendectomy, prior colon resection EXAM: CT ABDOMEN AND PELVIS WITH CONTRAST TECHNIQUE: Multidetector CT imaging of the abdomen and pelvis was performed using the standard protocol following bolus administration of intravenous contrast. Sagittal and coronal MPR images reconstructed from axial data set. Creatinine was obtained on site at [facility] at [address] Results: Creatinine 1.1 mg/dL.  Calculated GFR 58 mL/min CONTRAST:  180mL ISOVUE-300 IOPAMIDOL (ISOVUE-300) INJECTION 61% IV. Dilute oral contrast. COMPARISON:  None FINDINGS: Lower chest: Lung bases emphysematous but clear Hepatobiliary: Contracted gallbladder. Tiny hepatic cysts. Liver otherwise unremarkable. Pancreas: Normal appearance Spleen: Normal appearance Adrenals/Urinary Tract: Adrenal glands unremarkable. Small RIGHT renal cyst. Kidneys and ureters normal appearance. Small calcifications within wall of urinary bladder, nonspecific. Stomach/Bowel: Appendix surgically absent increased stool in rectum with mild rectal wall thickening question stercoral colitis. Increased stool throughout colon. Moderate-sized hiatal hernia with low-attenuation wall thickening question edema/gastritis. Small LEFT supraumbilical ventral hernia containing a nonobstructed small bowel loop. Remaining bowel loops unremarkable. Vascular/Lymphatic: Atherosclerotic calcifications aorta and iliac arteries without aneurysm. No adenopathy. Reproductive: Prostatic enlargement. Other: Small LEFT inguinal hernia containing fat. No free air or free fluid. Musculoskeletal: Osseous demineralization. Scattered mild degenerative disc and facet disease changes. IMPRESSION: Increased stool throughout colon with increased stool in rectum associated with mild rectal wall thickening question stercoral  colitis. Moderate-sized hiatal hernia with low-attenuation wall thickening question edema/gastritis. Small LEFT supraumbilical ventral hernia containing a nonobstructed small bowel loop. Small LEFT inguinal hernia containing fat. Prostatic enlargement. Aortic Atherosclerosis (ICD10-I70.0) and Emphysema (ICD10-J43.9). Electronically Signed   By: Lavonia Dana M.D.   On: 02/07/2020 13:14    Assessment & Plan:   Khriz was seen today for anemia.  Diagnoses and all orders for this visit:  Deficiency anemia- I will screen him for vitamin deficiencies. -     Vitamin B12; Future -     CBC with Differential/Platelet; Future -     Folate; Future -     Ferritin; Future -     Vitamin B1; Future -     Iron; Future -     Reticulocytes; Future -     Reticulocytes -     Iron -     Vitamin B1 -     Ferritin -     Folate -     CBC with Differential/Platelet -     Vitamin B12  Acute hyponatremia- He is mildly symptomatic with this.  I will recheck his sodium level.  Will also check a random urinary sodium to see if this is SIADH. -     Sodium, urine, random; Future -     Sodium, urine, random  Slow transit constipation- I have asked him to see GI to consider treatment options for colonic motility disorders. -     TSH; Future -     Ambulatory referral to Gastroenterology -     TSH  Bladder outlet obstruction- Improvement noted.  Foley catheter removed at his request.  I recommended that he see urology to see if he needs to have a suprapubic catheter placed. -     Ambulatory referral to Urology  Acute prostatitis without hematuria- Will recheck his UA and urine culture to be certain that the infection has been adequately treated. -     Urinalysis, Routine w reflex microscopic; Future -     CULTURE, URINE COMPREHENSIVE; Future -     CULTURE, URINE COMPREHENSIVE -     Urinalysis, Routine w reflex microscopic   I have discontinued Alyson Locket. [place]endoll's oxyCODONE. I am also having him maintain his  Specialty Vitamins Products (PROSTATE PO), Cholecalciferol (VITAMIN D PO), levothyroxine, Zinc, Turmeric (QC TUMERIC COMPLEX PO), vitamin C with rose hips, selenium, alfuzosin, lubiprostone, and sulfamethoxazole-trimethoprim.  No orders of the defined types were placed in this encounter.  I spent 50 minutes in preparing to see the patient by review of recent labs, imaging and  procedures, obtaining and reviewing separately obtained history, communicating with the patient and family or caregiver, ordering medications, tests or procedures, and documenting clinical information in the EHR including the differential Dx, treatment, and any further evaluation and other management of 1. Deficiency anemia 2. Acute hyponatremia 3. Slow transit constipation 4. Bladder outlet obstruction 5. Acute prostatitis without hematuria     Follow-up: Return in about 3 weeks (around 03/03/2020).  Scarlette Calico, MD

## 2020-02-11 NOTE — Patient Instructions (Signed)
Hyponatremia Hyponatremia is when the amount of salt (sodium) in your blood is too low. When salt levels are low, your body may take in extra water. This can cause swelling throughout the body. The swelling often affects the brain. What are the causes? This condition may be caused by:  Certain medical problems or conditions.  Vomiting a lot.  Having watery poop (diarrhea) often.  Certain medicines or illegal drugs.  Not having enough water in the body (dehydration).  Drinking too much water.  Eating a diet that is low in salt.  Large burns on your body.  Too much sweating. What increases the risk? You are more likely to get this condition if you:  Have long-term (chronic) kidney disease.  Have heart failure.  Have a medical condition that causes you to have watery poop often.  Do very hard exercises.  Take medicines that affect the amount of salt is in your blood. What are the signs or symptoms? Symptoms of this condition include:  Headache.  Feeling like you may vomit (nausea).  Vomiting.  Being very tired (lethargic).  Muscle weakness and cramps.  Not wanting to eat as much as normal (loss of appetite).  Feeling weak or light-headed. Severe symptoms of this condition include:  Confusion.  Feeling restless (agitation).  Having a fast heart rate.  Passing out (fainting).  Seizures.  Coma. How is this treated? Treatment for this condition depends on the cause. Treatment may include:  Getting fluids through an IV tube that is put into one of your veins.  Taking medicines to fix the salt levels in your blood. If medicines are causing the problem, your medicines will need to be changed.  Limiting how much water or fluid you take in.  Monitoring in the hospital to watch your symptoms. Follow these instructions at home:   Take over-the-counter and prescription medicines only as told by your doctor. Many medicines can make this condition worse.  Talk with your doctor about any medicines that you are taking.  Eat and drink exactly as you are told by your doctor. ? Eat only the foods you are told to eat. ? Limit how much fluid you take.  Do not drink alcohol.  Keep all follow-up visits as told by your doctor. This is important. Contact a doctor if:  You feel more like you may vomit.  You feel more tired.  Your headache gets worse.  You feel more confused.  You feel weaker.  Your symptoms go away and then they come back.  You have trouble following the diet instructions. Get help right away if:  You have a seizure.  You pass out.  You keep having watery poop.  You keep vomiting. Summary  Hyponatremia is when the amount of salt in your blood is too low.  When salt levels are low, you can have swelling throughout the body. The swelling mostly affects the brain.  Treatment depends on the cause. Treatment may include getting IV fluids, medicines, or not drinking as much fluid. This information is not intended to replace advice given to you by your health care provider. Make sure you discuss any questions you have with your health care provider. Document Revised: 08/23/2018 Document Reviewed: 05/10/2018 Elsevier Patient Education  2020 Elsevier Inc.  

## 2020-02-11 NOTE — Telephone Encounter (Signed)
Pt contacted and he stated that he was able to urinate on his own. Pt voided 4 times. Pt stated that the first time had a very good flow and duration. Pt then stated that the other ones were not as well.   Pt will call us back and let us know how his night goes.

## 2020-02-11 NOTE — Telephone Encounter (Signed)
New message    The patient was seen today asking can the CMA call him back . No details was given

## 2020-02-12 ENCOUNTER — Encounter: Payer: Self-pay | Admitting: Internal Medicine

## 2020-02-12 NOTE — Telephone Encounter (Signed)
F/u    The patient calling back will explain in detail when the [place] calls back / have additional questions.

## 2020-02-13 NOTE — Telephone Encounter (Signed)
Called pt back. I have given him the number for the urologist. Pt stated that he has been waking up 8 or more times at night.   Pt stated that he has been able to have BM's.

## 2020-02-14 ENCOUNTER — Other Ambulatory Visit: Payer: Self-pay | Admitting: Internal Medicine

## 2020-02-14 DIAGNOSIS — E611 Iron deficiency: Secondary | ICD-10-CM | POA: Insufficient documentation

## 2020-02-14 DIAGNOSIS — E519 Thiamine deficiency, unspecified: Secondary | ICD-10-CM | POA: Insufficient documentation

## 2020-02-14 LAB — CULTURE, URINE COMPREHENSIVE: RESULT:: NO GROWTH

## 2020-02-14 LAB — URINALYSIS, ROUTINE W REFLEX MICROSCOPIC
Bacteria, UA: NONE SEEN /HPF
Bilirubin Urine: NEGATIVE
Glucose, UA: NEGATIVE
Hyaline Cast: NONE SEEN /LPF
Ketones, ur: NEGATIVE
Nitrite: NEGATIVE
Specific Gravity, Urine: 1.018 (ref 1.001–1.03)
Squamous Epithelial / HPF: NONE SEEN /HPF (ref <=5)
pH: 6 (ref 5.0–8.0)

## 2020-02-14 LAB — CBC WITH DIFFERENTIAL/PLATELET
Absolute Monocytes: 832 cells/uL (ref 200–950)
Basophils Absolute: 41 cells/uL (ref 0–200)
Basophils Relative: 0.7 %
Eosinophils Absolute: 30 cells/uL (ref 15–500)
Eosinophils Relative: 0.5 %
HCT: 40.6 % (ref 38.5–50.0)
Hemoglobin: 13.6 g/dL (ref 13.2–17.1)
Lymphs Abs: 1003 cells/uL (ref 850–3900)
MCH: 30.5 pg (ref 27.0–33.0)
MCHC: 33.5 g/dL (ref 32.0–36.0)
MCV: 91 fL (ref 80.0–100.0)
MPV: 11.2 fL (ref 7.5–12.5)
Monocytes Relative: 14.1 %
Neutro Abs: 3994 cells/uL (ref 1500–7800)
Neutrophils Relative %: 67.7 %
Platelets: 186 10*3/uL (ref 140–400)
RBC: 4.46 10*6/uL (ref 4.20–5.80)
RDW: 12.8 % (ref 11.0–15.0)
Total Lymphocyte: 17 %
WBC: 5.9 10*3/uL (ref 3.8–10.8)

## 2020-02-14 LAB — FOLATE: Folate: 6.4 ng/mL

## 2020-02-14 LAB — TSH: TSH: 5.25 mIU/L — ABNORMAL HIGH (ref 0.40–4.50)

## 2020-02-14 LAB — VITAMIN B1: Vitamin B1 (Thiamine): 8 nmol/L (ref 8–30)

## 2020-02-14 LAB — RETICULOCYTES
ABS Retic: 66900 cells/uL (ref 25000–9000)
Retic Ct Pct: 1.5 %

## 2020-02-14 LAB — FERRITIN: Ferritin: 59 ng/mL (ref 24–380)

## 2020-02-14 LAB — VITAMIN B12: Vitamin B-12: 249 pg/mL (ref 200–1100)

## 2020-02-14 LAB — IRON: Iron: 42 ug/dL — ABNORMAL LOW (ref 50–180)

## 2020-02-14 LAB — SODIUM, URINE, RANDOM: Sodium, Ur: 55 mmol/L (ref 28–272)

## 2020-02-14 MED ORDER — FUSION PLUS PO CAPS: 1.0000 | ORAL_CAPSULE | Freq: Every day | ORAL | 1 refills | Status: DC

## 2020-02-14 MED ORDER — THIAMINE HCL 50 MG PO TABS: 50.0000 mg | ORAL_TABLET | Freq: Every day | ORAL | 1 refills | Status: DC

## 2020-02-14 MED ORDER — FERRALET 90 90-1 MG PO TABS: 1.0000 | ORAL_TABLET | Freq: Every day | ORAL | 1 refills | Status: DC

## 2020-02-26 ENCOUNTER — Ambulatory Visit: Payer: Medicare Other | Admitting: Internal Medicine

## 2020-02-27 ENCOUNTER — Other Ambulatory Visit: Payer: Self-pay

## 2020-02-27 ENCOUNTER — Ambulatory Visit (INDEPENDENT_AMBULATORY_CARE_PROVIDER_SITE_OTHER): Payer: Medicare Other | Admitting: Internal Medicine

## 2020-02-27 VITALS — BP 130/64 | HR 64 | Temp 98.0°F | Ht 65.5 in | Wt 127.0 lb

## 2020-02-27 DIAGNOSIS — N390 Urinary tract infection, site not specified: Secondary | ICD-10-CM

## 2020-02-27 DIAGNOSIS — Z0000 Encounter for general adult medical examination without abnormal findings: Secondary | ICD-10-CM

## 2020-02-27 DIAGNOSIS — K5904 Chronic idiopathic constipation: Secondary | ICD-10-CM

## 2020-02-27 DIAGNOSIS — R7302 Impaired glucose tolerance (oral): Secondary | ICD-10-CM | POA: Diagnosis not present

## 2020-02-27 NOTE — Patient Instructions (Signed)
Ok to finish the antibiotic as you have  Please continue all other medications as before  Please have the pharmacy call with any other refills you may need.  Please continue your efforts at being more active, low cholesterol diet, and weight control.  Please keep your appointments with your specialists as you may have planned  Please make an Appointment to return in 4 months, or sooner if needed

## 2020-02-27 NOTE — Progress Notes (Signed)
Subjective:    Patient ID: Jason Barton, male    DOB: [birth year], 86 y.o.   MRN: [id number]  HPI  Here to f/u overall doing very well after seeing dr Ronnald Ramp recently with acute constipation, urinary retention, uti, well improved with current meds.  Pt denies chest pain, increased sob or doe, wheezing, orthopnea, PND, increased LE swelling, palpitations, dizziness or syncope.   Pt denies polydipsia, polyuria,.  Denies urinary symptoms such as dysuria, frequency, urgency, flank pain, hematuria or n/v, fever, chills.  Denies worsening reflux, abd pain, dysphagia, n/v, bowel change or blood.   Past Medical History:  Diagnosis Date  . COPD (chronic obstructive pulmonary disease) ([place]) 05/21/2011  . Diverticulosis of colon 05/21/2011  . Hypothyroidism   . Impaired glucose tolerance 06/08/2012  . Increased prostate specific antigen (PSA) velocity 05/24/2011  . Nonmelanoma skin cancer 05/21/2011  . S/P appendectomy 05/21/2011  . S/P inguinal hernia repair 05/21/2011  . S/p small bowel obstruction 05/21/2011  . Thoracic scoliosis 05/21/2011   Past Surgical History:  Procedure Laterality Date  . APPENDECTOMY  1939  . COLON RESECTION     2009 for a "kink in colon" adhesions after appendectomy  . SHOULDER SURGERY     dr sypher; rotater cuff  . STRABISMUS SURGERY Right 11/21/2014   Procedure: REPAIR STRABISMUS RIGHT EYE ;  Surgeon: Everitt Amber, MD;  Location: [facility];  Service: Ophthalmology;  Laterality: Right;  . TONSILLECTOMY  1942    reports that he has never smoked. He has never used smokeless tobacco. He reports current alcohol use. He reports that he does not use drugs. family history includes Arthritis in an other family member; Sudden death in an other family member. Allergies  Allergen Reactions  . Augmentin [Amoxicillin-Pot Clavulanate] Rash    Itching rash to all torso 2 days after start augmentin 12/2017   Current Outpatient Medications on File Prior to Visit    Medication Sig Dispense Refill  . alfuzosin (UROXATRAL) 10 MG 24 hr tablet Take 1 tablet (10 mg total) by mouth daily with breakfast. 90 tablet 1  . Ascorbic Acid (VITAMIN C WITH ROSE HIPS) 500 MG tablet Take 500 mg by mouth daily.    . Cholecalciferol (VITAMIN D PO) Take 5,000 Units by mouth every morning.    . Iron-FA-B Cmp-C-Biot-Probiotic (FUSION PLUS) CAPS Take 1 capsule by mouth daily. 90 capsule 1  . levothyroxine (SYNTHROID) 50 MCG tablet TAKE 1 TABLET BY MOUTH DAILY BEFORE BREAKFAST 90 tablet 3  . lubiprostone (AMITIZA) 8 MCG capsule Take 1 capsule (8 mcg total) by mouth 2 (two) times daily with a meal. 180 capsule 1  . selenium 50 MCG TABS tablet Take 50 mcg by mouth daily.    Marland Kitchen Specialty Vitamins Products (PROSTATE PO) Take by mouth daily.      Marland Kitchen sulfamethoxazole-trimethoprim (BACTRIM DS) 800-160 MG tablet Take 1 tablet by mouth 2 (two) times daily. 60 tablet 0  . thiamine 50 MG tablet Take 1 tablet (50 mg total) by mouth daily. 90 tablet 1  . Turmeric (QC TUMERIC COMPLEX PO) Take by mouth.    . Zinc 50 MG TABS Take by mouth.     No current facility-administered medications on file prior to visit.   Review of Systems All otherwise neg per pt    Objective:   Physical Exam BP 130/64 (BP Location: Left Arm, Patient Position: Sitting, Cuff Size: Normal)   Pulse 64   Temp 98 F (36.7 C) (Oral)  Ht 5' 5.5" (1.664 m)   Wt 127 lb (57.6 kg)   SpO2 98%   BMI 20.81 kg/m  VS noted,  Constitutional: Pt appears in NAD HENT: Head: NCAT.  Right Ear: External ear normal.  Left Ear: External ear normal.  Eyes: . Pupils are equal, round, and reactive to light. Conjunctivae and EOM are normal Nose: without d/c or deformity Neck: Neck supple. Gross normal ROM Cardiovascular: Normal rate and regular rhythm.   Pulmonary/Chest: Effort normal and breath sounds without rales or wheezing.  Abd:  Soft, NT, ND, + BS, no organomegaly Neurological: Pt is alert. At baseline orientation, motor  grossly intact Skin: Skin is warm. No rashes, other new lesions, no LE edema Psychiatric: Pt behavior is normal without agitation  All otherwise neg per pt Lab Results  Component Value Date   WBC 5.9 02/11/2020   HGB 13.6 02/11/2020   HCT 40.6 02/11/2020   PLT 186 02/11/2020   GLUCOSE 110 (H) 02/07/2020   CHOL 171 08/16/2019   TRIG 58.0 08/16/2019   HDL 66.50 08/16/2019   LDLCALC 93 08/16/2019   ALT 18 02/07/2020   AST 20 02/07/2020   NA 130 (L) 02/07/2020   K 3.4 (L) 02/07/2020   CL 96 (L) 02/07/2020   CREATININE 0.89 02/07/2020   BUN 16 02/07/2020   CO2 25 02/07/2020   TSH 5.25 (H) 02/11/2020   HGBA1C 5.1 08/16/2019      Assessment & Plan:

## 2020-02-28 ENCOUNTER — Encounter: Payer: Self-pay | Admitting: Gastroenterology

## 2020-02-29 ENCOUNTER — Encounter: Payer: Self-pay | Admitting: Internal Medicine

## 2020-02-29 DIAGNOSIS — N390 Urinary tract infection, site not specified: Secondary | ICD-10-CM | POA: Insufficient documentation

## 2020-02-29 NOTE — Assessment & Plan Note (Addendum)
Resolve,.  to f/u any worsening symptoms or concerns  I spent 31 minutes in preparing to see the patient by review of recent labs, imaging and procedures, obtaining and reviewing separately obtained history, communicating with the patient and family or caregiver, ordering medications, tests or procedures, and documenting clinical information in the EHR including the differential Dx, treatment, and any further evaluation and other management of uti, constipation, hyperglycemia

## 2020-02-29 NOTE — Assessment & Plan Note (Signed)
Improved, cont current meds

## 2020-02-29 NOTE — Assessment & Plan Note (Signed)
stable overall by history and exam, recent data reviewed with pt, and pt to continue medical treatment as before,  to f/u any worsening symptoms or concerns  

## 2020-03-14 ENCOUNTER — Other Ambulatory Visit: Payer: Self-pay | Admitting: Internal Medicine

## 2020-03-15 NOTE — Telephone Encounter (Signed)
Please refill as per office routine med refill policy (all routine meds refilled for 3 mo or monthly per pt preference up to one year from last visit, then month to month grace period for 3 mo, then further med refills will have to be denied)  

## 2020-03-31 ENCOUNTER — Telehealth: Payer: Self-pay | Admitting: Internal Medicine

## 2020-04-01 ENCOUNTER — Ambulatory Visit: Payer: Medicare Other | Admitting: Gastroenterology

## 2020-04-01 ENCOUNTER — Encounter: Payer: Self-pay | Admitting: Gastroenterology

## 2020-04-01 VITALS — BP 118/70 | HR 63 | Ht 68.0 in | Wt 128.0 lb

## 2020-04-01 DIAGNOSIS — K5900 Constipation, unspecified: Secondary | ICD-10-CM | POA: Diagnosis not present

## 2020-04-01 NOTE — Patient Instructions (Signed)
Continue miralax daily  Follow up as needed  I appreciate the  opportunity to care for you  Thank You   Alonza Bogus, NP-C

## 2020-04-01 NOTE — Progress Notes (Signed)
04/01/2020 Jason Barton [id number] [birth year]   HISTORY OF PRESENT ILLNESS: This is a very pleasant 86 year old male who is new to our office.  Was referred here by Dr. Ronnald Ramp for constipation.  The patient tells me that historically he will sometimes have a bowel movement for 2 or 3 days in a row and then sometimes skip a couple of days without a bowel movement.  Then in August he had a very severe episode of constipation where he did not go for several days.  His daughter took him to the emergency room at [facility].  CT scan of the abdomen pelvis with contrast showed the following:  IMPRESSION: Increased stool throughout colon with increased stool in rectum associated with mild rectal wall thickening question stercoral colitis.  Moderate-sized hiatal hernia with low-attenuation wall thickening question edema/gastritis.  He was disimpacted in the emergency department.  He then saw Dr. Ronnald Ramp and was started on Amitiza twice daily and MiraLAX.  He says that the Amitiza was very expensive and that regimen actually was way too potent for him.  He discontinued the Amitiza and has been taking MiraLAX 1 dose daily.  Has been doing very well for the last several weeks, moving his bowels twice a day with normal formed stools.  He says that he feels great.  He denies any abdominal pain, rectal pain, rectal bleeding.  He says that his appetite is good.  It looks like overall his weight is fairly stable.  He tells me that he's had one colonoscopy that was several years ago in [place], [place] that was normal per his report.  Of note, his mother passed away at the age of 24 years and 20 months old.  Past Medical History:  Diagnosis Date  . COPD (chronic obstructive pulmonary disease) ([place]) 05/21/2011  . Diverticulosis of colon 05/21/2011  . Hypothyroidism   . Impaired glucose tolerance 06/08/2012  . Increased prostate specific antigen (PSA) velocity 05/24/2011  . Nonmelanoma skin  cancer 05/21/2011  . S/P appendectomy 05/21/2011  . S/P inguinal hernia repair 05/21/2011  . S/p small bowel obstruction 05/21/2011  . Thoracic scoliosis 05/21/2011   Past Surgical History:  Procedure Laterality Date  . APPENDECTOMY  1939  . COLON RESECTION     2009 for a "kink in colon" adhesions after appendectomy  . SHOULDER SURGERY     dr sypher; rotater cuff  . STRABISMUS SURGERY Right 11/21/2014   Procedure: REPAIR STRABISMUS RIGHT EYE ;  Surgeon: Everitt Amber, MD;  Location: [facility];  Service: Ophthalmology;  Laterality: Right;  . TONSILLECTOMY  1942    reports that he has never smoked. He has never used smokeless tobacco. He reports previous alcohol use. He reports that he does not use drugs. family history includes Arthritis in an other family member; Sudden death in an other family member. Allergies  Allergen Reactions  . Augmentin [Amoxicillin-Pot Clavulanate] Rash    Itching rash to all torso 2 days after start augmentin 12/2017      Outpatient Encounter Medications as of 04/01/2020  Medication Sig  . alfuzosin (UROXATRAL) 10 MG 24 hr tablet Take 1 tablet (10 mg total) by mouth daily with breakfast.  . Ascorbic Acid (VITAMIN C WITH ROSE HIPS) 500 MG tablet Take 500 mg by mouth daily.  . Cholecalciferol (VITAMIN D PO) Take 5,000 Units by mouth every morning.  . Iron-FA-B Cmp-C-Biot-Probiotic (FUSION PLUS) CAPS Take 1 capsule by mouth daily.  Marland Kitchen levothyroxine (SYNTHROID) 50  MCG tablet TAKE 1 TABLET BY MOUTH DAILY BEFORE BREAKFAST  . polyethylene glycol (MIRALAX / GLYCOLAX) 17 g packet Take 17 g by mouth daily.  Marland Kitchen selenium 50 MCG TABS tablet Take 50 mcg by mouth daily.  Marland Kitchen Specialty Vitamins Products (PROSTATE PO) Take by mouth daily.    Marland Kitchen thiamine 50 MG tablet Take 1 tablet (50 mg total) by mouth daily.  . Turmeric (QC TUMERIC COMPLEX PO) Take by mouth.  . Zinc 50 MG TABS Take by mouth.  . [DISCONTINUED] lubiprostone (AMITIZA) 8 MCG capsule Take 1 capsule (8  mcg total) by mouth 2 (two) times daily with a meal.   No facility-administered encounter medications on file as of 04/01/2020.     REVIEW OF SYSTEMS  : All other systems reviewed and negative except where noted in the History of Present Illness.   PHYSICAL EXAM: BP 118/70   Pulse 63   Ht 5\' 8"  (1.727 m)   Wt 128 lb (58.1 kg)   BMI 19.46 kg/m  General: Well developed white male in no acute distress Head: Normocephalic and atraumatic Eyes:  Sclerae anicteric, conjunctiva pink. Ears: Normal auditory acuity Lungs: Clear throughout to auscultation; no W/R/R. Heart: Regular rate and rhythm; no M/R/G. Abdomen: Soft, non-distended.  BS present.  Non-tender. Musculoskeletal: Symmetrical with no gross deformities  Skin: No lesions on visible extremities Extremities: No edema  Neurological: Alert oriented x 4, grossly non-focal Psychological:  Alert and cooperative. Normal mood and affect  ASSESSMENT AND PLAN: *86 year old male with constipation resulting in a fecal impaction.  Was disimpacted in the ER and started on Amitiza twice daily as well as MiraLAX.  That regimen was too much so he discontinued the Amitiza and is taking MiraLAX 1 dose daily and has been doing very well with that.  Having 2 bowel movements a day.  Is very satisfied with his results.  We will have him continue the MiraLAX for now.  He can follow-up here as needed for any further issues.   CC:  Biagio Borg, MD

## 2020-04-02 ENCOUNTER — Encounter: Payer: Self-pay | Admitting: Internal Medicine

## 2020-04-02 ENCOUNTER — Ambulatory Visit: Payer: Medicare Other | Admitting: Internal Medicine

## 2020-04-02 ENCOUNTER — Other Ambulatory Visit: Payer: Self-pay

## 2020-04-02 VITALS — BP 140/90 | HR 54 | Temp 98.2°F | Ht 68.0 in | Wt 128.0 lb

## 2020-04-02 DIAGNOSIS — G8929 Other chronic pain: Secondary | ICD-10-CM | POA: Diagnosis not present

## 2020-04-02 DIAGNOSIS — K5904 Chronic idiopathic constipation: Secondary | ICD-10-CM

## 2020-04-02 DIAGNOSIS — M545 Low back pain, unspecified: Secondary | ICD-10-CM

## 2020-04-02 DIAGNOSIS — R7302 Impaired glucose tolerance (oral): Secondary | ICD-10-CM | POA: Diagnosis not present

## 2020-04-02 MED ORDER — TRAMADOL HCL 50 MG PO TABS: 50.0000 mg | ORAL_TABLET | Freq: Four times a day (QID) | ORAL | 0 refills | Status: DC | PRN

## 2020-04-02 MED ORDER — TIZANIDINE HCL 2 MG PO TABS: 2.0000 mg | ORAL_TABLET | Freq: Four times a day (QID) | ORAL | 1 refills | Status: DC | PRN

## 2020-04-02 NOTE — Telephone Encounter (Signed)
error 

## 2020-04-02 NOTE — Progress Notes (Signed)
Subjective:    Patient ID: Jason Barton, male    DOB: 1925-07-15, 86 y.o.   MRN: [id number]  HPI  Here with acute onset left lower back pain; pt recalls an initial injury 12 yrs ago with rushing to a meeting, almost had surgury for unclear reasons, and has occasional lumbar upper area sharp pains for many yrs since then, as well as evolving severe whole spine kyphoscoliosis, but most recently c/o left LBP x 3 days after lifting heavy manure bags, now mild to mod, dull, without bowel or bladder change, fever, wt loss,  worsening LE pain/numbness/weakness, gait change or falls.  Pain is worse to turn , twist and get up from standing.  Had some pennsaid and seemed to help to begin with, but not today.  Denies worsening reflux, abd pain, dysphagia, n/v, bowel change or blood.  Denies urinary symptoms such as dysuria, frequency, urgency, flank pain, hematuria or n/v, fever, chills. Pt denies chest pain, increased sob or doe, wheezing, orthopnea, PND, increased LE swelling, palpitations, dizziness or syncope.   Pt denies polydipsia, polyuria,   Past Medical History:  Diagnosis Date  . COPD (chronic obstructive pulmonary disease) ([place]) 05/21/2011  . Diverticulosis of colon 05/21/2011  . Hypothyroidism   . Impaired glucose tolerance 06/08/2012  . Increased prostate specific antigen (PSA) velocity 05/24/2011  . Nonmelanoma skin cancer 05/21/2011  . S/P appendectomy 05/21/2011  . S/P inguinal hernia repair 05/21/2011  . S/p small bowel obstruction 05/21/2011  . Thoracic scoliosis 05/21/2011   Past Surgical History:  Procedure Laterality Date  . APPENDECTOMY  1939  . COLON RESECTION     2009 for a "kink in colon" adhesions after appendectomy  . SHOULDER SURGERY     dr sypher; rotater cuff  . STRABISMUS SURGERY Right 11/21/2014   Procedure: REPAIR STRABISMUS RIGHT EYE ;  Surgeon: Everitt Amber, MD;  Location: [facility];  Service: Ophthalmology;  Laterality: Right;  . TONSILLECTOMY  1942     reports that he has never smoked. He has never used smokeless tobacco. He reports previous alcohol use. He reports that he does not use drugs. family history includes Arthritis in an other family member; Sudden death in an other family member. Allergies  Allergen Reactions  . Augmentin [Amoxicillin-Pot Clavulanate] Rash    Itching rash to all torso 2 days after start augmentin 12/2017   Current Outpatient Medications on File Prior to Visit  Medication Sig Dispense Refill  . alfuzosin (UROXATRAL) 10 MG 24 hr tablet Take 1 tablet (10 mg total) by mouth daily with breakfast. 90 tablet 1  . Ascorbic Acid (VITAMIN C WITH ROSE HIPS) 500 MG tablet Take 500 mg by mouth daily.    . Cholecalciferol (VITAMIN D PO) Take 5,000 Units by mouth every morning.    . Iron-FA-B Cmp-C-Biot-Probiotic (FUSION PLUS) CAPS Take 1 capsule by mouth daily. 90 capsule 1  . levothyroxine (SYNTHROID) 50 MCG tablet TAKE 1 TABLET BY MOUTH DAILY BEFORE BREAKFAST 90 tablet 3  . polyethylene glycol (MIRALAX / GLYCOLAX) 17 g packet Take 17 g by mouth daily.    Marland Kitchen selenium 50 MCG TABS tablet Take 50 mcg by mouth daily.    Marland Kitchen Specialty Vitamins Products (PROSTATE PO) Take by mouth daily.      Marland Kitchen thiamine (VITAMIN B-1) 100 MG tablet SMARTSIG:By Mouth    . thiamine 50 MG tablet Take 1 tablet (50 mg total) by mouth daily. 90 tablet 1  . Turmeric (QC TUMERIC COMPLEX PO) Take  by mouth.    . Zinc 50 MG TABS Take by mouth.     No current facility-administered medications on file prior to visit.   Review of Systems All otherwise neg per pt    Objective:   Physical Exam BP 140/90 (BP Location: Left Arm, Patient Position: Sitting, Cuff Size: Large)   Pulse (!) 54   Temp 98.2 F (36.8 C) (Oral)   Ht 5\' 8"  (1.727 m)   Wt 128 lb (58.1 kg)   SpO2 96%   BMI 19.46 kg/m  VS noted,  Constitutional: Pt appears in NAD HENT: Head: NCAT.  Right Ear: External ear normal.  Left Ear: External ear normal.  Eyes: . Pupils are equal,  round, and reactive to light. Conjunctivae and EOM are normal Nose: without d/c or deformity Neck: Neck supple. Gross normal ROM Cardiovascular: Normal rate and regular rhythm.   Pulmonary/Chest: Effort normal and breath sounds without rales or wheezing.  Abd:  Soft, NT, ND, + BS, no organomegaly Spine nontender, but has some tender left lumbar paravertebral spasm tender Neurological: Pt is alert. At baseline orientation, motor grossly intact Skin: Skin is warm. No rashes, other new lesions, no LE edema Psychiatric: Pt behavior is normal without agitation  All otherwise neg per pt Lab Results  Component Value Date   WBC 5.9 02/11/2020   HGB 13.6 02/11/2020   HCT 40.6 02/11/2020   PLT 186 02/11/2020   GLUCOSE 110 (H) 02/07/2020   CHOL 171 08/16/2019   TRIG 58.0 08/16/2019   HDL 66.50 08/16/2019   LDLCALC 93 08/16/2019   ALT 18 02/07/2020   AST 20 02/07/2020   NA 130 (L) 02/07/2020   K 3.4 (L) 02/07/2020   CL 96 (L) 02/07/2020   CREATININE 0.89 02/07/2020   BUN 16 02/07/2020   CO2 25 02/07/2020   TSH 5.25 (H) 02/11/2020   HGBA1C 5.1 08/16/2019      Assessment & Plan:

## 2020-04-02 NOTE — Patient Instructions (Signed)
Please take all new medication as prescribed - the pain medication, and the muscle relaxer as needed  Also please try the OTC Salon Paz (lidocaine patch) as needed topically as well  Please continue all other medications as before, and refills have been done if requested.  Please have the pharmacy call with any other refills you may need.  Please keep your appointments with your specialists as you may have planned

## 2020-04-02 NOTE — Progress Notes (Signed)
Agree with assessment and plan as outlined.  

## 2020-04-02 NOTE — Assessment & Plan Note (Signed)
stable overall by history and exam, recent data reviewed with pt, and pt to continue medical treatment as before,  to f/u any worsening symptoms or concerns  

## 2020-04-02 NOTE — Assessment & Plan Note (Addendum)
C/w msk spasm, for tramadol prn, tizanidine prn, and otc salon paz prn,  to f/u any worsening symptoms or concerns  I spent 31 minutes in preparing to see the patient by review of recent labs, imaging and procedures, obtaining and reviewing separately obtained history, communicating with the patient and family or caregiver, ordering medications, tests or procedures, and documenting clinical information in the EHR including the differential Dx, treatment, and any further evaluation and other management of lbp, constipatoin, hypergycemia

## 2020-04-15 ENCOUNTER — Other Ambulatory Visit (HOSPITAL_COMMUNITY): Payer: Self-pay | Admitting: Internal Medicine

## 2020-04-15 ENCOUNTER — Ambulatory Visit: Payer: Medicare Other | Attending: Internal Medicine

## 2020-04-15 DIAGNOSIS — Z23 Encounter for immunization: Secondary | ICD-10-CM

## 2020-04-15 NOTE — Progress Notes (Signed)
   Covid-19 Vaccination Clinic  Name:  SHAYDON LEASE    MRN: [id number] DOB: 02/08/1926  04/15/2020  Mr. Acy was observed post Covid-19 immunization for 15 minutes without incident. He was provided with Vaccine Information Sheet and instruction to access the V-Safe system.   Mr. Abreu was instructed to call 911 with any severe reactions post vaccine: Marland Kitchen Difficulty breathing  . Swelling of face and throat  . A fast heartbeat  . A bad rash all over body  . Dizziness and weakness

## 2020-04-21 ENCOUNTER — Telehealth: Payer: Self-pay | Admitting: Internal Medicine

## 2020-04-21 DIAGNOSIS — M545 Low back pain, unspecified: Secondary | ICD-10-CM

## 2020-04-21 NOTE — Telephone Encounter (Signed)
Patient called and is requesting a referral to Dr. Tamala Julian in sports med for his lower back pain.   Please call the patient if referral is done [phone].

## 2020-04-21 NOTE — Telephone Encounter (Signed)
Sent to Dr. John. 

## 2020-04-21 NOTE — Telephone Encounter (Signed)
Ok this is done 

## 2020-04-27 NOTE — Progress Notes (Signed)
[facility] [address] [place] Phone: [phone] Subjective:   I Kandace Blitz am serving as a Education administrator for Dr. Hulan Saas.  This visit occurred during the SARS-CoV-2 public health emergency.  Safety protocols were in place, including screening questions prior to the visit, additional usage of staff PPE, and extensive cleaning of exam room while observing appropriate contact time as indicated for disinfecting solutions.   I'm seeing this patient by the request  of:  Biagio Borg, MD  CC: Low back pain  [id number]  KYJUAN GAUSE is a 86 y.o. male coming in with complaint of low back pain. Patient states he was picking up a bag of cow manure when he felt pain. Left sided lower back pain. Dr. Jenny Reichmann prescribed some medications. Using heat and ice as well. Pain with picking things up. Getting in bed is painful. Feels like an "electric shock". 9/10 at its worse.    Patient's imaging including a CT abdomen pelvis with contrast on February 07, 2020.  Patient was found to have constipation, hiatal hernia as well as likely gastritis as well as enlargement of the prostate.  Past Medical History:  Diagnosis Date  . COPD (chronic obstructive pulmonary disease) ([place]) 05/21/2011  . Diverticulosis of colon 05/21/2011  . Hypothyroidism   . Impaired glucose tolerance 06/08/2012  . Increased prostate specific antigen (PSA) velocity 05/24/2011  . Nonmelanoma skin cancer 05/21/2011  . S/P appendectomy 05/21/2011  . S/P inguinal hernia repair 05/21/2011  . S/p small bowel obstruction 05/21/2011  . Thoracic scoliosis 05/21/2011   Past Surgical History:  Procedure Laterality Date  . APPENDECTOMY  1939  . COLON RESECTION     2009 for a "kink in colon" adhesions after appendectomy  . SHOULDER SURGERY     dr sypher; rotater cuff  . STRABISMUS SURGERY Right 11/21/2014   Procedure: REPAIR STRABISMUS RIGHT EYE ;  Surgeon: Everitt Amber, MD;  Location: [facility];  Service: Ophthalmology;  Laterality: Right;  . TONSILLECTOMY  1942   Social History   Socioeconomic History  . Marital status: Married    Spouse name: Not on file  . Number of children: 4  . Years of education: MBA  . Highest education level: Not on file  Occupational History  . Occupation: retired  Tobacco Use  . Smoking status: Never Smoker  . Smokeless tobacco: Never Used  Vaping Use  . Vaping Use: Never used  Substance and Sexual Activity  . Alcohol use: Not Currently  . Drug use: No  . Sexual activity: Not on file  Other Topics Concern  . Not on file  Social History Narrative  . Not on file   Social Determinants of Health   Financial Resource Strain:   . Difficulty of Paying Living Expenses: Not on file  Food Insecurity:   . Worried About Charity fundraiser in the Last Year: Not on file  . Ran Out of Food in the Last Year: Not on file  Transportation Needs:   . Lack of Transportation (Medical): Not on file  . Lack of Transportation (Non-Medical): Not on file  Physical Activity:   . Days of Exercise per Week: Not on file  . Minutes of Exercise per Session: Not on file  Stress:   . Feeling of Stress : Not on file  Social Connections:   . Frequency of Communication with Friends and Family: Not on file  . Frequency of Social Gatherings with  Friends and Family: Not on file  . Attends Religious Services: Not on file  . Active Member of Clubs or Organizations: Not on file  . Attends Archivist Meetings: Not on file  . Marital Status: Not on file   Allergies  Allergen Reactions  . Augmentin [Amoxicillin-Pot Clavulanate] Rash    Itching rash to all torso 2 days after start augmentin 12/2017   Family History  Problem Relation Age of Onset  . Arthritis Other   . Sudden death Other   . Colon cancer Neg Hx   . Esophageal cancer Neg Hx   . Rectal cancer Neg Hx     Current Outpatient Medications (Endocrine & Metabolic):  .   levothyroxine (SYNTHROID) 50 MCG tablet, TAKE 1 TABLET BY MOUTH DAILY BEFORE BREAKFAST    Current Outpatient Medications (Analgesics):  .  traMADol (ULTRAM) 50 MG tablet, Take 1 tablet (50 mg total) by mouth every 6 (six) hours as needed.  Current Outpatient Medications (Hematological):  Marland Kitchen  Iron-FA-B Cmp-C-Biot-Probiotic (FUSION PLUS) CAPS, Take 1 capsule by mouth daily.  Current Outpatient Medications (Other):  .  alfuzosin (UROXATRAL) 10 MG 24 hr tablet, Take 1 tablet (10 mg total) by mouth daily with breakfast. .  Ascorbic Acid (VITAMIN C WITH ROSE HIPS) 500 MG tablet, Take 500 mg by mouth daily. .  Cholecalciferol (VITAMIN D PO), Take 5,000 Units by mouth every morning. .  polyethylene glycol (MIRALAX / GLYCOLAX) 17 g packet, Take 17 g by mouth daily. Marland Kitchen  selenium 50 MCG TABS tablet, Take 50 mcg by mouth daily. Marland Kitchen  Specialty Vitamins Products (PROSTATE PO)*, Take by mouth daily.   Marland Kitchen  thiamine (VITAMIN B-1) 100 MG tablet, SMARTSIG:By Mouth .  thiamine 50 MG tablet, Take 1 tablet (50 mg total) by mouth daily. Marland Kitchen  tiZANidine (ZANAFLEX) 2 MG tablet, Take 1 tablet (2 mg total) by mouth every 6 (six) hours as needed for muscle spasms. .  Turmeric (QC TUMERIC COMPLEX PO), Take by mouth. .  Zinc 50 MG TABS, Take by mouth. * These medications belong to multiple therapeutic classes and are listed under each applicable group.   Reviewed prior external information including notes and imaging from  primary care provider As well as notes that were available from care everywhere and other healthcare systems.  Past medical history, social, surgical and family history all reviewed in electronic medical record.  No pertanent information unless stated regarding to the chief complaint.   Review of Systems:  No headache, visual changes, nausea, vomiting, diarrhea, constipation, dizziness, abdominal pain, skin rash, fevers, chills, night sweats, weight loss, swollen lymph nodes, body aches, joint  swelling, chest pain, shortness of breath, mood changes. POSITIVE muscle aches  Objective  Blood pressure (!) 142/90, pulse 72, height 5\' 8"  (1.727 m), weight 128 lb (58.1 kg), SpO2 97 %.   General: No apparent distress alert and oriented x3 mood and affect normal, dressed appropriately.  HEENT: Pupils equal, extraocular movements intact  Respiratory: Patient's speak in full sentences and does not appear short of breath  Cardiovascular: No lower extremity edema, non tender, no erythema  Gait mild antalgic Patient's abdominal exam shows the patient does have some distention noted.  Stool burden palpated in the left lower quadrant.  No true masses appreciated. MSK: Arthritic changes of multiple joints Low back exam mild loss of lordosis.  Thoracic scoliosis noted.  Patient does have tenderness to palpation minorly in the paraspinal musculature of the lumbar spine.  Patient does have a  negative straight leg test.  Neurovascularly intact distally.  Negative FABER test bilaterally    Impression and Recommendations:     The above documentation has been reviewed and is accurate and complete Lyndal Pulley, DO

## 2020-04-28 ENCOUNTER — Encounter: Payer: Self-pay | Admitting: Family Medicine

## 2020-04-28 ENCOUNTER — Ambulatory Visit (INDEPENDENT_AMBULATORY_CARE_PROVIDER_SITE_OTHER): Payer: Medicare Other

## 2020-04-28 ENCOUNTER — Ambulatory Visit: Payer: Medicare Other | Admitting: Family Medicine

## 2020-04-28 ENCOUNTER — Other Ambulatory Visit: Payer: Self-pay

## 2020-04-28 VITALS — BP 142/90 | HR 72 | Ht 68.0 in | Wt 128.0 lb

## 2020-04-28 DIAGNOSIS — G8929 Other chronic pain: Secondary | ICD-10-CM

## 2020-04-28 DIAGNOSIS — M545 Low back pain, unspecified: Secondary | ICD-10-CM | POA: Diagnosis not present

## 2020-04-28 DIAGNOSIS — K5904 Chronic idiopathic constipation: Secondary | ICD-10-CM | POA: Diagnosis not present

## 2020-04-28 NOTE — Assessment & Plan Note (Signed)
We discussed continuing the MiraLAX but add Colace.  We will could be beneficial.  Patient does feel like to have some stool burden in the left lower quadrant

## 2020-04-28 NOTE — Assessment & Plan Note (Signed)
Continued low back pain.  Patient has had it on the side than previously.  Recently did have an injury and seems to have more hip flexor static strain.  Exercises given.  Patient has been given Zanaflex and tramadol by primary care provider.  X-rays pending.  Patient was also found to have some oxalate crystals so we discussed over-the-counter medications that could be beneficial for that.  Follow-up with me again 4 to 6 weeks

## 2020-04-28 NOTE — Patient Instructions (Addendum)
Good to see you.  Xray of the back today Exercises 3 times a week hip flexor Tart cherry extract 1200mg  at night  colace 100 mg if stool gets very watery stop Ice 20 mins 2 times a day See me again in 4-6 weeks

## 2020-06-03 ENCOUNTER — Ambulatory Visit: Payer: Medicare Other | Admitting: Family Medicine

## 2020-06-04 ENCOUNTER — Other Ambulatory Visit: Payer: Self-pay

## 2020-06-04 ENCOUNTER — Encounter: Payer: Self-pay | Admitting: Family Medicine

## 2020-06-04 ENCOUNTER — Ambulatory Visit (INDEPENDENT_AMBULATORY_CARE_PROVIDER_SITE_OTHER): Payer: Medicare Other | Admitting: Family Medicine

## 2020-06-04 DIAGNOSIS — M545 Low back pain, unspecified: Secondary | ICD-10-CM | POA: Diagnosis not present

## 2020-06-04 DIAGNOSIS — G8929 Other chronic pain: Secondary | ICD-10-CM | POA: Diagnosis not present

## 2020-06-04 NOTE — Progress Notes (Signed)
[facility] [address] [place] Phone: [phone] Subjective:   I Kandace Blitz am serving as a Education administrator for Dr. Hulan Saas.  This visit occurred during the SARS-CoV-2 public health emergency.  Safety protocols were in place, including screening questions prior to the visit, additional usage of staff PPE, and extensive cleaning of exam room while observing appropriate contact time as indicated for disinfecting solutions.   I'm seeing this patient by the request  of:  Biagio Borg, MD  CC: Low back pain follow-up  ALP:[id number]   04/28/2020 Continued low back pain.  Patient has had it on the side than previously.  Recently did have an injury and seems to have more hip flexor static strain.  Exercises given.  Patient has been given Zanaflex and tramadol by primary care provider.  X-rays pending.  Patient was also found to have some oxalate crystals so we discussed over-the-counter medications that could be beneficial for that.  Follow-up with me again 4 to 6 weeks  We discussed continuing the MiraLAX but add Colace.  We will could be beneficial.  Patient does feel like to have some stool burden in the left lower quadrant  Update 06/04/2020 WADDELL ITEN is a 86 y.o. male coming in with complaint of lower back pain. Patient states he is doing great since using ice.  Patient states that he is feeling 90% better.  Able to do daily activities with no problems.  Patient continues to be very active.  Patient denies any weakness of any lower extremities.    Patient's x-rays show that patient did have moderate degenerative disc disease throughout the lumbar spine these were taken on November 10 and reviewed today large stool burden was noted as well Past Medical History:  Diagnosis Date  . COPD (chronic obstructive pulmonary disease) ([place]) 05/21/2011  . Diverticulosis of colon 05/21/2011  . Hypothyroidism   . Impaired glucose tolerance  06/08/2012  . Increased prostate specific antigen (PSA) velocity 05/24/2011  . Nonmelanoma skin cancer 05/21/2011  . S/P appendectomy 05/21/2011  . S/P inguinal hernia repair 05/21/2011  . S/p small bowel obstruction 05/21/2011  . Thoracic scoliosis 05/21/2011   Past Surgical History:  Procedure Laterality Date  . APPENDECTOMY  1939  . COLON RESECTION     2009 for a "kink in colon" adhesions after appendectomy  . SHOULDER SURGERY     dr sypher; rotater cuff  . STRABISMUS SURGERY Right 11/21/2014   Procedure: REPAIR STRABISMUS RIGHT EYE ;  Surgeon: Everitt Amber, MD;  Location: [facility];  Service: Ophthalmology;  Laterality: Right;  . TONSILLECTOMY  1942   Social History   Socioeconomic History  . Marital status: Married    Spouse name: Not on file  . Number of children: 4  . Years of education: MBA  . Highest education level: Not on file  Occupational History  . Occupation: retired  Tobacco Use  . Smoking status: Never Smoker  . Smokeless tobacco: Never Used  Vaping Use  . Vaping Use: Never used  Substance and Sexual Activity  . Alcohol use: Not Currently  . Drug use: No  . Sexual activity: Not on file  Other Topics Concern  . Not on file  Social History Narrative  . Not on file   Social Determinants of Health   Financial Resource Strain: Not on file  Food Insecurity: Not on file  Transportation Needs: Not on file  Physical Activity: Not on file  Stress: Not on file  Social Connections: Not on file   Allergies  Allergen Reactions  . Augmentin [Amoxicillin-Pot Clavulanate] Rash    Itching rash to all torso 2 days after start augmentin 12/2017   Family History  Problem Relation Age of Onset  . Arthritis Other   . Sudden death Other   . Colon cancer Neg Hx   . Esophageal cancer Neg Hx   . Rectal cancer Neg Hx     Current Outpatient Medications (Endocrine & Metabolic):  .  levothyroxine (SYNTHROID) 50 MCG tablet, TAKE 1 TABLET BY MOUTH DAILY  BEFORE BREAKFAST    Current Outpatient Medications (Analgesics):  .  traMADol (ULTRAM) 50 MG tablet, Take 1 tablet (50 mg total) by mouth every 6 (six) hours as needed.  Current Outpatient Medications (Hematological):  Marland Kitchen  Iron-FA-B Cmp-C-Biot-Probiotic (FUSION PLUS) CAPS, Take 1 capsule by mouth daily.  Current Outpatient Medications (Other):  .  alfuzosin (UROXATRAL) 10 MG 24 hr tablet, Take 1 tablet (10 mg total) by mouth daily with breakfast. .  Ascorbic Acid (VITAMIN C WITH ROSE HIPS) 500 MG tablet, Take 500 mg by mouth daily. .  Cholecalciferol (VITAMIN D PO), Take 5,000 Units by mouth every morning. .  polyethylene glycol (MIRALAX / GLYCOLAX) 17 g packet, Take 17 g by mouth daily. Marland Kitchen  selenium 50 MCG TABS tablet, Take 50 mcg by mouth daily. Marland Kitchen  Specialty Vitamins Products (PROSTATE PO)*, Take by mouth daily. Marland Kitchen  thiamine (VITAMIN B-1) 100 MG tablet, SMARTSIG:By Mouth .  thiamine 50 MG tablet, Take 1 tablet (50 mg total) by mouth daily. Marland Kitchen  tiZANidine (ZANAFLEX) 2 MG tablet, Take 1 tablet (2 mg total) by mouth every 6 (six) hours as needed for muscle spasms. .  Turmeric (QC TUMERIC COMPLEX PO), Take by mouth. .  Zinc 50 MG TABS, Take by mouth. * These medications belong to multiple therapeutic classes and are listed under each applicable group.   Reviewed prior external information including notes and imaging from  primary care provider As well as notes that were available from care everywhere and other healthcare systems.  Past medical history, social, surgical and family history all reviewed in electronic medical record.  No pertanent information unless stated regarding to the chief complaint.   Review of Systems:  No headache, visual changes, nausea, vomiting, diarrhea, constipation, dizziness, abdominal pain, skin rash, fevers, chills, night sweats, weight loss, swollen lymph nodes, body aches, joint swelling, chest pain, shortness of breath, mood changes. POSITIVE muscle  aches  Objective  Blood pressure 100/80, pulse 99, height 5\' 8"  (1.727 m), weight 131 lb (59.4 kg), SpO2 (!) 62 %.   General: No apparent distress alert and oriented x3 mood and affect normal, dressed appropriately.  HEENT: Pupils equal, extraocular movements intact  Respiratory: Patient's speak in full sentences and does not appear short of breath  Cardiovascular: Trace lower extremity edema, non tender, no erythema  Patient's back does have significant scoliosis and degenerative scoliosis with loss of lordosis.  Patient is nontender on exam.  Patient has good strength in the legs bilaterally.  Deep tendon reflexes are intact    Impression and Recommendations:    The above documentation has been reviewed and is accurate and complete Lyndal Pulley, DO

## 2020-06-04 NOTE — Assessment & Plan Note (Signed)
Patient states that he is 90% better.  Much more comfortable in sitting at this time.  Having no radicular symptoms and good strength in the legs.  Patient has not taken any pain medications at this time including the Zanaflex.  Patient will continue with the conservative therapy will have him make an appointment in 3 months just in case otherwise can follow-up as needed

## 2020-06-04 NOTE — Patient Instructions (Signed)
Good to see you Ice when you need to Stretch after walking See me again in 3 months Call me if you need me sooner If perfect cancel the appointment Happy Holidays!

## 2020-08-18 ENCOUNTER — Other Ambulatory Visit: Payer: Medicare Other

## 2020-08-24 ENCOUNTER — Encounter: Payer: Medicare Other | Admitting: Internal Medicine

## 2020-08-24 ENCOUNTER — Ambulatory Visit: Payer: Medicare Other | Admitting: Family Medicine

## 2020-08-28 ENCOUNTER — Other Ambulatory Visit: Payer: Medicare Other

## 2020-08-28 ENCOUNTER — Other Ambulatory Visit: Payer: Self-pay

## 2020-08-28 ENCOUNTER — Other Ambulatory Visit (INDEPENDENT_AMBULATORY_CARE_PROVIDER_SITE_OTHER): Payer: Medicare Other

## 2020-08-28 DIAGNOSIS — R7302 Impaired glucose tolerance (oral): Secondary | ICD-10-CM

## 2020-08-28 DIAGNOSIS — Z0000 Encounter for general adult medical examination without abnormal findings: Secondary | ICD-10-CM

## 2020-08-28 LAB — URINALYSIS, ROUTINE W REFLEX MICROSCOPIC
Bilirubin Urine: NEGATIVE
Hgb urine dipstick: NEGATIVE
Ketones, ur: NEGATIVE
Leukocytes,Ua: NEGATIVE
Nitrite: NEGATIVE
Specific Gravity, Urine: 1.02 (ref 1.000–1.030)
Total Protein, Urine: NEGATIVE
Urine Glucose: NEGATIVE
Urobilinogen, UA: 0.2 (ref 0.0–1.0)
pH: 7 (ref 5.0–8.0)

## 2020-08-28 LAB — CBC WITH DIFFERENTIAL/PLATELET
Basophils Absolute: 0 10*3/uL (ref 0.0–0.1)
Basophils Relative: 0.6 % (ref 0.0–3.0)
Eosinophils Absolute: 0.1 10*3/uL (ref 0.0–0.7)
Eosinophils Relative: 2.6 % (ref 0.0–5.0)
HCT: 43 % (ref 39.0–52.0)
Hemoglobin: 14.4 g/dL (ref 13.0–17.0)
Lymphocytes Relative: 32.3 % (ref 12.0–46.0)
Lymphs Abs: 1.4 10*3/uL (ref 0.7–4.0)
MCHC: 33.5 g/dL (ref 30.0–36.0)
MCV: 89.5 fl (ref 78.0–100.0)
Monocytes Absolute: 0.6 10*3/uL (ref 0.1–1.0)
Monocytes Relative: 13.8 % — ABNORMAL HIGH (ref 3.0–12.0)
Neutro Abs: 2.2 10*3/uL (ref 1.4–7.7)
Neutrophils Relative %: 50.7 % (ref 43.0–77.0)
Platelets: 130 10*3/uL — ABNORMAL LOW (ref 150.0–400.0)
RBC: 4.81 Mil/uL (ref 4.22–5.81)
RDW: 14.2 % (ref 11.5–15.5)
WBC: 4.4 10*3/uL (ref 4.0–10.5)

## 2020-08-28 LAB — BASIC METABOLIC PANEL
BUN: 16 mg/dL (ref 6–23)
CO2: 30 mEq/L (ref 19–32)
Calcium: 9.1 mg/dL (ref 8.4–10.5)
Chloride: 104 mEq/L (ref 96–112)
Creatinine, Ser: 0.93 mg/dL (ref 0.40–1.50)
GFR: 71.35 mL/min (ref >60.00)
Glucose, Bld: 94 mg/dL (ref 70–99)
Potassium: 4.1 mEq/L (ref 3.5–5.1)
Sodium: 139 mEq/L (ref 135–145)

## 2020-08-28 LAB — HEPATIC FUNCTION PANEL
ALT: 13 U/L (ref 0–53)
AST: 16 U/L (ref 0–37)
Albumin: 3.8 g/dL (ref 3.5–5.2)
Alkaline Phosphatase: 80 U/L (ref 39–117)
Bilirubin, Direct: 0.2 mg/dL (ref 0.0–0.3)
Total Bilirubin: 1 mg/dL (ref 0.2–1.2)
Total Protein: 6.4 g/dL (ref 6.0–8.3)

## 2020-08-28 LAB — LIPID PANEL
Cholesterol: 153 mg/dL (ref 0–200)
HDL: 61.3 mg/dL (ref >39.00)
LDL Cholesterol: 79 mg/dL (ref 0–99)
NonHDL: 91.42
Total CHOL/HDL Ratio: 2
Triglycerides: 60 mg/dL (ref 0.0–149.0)
VLDL: 12 mg/dL (ref 0.0–40.0)

## 2020-08-28 LAB — TSH: TSH: 4.49 u[IU]/mL (ref 0.35–4.50)

## 2020-08-28 LAB — HEMOGLOBIN A1C: Hgb A1c MFr Bld: 5.4 % (ref 4.6–6.5)

## 2020-08-28 NOTE — Addendum Note (Signed)
Addended by: Jacobo Forest on: 08/28/2020 10:00 AM   Modules accepted: Orders

## 2020-09-02 ENCOUNTER — Ambulatory Visit (INDEPENDENT_AMBULATORY_CARE_PROVIDER_SITE_OTHER): Payer: Medicare Other | Admitting: Internal Medicine

## 2020-09-02 ENCOUNTER — Encounter: Payer: Self-pay | Admitting: Internal Medicine

## 2020-09-02 ENCOUNTER — Other Ambulatory Visit: Payer: Self-pay

## 2020-09-02 DIAGNOSIS — Z0000 Encounter for general adult medical examination without abnormal findings: Secondary | ICD-10-CM

## 2020-09-02 NOTE — Patient Instructions (Signed)
Please continue all other medications as before, and refills have been done if requested. ° °Please have the pharmacy call with any other refills you may need. ° °Please continue your efforts at being more active, low cholesterol diet, and weight control. ° °You are otherwise up to date with prevention measures today. ° °Please keep your appointments with your specialists as you may have planned ° °Please make an Appointment to return in 6 months, or sooner if needed °

## 2020-09-02 NOTE — Progress Notes (Signed)
Patient ID: Jason Barton, male   DOB: 25-Jun-1925, 87 y.o.   MRN: [id number]         Chief Complaint:: wellness exam        HPI:  Jason Barton is a 87 y.o. male here for wellness exam; already up to date with preventive referrals and immunizations.  Has no specific complaints.  Pt denies chest pain, increased sob or doe, wheezing, orthopnea, PND, increased LE swelling, palpitations, dizziness or syncope.  Denies new worsening focal neuro s/s.   Pt denies fever, wt loss, night sweats, loss of appetite, or other constitutional symptoms    Wt Readings from Last 3 Encounters:  09/02/20 130 lb (59 kg)  06/04/20 131 lb (59.4 kg)  04/28/20 128 lb (58.1 kg)   BP Readings from Last 3 Encounters:  09/02/20 130/68  06/04/20 100/80  04/28/20 (!) 142/90   Immunization History  Administered Date(s) Administered  . Influenza, Seasonal, Injecte, Preservative Fre 07/17/2012  . PFIZER(Purple Top)SARS-COV-2 Vaccination 07/26/2019, 08/20/2019, 04/15/2020  . Pneumococcal Conjugate-13 06/11/2013  . Pneumococcal Polysaccharide-23 08/17/2017  . Zoster 07/24/2014  There are no preventive care reminders to display for this patient.    Past Medical History:  Diagnosis Date  . COPD (chronic obstructive pulmonary disease) ([place]) 05/21/2011  . Diverticulosis of colon 05/21/2011  . Hypothyroidism   . Impaired glucose tolerance 06/08/2012  . Increased prostate specific antigen (PSA) velocity 05/24/2011  . Nonmelanoma skin cancer 05/21/2011  . S/P appendectomy 05/21/2011  . S/P inguinal hernia repair 05/21/2011  . S/p small bowel obstruction 05/21/2011  . Thoracic scoliosis 05/21/2011   Past Surgical History:  Procedure Laterality Date  . APPENDECTOMY  1939  . COLON RESECTION     2009 for a "kink in colon" adhesions after appendectomy  . SHOULDER SURGERY     dr sypher; rotater cuff  . STRABISMUS SURGERY Right 11/21/2014   Procedure: REPAIR STRABISMUS RIGHT EYE ;  Surgeon: Everitt Amber, MD;  Location:  [facility];  Service: Ophthalmology;  Laterality: Right;  . TONSILLECTOMY  1942    reports that he has never smoked. He has never used smokeless tobacco. He reports previous alcohol use. He reports that he does not use drugs. family history includes Arthritis in an other family member; Sudden death in an other family member. Allergies  Allergen Reactions  . Augmentin [Amoxicillin-Pot Clavulanate] Rash    Itching rash to all torso 2 days after start augmentin 12/2017   Current Outpatient Medications on File Prior to Visit  Medication Sig Dispense Refill  . Ascorbic Acid (VITAMIN C WITH ROSE HIPS) 500 MG tablet Take 500 mg by mouth daily.    . Cholecalciferol (VITAMIN D PO) Take 5,000 Units by mouth every morning.    . Iron-FA-B Cmp-C-Biot-Probiotic (FUSION PLUS) CAPS Take 1 capsule by mouth daily. 90 capsule 1  . levothyroxine (SYNTHROID) 50 MCG tablet TAKE 1 TABLET BY MOUTH DAILY BEFORE BREAKFAST 90 tablet 3  . polyethylene glycol (MIRALAX / GLYCOLAX) 17 g packet Take 17 g by mouth daily.    Marland Kitchen selenium 50 MCG TABS tablet Take 50 mcg by mouth daily.    Marland Kitchen Specialty Vitamins Products (PROSTATE PO) Take by mouth daily.    Marland Kitchen thiamine (VITAMIN B-1) 100 MG tablet SMARTSIG:By Mouth    . Turmeric (QC TUMERIC COMPLEX PO) Take by mouth.    . Zinc 50 MG TABS Take by mouth.    Marland Kitchen alfuzosin (UROXATRAL) 10 MG 24 hr tablet Take 1 tablet (10 mg  total) by mouth daily with breakfast. (Patient not taking: Reported on 09/02/2020) 90 tablet 1  . thiamine 50 MG tablet Take 1 tablet (50 mg total) by mouth daily. (Patient not taking: Reported on 09/02/2020) 90 tablet 1  . tiZANidine (ZANAFLEX) 2 MG tablet Take 1 tablet (2 mg total) by mouth every 6 (six) hours as needed for muscle spasms. (Patient not taking: Reported on 09/02/2020) 30 tablet 1  . traMADol (ULTRAM) 50 MG tablet Take 1 tablet (50 mg total) by mouth every 6 (six) hours as needed. (Patient not taking: Reported on 09/02/2020) 30 tablet 0   No  current facility-administered medications on file prior to visit.        ROS:  All others reviewed and negative.  Objective        PE:  BP 130/68   Pulse 63   Temp 98.4 F (36.9 C) (Oral)   Ht 5\' 8"  (1.727 m)   Wt 130 lb (59 kg)   SpO2 97%   BMI 19.77 kg/m                 Constitutional: Pt appears in NAD               HENT: Head: NCAT.                Right Ear: External ear normal.                 Left Ear: External ear normal.                Eyes: . Pupils are equal, round, and reactive to light. Conjunctivae and EOM are normal               Nose: without d/c or deformity               Neck: Neck supple. Gross normal ROM               Cardiovascular: Normal rate and regular rhythm.                 Pulmonary/Chest: Effort normal and breath sounds without rales or wheezing.                Abd:  Soft, NT, ND, + BS, no organomegaly               Neurological: Pt is alert. At baseline orientation, motor grossly intact               Skin: Skin is warm. No rashes, no other new lesions, LE edema - none               Psychiatric: Pt behavior is normal without agitation   Micro: none  Cardiac tracings I have personally interpreted today:  none  Pertinent Radiological findings (summarize): none   Lab Results  Component Value Date   WBC 4.4 08/28/2020   HGB 14.4 08/28/2020   HCT 43.0 08/28/2020   PLT 130.0 (L) 08/28/2020   GLUCOSE 94 08/28/2020   CHOL 153 08/28/2020   TRIG 60.0 08/28/2020   HDL 61.30 08/28/2020   LDLCALC 79 08/28/2020   ALT 13 08/28/2020   AST 16 08/28/2020   NA 139 08/28/2020   K 4.1 08/28/2020   CL 104 08/28/2020   CREATININE 0.93 08/28/2020   BUN 16 08/28/2020   CO2 30 08/28/2020   TSH 4.49 08/28/2020   HGBA1C 5.4 08/28/2020   Assessment/Plan:  Jason Barton is a  87 y.o. White or Caucasian [1] male with  has a past medical history of COPD (chronic obstructive pulmonary disease) ([place]) (05/21/2011), Diverticulosis of colon (05/21/2011),  Hypothyroidism, Impaired glucose tolerance (06/08/2012), Increased prostate specific antigen (PSA) velocity (05/24/2011), Nonmelanoma skin cancer (05/21/2011), S/P appendectomy (05/21/2011), S/P inguinal hernia repair (05/21/2011), S/p small bowel obstruction (05/21/2011), and Thoracic scoliosis (05/21/2011).  Preventative health care Age and sex appropriate education and counseling updated with regular exercise and diet Referrals for preventative services - none needed Immunizations addressed - none needed Smoking counseling  - none needed Evidence for depression or other mood disorder - none significant Most recent labs reviewed. I have personally reviewed and have noted: 1) the patient's medical and social history 2) The patient's current medications and supplements 3) The patient's height, weight, and BMI have been recorded in the chart   Followup: Return in about 6 months (around 03/05/2021).  Cathlean Cower, MD 09/07/2020 1:07 AM [facility]

## 2020-09-07 ENCOUNTER — Encounter: Payer: Self-pay | Admitting: Internal Medicine

## 2020-09-07 NOTE — Assessment & Plan Note (Signed)

## 2020-10-09 ENCOUNTER — Telehealth: Payer: Self-pay | Admitting: Internal Medicine

## 2020-10-09 ENCOUNTER — Other Ambulatory Visit: Payer: Self-pay

## 2020-10-09 ENCOUNTER — Encounter: Payer: Self-pay | Admitting: Internal Medicine

## 2020-10-09 ENCOUNTER — Ambulatory Visit: Payer: Medicare Other | Admitting: Internal Medicine

## 2020-10-09 ENCOUNTER — Ambulatory Visit (HOSPITAL_COMMUNITY)
Admission: RE | Admit: 2020-10-09 | Discharge: 2020-10-09 | Disposition: A | Discharge status: Home / Self Care | Payer: Medicare Other | Source: Ambulatory Visit | Attending: Cardiovascular Disease | Admitting: Cardiovascular Disease

## 2020-10-09 VITALS — BP 148/80 | HR 52 | Ht 68.0 in | Wt 128.0 lb

## 2020-10-09 DIAGNOSIS — G459 Transient cerebral ischemic attack, unspecified: Secondary | ICD-10-CM | POA: Diagnosis not present

## 2020-10-09 DIAGNOSIS — R001 Bradycardia, unspecified: Secondary | ICD-10-CM

## 2020-10-09 DIAGNOSIS — E7800 Pure hypercholesterolemia, unspecified: Secondary | ICD-10-CM

## 2020-10-09 DIAGNOSIS — R7302 Impaired glucose tolerance (oral): Secondary | ICD-10-CM | POA: Diagnosis not present

## 2020-10-09 MED ORDER — ROSUVASTATIN CALCIUM 10 MG PO TABS: 10.0000 mg | ORAL_TABLET | Freq: Every day | ORAL | 3 refills | Status: DC

## 2020-10-09 MED ORDER — ASPIRIN 81 MG PO TBEC: 81.0000 mg | DELAYED_RELEASE_TABLET | Freq: Every day | ORAL | 99 refills | Status: DC

## 2020-10-09 NOTE — Progress Notes (Addendum)
Patient ID: Jason Barton, male   DOB: [birth year], 87 y.o.   MRN: [id number]        Chief Complaint:  Balance problem and right occiput funny feeling last evening      HPI:  Jason Barton is a 87 y.o. male here with c/o last night waling from dinner to bedroom with onset funny feeling to the right occiput he wouldn't really call a pain but points there several times during this exam, assoc with unusual balance difficulty and had to hold on to the walls, no falls but was able to sit and all passed in 20 min, has not recurred since then;  Denies any other HA or focal neuro s/s.  Refused ED evaluation today.  Pt denies chest pain, increased sob or doe, wheezing, orthopnea, PND, increased LE swelling, palpitations, dizziness or syncope. Bp usuallly < 140/90, rushed to get here has Hx of melanoma x 4 with no known recurrence such as brain met, followed by Dr Ubaldo Glassing.  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   Pt denies polydipsia, polyuria,  Pt denies fever, night sweats, loss of appetite, or other constitutional symptoms  , though has lost wt a few lbs recently for unclear reasons.  New low heart rate is also noted today       Wt Readings from Last 3 Encounters:  10/09/20 128 lb (58.1 kg)  09/02/20 130 lb (59 kg)  06/04/20 131 lb (59.4 kg)   BP Readings from Last 3 Encounters:  10/09/20 (!) 148/80  09/02/20 130/68  06/04/20 100/80         Past Medical History:  Diagnosis Date  . COPD (chronic obstructive pulmonary disease) ([place]) 05/21/2011  . Diverticulosis of colon 05/21/2011  . Hypothyroidism   . Impaired glucose tolerance 06/08/2012  . Increased prostate specific antigen (PSA) velocity 05/24/2011  . Nonmelanoma skin cancer 05/21/2011  . S/P appendectomy 05/21/2011  . S/P inguinal hernia repair 05/21/2011  . S/p small bowel obstruction 05/21/2011  . Thoracic scoliosis 05/21/2011   Past Surgical History:  Procedure Laterality Date   . APPENDECTOMY  1939  . COLON RESECTION     2009 for a "kink in colon" adhesions after appendectomy  . SHOULDER SURGERY     dr sypher; rotater cuff  . STRABISMUS SURGERY Right 11/21/2014   Procedure: REPAIR STRABISMUS RIGHT EYE ;  Surgeon: Everitt Amber, MD;  Location: [facility];  Service: Ophthalmology;  Laterality: Right;  . TONSILLECTOMY  1942    reports that he has never smoked. He has never used smokeless tobacco. He reports previous alcohol use. He reports that he does not use drugs. family history includes Arthritis in an other family member; Sudden death in an other family member. Allergies  Allergen Reactions  . Augmentin [Amoxicillin-Pot Clavulanate] Rash    Itching rash to all torso 2 days after start augmentin 12/2017   Current Outpatient Medications on File Prior to Visit  Medication Sig Dispense Refill  . alfuzosin (UROXATRAL) 10 MG 24 hr tablet Take 1 tablet (10 mg total) by mouth daily with breakfast. 90 tablet 1  . Ascorbic Acid (VITAMIN C WITH ROSE HIPS) 500 MG tablet Take 500 mg by mouth daily.    . Cholecalciferol (VITAMIN D PO) Take 5,000 Units by mouth every morning.    Marland Kitchen COVID-19 mRNA vaccine, Pfizer, 30 MCG/0.3ML injection INJECT AS DIRECTED .3 mL 0  . Iron-FA-B Cmp-C-Biot-Probiotic (FUSION PLUS) CAPS Take  1 capsule by mouth daily. 90 capsule 1  . levothyroxine (SYNTHROID) 50 MCG tablet TAKE 1 TABLET BY MOUTH DAILY BEFORE BREAKFAST 90 tablet 3  . polyethylene glycol (MIRALAX / GLYCOLAX) 17 g packet Take 17 g by mouth daily.    Marland Kitchen selenium 50 MCG TABS tablet Take 50 mcg by mouth daily.    Marland Kitchen Specialty Vitamins Products (PROSTATE PO) Take by mouth daily.    Marland Kitchen thiamine (VITAMIN B-1) 100 MG tablet SMARTSIG:By Mouth    . thiamine 50 MG tablet Take 1 tablet (50 mg total) by mouth daily. 90 tablet 1  . tiZANidine (ZANAFLEX) 2 MG tablet Take 1 tablet (2 mg total) by mouth every 6 (six) hours as needed for muscle spasms. 30 tablet 1  . traMADol (ULTRAM) 50 MG  tablet Take 1 tablet (50 mg total) by mouth every 6 (six) hours as needed. 30 tablet 0  . Turmeric (QC TUMERIC COMPLEX PO) Take by mouth.    . Zinc 50 MG TABS Take by mouth.     No current facility-administered medications on file prior to visit.        ROS:  All others reviewed and negative.  Objective        PE:  BP (!) 148/80 (BP Location: Left Arm, Patient Position: Sitting, Cuff Size: Normal)   Pulse (!) 52   Ht 5' 8" (1.727 m)   Wt 128 lb (58.1 kg)   SpO2 97%   BMI 19.46 kg/m                 Constitutional: Pt appears in NAD               HENT: Head: NCAT.                Right Ear: External ear normal.                 Left Ear: External ear normal.                Eyes: . Pupils are equal, round, and reactive to light. Conjunctivae and EOM are normal               Nose: without d/c or deformity               Neck: Neck supple. Gross normal ROM               Cardiovascular: Normal rate and regular rhythm.                 Pulmonary/Chest: Effort normal and breath sounds without rales or wheezing.                Abd:  Soft, NT, ND, + BS, no organomegaly               Neurological: Pt is alert. At baseline orientation, motor grossly intact, cn 2-12 intact               Skin: Skin is warm. No rashes, no other new lesions, LE edema - none               Psychiatric: Pt behavior is normal without agitation   Micro: none  Cardiac tracings I have personally interpreted today:  ECG - sinus bradycardia 49  Pertinent Radiological findings (summarize): none   Lab Results  Component Value Date   WBC 4.4 08/28/2020   HGB 14.4 08/28/2020   HCT 43.0 08/28/2020   PLT 130.0 (L)  08/28/2020   GLUCOSE 94 08/28/2020   CHOL 153 08/28/2020   TRIG 60.0 08/28/2020   HDL 61.30 08/28/2020   LDLCALC 79 08/28/2020   ALT 13 08/28/2020   AST 16 08/28/2020   NA 139 08/28/2020   K 4.1 08/28/2020   CL 104 08/28/2020   CREATININE 0.93 08/28/2020   BUN 16 08/28/2020   CO2 30 08/28/2020   TSH  4.49 08/28/2020   HGBA1C 5.4 08/28/2020   Assessment/Plan:  Jason Barton is a 87 y.o. White or Caucasian [1] male with  has a past medical history of COPD (chronic obstructive pulmonary disease) ([place]) (05/21/2011), Diverticulosis of colon (05/21/2011), Hypothyroidism, Impaired glucose tolerance (06/08/2012), Increased prostate specific antigen (PSA) velocity (05/24/2011), Nonmelanoma skin cancer (05/21/2011), S/P appendectomy (05/21/2011), S/P inguinal hernia repair (05/21/2011), S/p small bowel obstruction (05/21/2011), and Thoracic scoliosis (05/21/2011).  TIA (transient ischemic attack) [place] r/o tia - for asa 81 qd to start, also for echo, carotids, brain mri, start low dose statin, and refer neurology  Bradycardia Unclear if related, for cardiac event monitor, refer cardiology  Impaired glucose tolerance Lab Results  Component Value Date   HGBA1C 5.4 08/28/2020   Stable, pt to continue current medical treatment  - diet   Hyperlipidemia Lab Results  Component Value Date   LDLCALC 79 08/28/2020   Stable, pt to start crestor 10   Followup: Return in about 4 weeks (around 11/06/2020).  Cathlean Cower, MD 10/11/2020 12:02 PM [facility]

## 2020-10-09 NOTE — Patient Instructions (Addendum)
Please take all new medication as prescribed - Aspirin 81 mg (enteric coated only) - 1 per day  Please take all new medication as prescribed - the crestor 10 mg per day  Please continue all other medications as before, and refills have been done if requested.  Please have the pharmacy call with any other refills you may need.  Please continue your efforts at being more active, low cholesterol diet, and weight control.  Please keep your appointments with your specialists as you may have planned  Your EKG was OK today  You will be contacted regarding the referral for: cardiac event monitor, Head MRI, carotid artery testing, echocardiogram, and Neurology and  Cardiology  Please make an Appointment to return in 1 month

## 2020-10-09 NOTE — Telephone Encounter (Signed)
Ok to forward to pcp's

## 2020-10-09 NOTE — Telephone Encounter (Signed)
Patient calling, states he would like to be referred to Dr. Stanford Breed with cardiology

## 2020-10-10 ENCOUNTER — Ambulatory Visit
Admission: RE | Admit: 2020-10-10 | Discharge: 2020-10-10 | Disposition: A | Discharge status: Home / Self Care | Payer: Medicare Other | Source: Ambulatory Visit | Attending: Internal Medicine | Admitting: Internal Medicine

## 2020-10-10 ENCOUNTER — Encounter: Payer: Self-pay | Admitting: Internal Medicine

## 2020-10-10 DIAGNOSIS — G459 Transient cerebral ischemic attack, unspecified: Secondary | ICD-10-CM

## 2020-10-11 ENCOUNTER — Encounter: Payer: Self-pay | Admitting: Internal Medicine

## 2020-10-11 DIAGNOSIS — R001 Bradycardia, unspecified: Secondary | ICD-10-CM | POA: Insufficient documentation

## 2020-10-11 DIAGNOSIS — Z8673 Personal history of transient ischemic attack (TIA), and cerebral infarction without residual deficits: Secondary | ICD-10-CM | POA: Insufficient documentation

## 2020-10-11 DIAGNOSIS — G459 Transient cerebral ischemic attack, unspecified: Secondary | ICD-10-CM | POA: Insufficient documentation

## 2020-10-11 NOTE — Assessment & Plan Note (Signed)
Lab Results  Component Value Date   LDLCALC 79 08/28/2020   Stable, pt to start crestor 10

## 2020-10-11 NOTE — Assessment & Plan Note (Signed)
[place] r/o tia - for asa 81 qd to start, also for echo, carotids, brain mri, start low dose statin, and refer neurology

## 2020-10-11 NOTE — Assessment & Plan Note (Signed)
Lab Results  Component Value Date   HGBA1C 5.4 08/28/2020   Stable, pt to continue current medical treatment  - diet

## 2020-10-11 NOTE — Assessment & Plan Note (Signed)
Unclear if related, for cardiac event monitor, refer cardiology

## 2020-10-12 ENCOUNTER — Encounter: Payer: Self-pay | Admitting: *Deleted

## 2020-10-12 NOTE — Progress Notes (Unsigned)
Enrolled for preventice to mail a 30 day event monitor.

## 2020-10-19 ENCOUNTER — Telehealth: Payer: Self-pay | Admitting: *Deleted

## 2020-10-19 NOTE — Telephone Encounter (Signed)
Patient states he received his monitor in the mail yesterday, but he is unsure how to apply it. He would like to know if someone can assist him with applying it tomorrow while he's at the office for his echo. Please advise.

## 2020-10-20 ENCOUNTER — Ambulatory Visit (HOSPITAL_COMMUNITY): Payer: Medicare Other | Attending: Cardiovascular Disease

## 2020-10-20 ENCOUNTER — Other Ambulatory Visit: Payer: Self-pay

## 2020-10-20 ENCOUNTER — Encounter: Payer: Self-pay | Admitting: Internal Medicine

## 2020-10-20 ENCOUNTER — Ambulatory Visit (INDEPENDENT_AMBULATORY_CARE_PROVIDER_SITE_OTHER): Payer: Medicare Other

## 2020-10-20 ENCOUNTER — Other Ambulatory Visit: Payer: Self-pay | Admitting: Internal Medicine

## 2020-10-20 DIAGNOSIS — G459 Transient cerebral ischemic attack, unspecified: Secondary | ICD-10-CM | POA: Insufficient documentation

## 2020-10-20 DIAGNOSIS — I4891 Unspecified atrial fibrillation: Secondary | ICD-10-CM | POA: Diagnosis not present

## 2020-10-20 LAB — ECHOCARDIOGRAM COMPLETE
Area-P 1/2: 1.3 cm2
S' Lateral: 3 cm

## 2020-10-20 NOTE — Telephone Encounter (Signed)
Spoke to the wife, DPR on file. Patient has already left for his appointment today. Advised wife if the patient did not bring his monitor with him to the appointment today and still needs help just to come by the office and we can assist him. Verbalized understanding.

## 2020-10-20 NOTE — Telephone Encounter (Signed)
Patients monitor applied 10/20/20 after his echocardiogram.

## 2020-10-27 ENCOUNTER — Telehealth: Payer: Self-pay | Admitting: *Deleted

## 2020-10-27 NOTE — Telephone Encounter (Signed)
New Message:    Please call, pt says he is having problems with his strips not connecting.

## 2020-10-27 NOTE — Telephone Encounter (Signed)
  Patient states he no longer needs a call from [place]. Issue has been resolved.

## 2020-11-06 ENCOUNTER — Telehealth: Payer: Self-pay | Admitting: Internal Medicine

## 2020-11-09 ENCOUNTER — Ambulatory Visit: Payer: Medicare Other | Admitting: Internal Medicine

## 2020-11-09 ENCOUNTER — Encounter: Payer: Self-pay | Admitting: Internal Medicine

## 2020-11-09 ENCOUNTER — Other Ambulatory Visit: Payer: Self-pay

## 2020-11-09 VITALS — BP 148/74 | HR 68 | Temp 98.0°F | Ht 68.0 in | Wt 128.0 lb

## 2020-11-09 DIAGNOSIS — I700 Atherosclerosis of aorta: Secondary | ICD-10-CM | POA: Diagnosis not present

## 2020-11-09 DIAGNOSIS — R030 Elevated blood-pressure reading, without diagnosis of hypertension: Secondary | ICD-10-CM

## 2020-11-09 DIAGNOSIS — R7302 Impaired glucose tolerance (oral): Secondary | ICD-10-CM

## 2020-11-09 DIAGNOSIS — G459 Transient cerebral ischemic attack, unspecified: Secondary | ICD-10-CM

## 2020-11-09 DIAGNOSIS — E7800 Pure hypercholesterolemia, unspecified: Secondary | ICD-10-CM

## 2020-11-09 LAB — HEMOGLOBIN A1C: Hgb A1c MFr Bld: 5.5 % (ref 4.6–6.5)

## 2020-11-09 NOTE — Progress Notes (Signed)
Patient ID: Jason Barton, male   DOB: June 10, 1926, 87 y.o.   MRN: [id number]        Chief Complaint:  Fatigue and recent TIA with risk factors       HPI:  Jason Barton is a 87 y.o. male here with family overall doing well. Pt denies chest pain, increased sob or doe, wheezing, orthopnea, PND, increased LE swelling, palpitations, dizziness or syncope.   Pt denies polydipsia, polyuria, or new neuro focal s/s.   Pt denies fever, wt loss, night sweats, loss of appetite, or other constitutional symptoms   Trying to follow cardiac diet.  Does c/o ongoing fatigue, but denies signficant daytime hypersomnolence.  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  Is currently wearing cardiac monitor due to finish June 1.         Wt Readings from Last 3 Encounters:  11/09/20 128 lb (58.1 kg)  10/09/20 128 lb (58.1 kg)  09/02/20 130 lb (59 kg)   BP Readings from Last 3 Encounters:  11/09/20 (!) 148/74  10/09/20 (!) 148/80  09/02/20 130/68         Past Medical History:  Diagnosis Date  . COPD (chronic obstructive pulmonary disease) ([place]) 05/21/2011  . Diverticulosis of colon 05/21/2011  . Hypothyroidism   . Impaired glucose tolerance 06/08/2012  . Increased prostate specific antigen (PSA) velocity 05/24/2011  . Nonmelanoma skin cancer 05/21/2011  . S/P appendectomy 05/21/2011  . S/P inguinal hernia repair 05/21/2011  . S/p small bowel obstruction 05/21/2011  . Thoracic scoliosis 05/21/2011   Past Surgical History:  Procedure Laterality Date  . APPENDECTOMY  1939  . COLON RESECTION     2009 for a "kink in colon" adhesions after appendectomy  . SHOULDER SURGERY     dr sypher; rotater cuff  . STRABISMUS SURGERY Right 11/21/2014   Procedure: REPAIR STRABISMUS RIGHT EYE ;  Surgeon: Everitt Amber, MD;  Location: [facility];  Service: Ophthalmology;  Laterality: Right;  . TONSILLECTOMY  1942    reports that he has never smoked. He has never used smokeless tobacco. He  reports previous alcohol use. He reports that he does not use drugs. family history includes Arthritis in an other family member; Sudden death in an other family member. Allergies  Allergen Reactions  . Augmentin [Amoxicillin-Pot Clavulanate] Rash    Itching rash to all torso 2 days after start augmentin 12/2017   Current Outpatient Medications on File Prior to Visit  Medication Sig Dispense Refill  . alfuzosin (UROXATRAL) 10 MG 24 hr tablet Take 1 tablet (10 mg total) by mouth daily with breakfast. 90 tablet 1  . Ascorbic Acid (VITAMIN C WITH ROSE HIPS) 500 MG tablet Take 500 mg by mouth daily.    Marland Kitchen aspirin (SB LOW DOSE ASA EC) 81 MG EC tablet Take 1 tablet (81 mg total) by mouth daily. Swallow whole. 100 tablet 99  . Cholecalciferol (VITAMIN D PO) Take 5,000 Units by mouth every morning.    . Iron-FA-B Cmp-C-Biot-Probiotic (FUSION PLUS) CAPS Take 1 capsule by mouth daily. 90 capsule 1  . levothyroxine (SYNTHROID) 50 MCG tablet TAKE 1 TABLET BY MOUTH DAILY BEFORE BREAKFAST 90 tablet 3  . polyethylene glycol (MIRALAX / GLYCOLAX) 17 g packet Take 17 g by mouth daily.    . rosuvastatin (CRESTOR) 10 MG tablet Take 1 tablet (10 mg total) by mouth daily. 90 tablet 3  . selenium 50 MCG TABS tablet Take 50 mcg by mouth daily.    Marland Kitchen  Specialty Vitamins Products (PROSTATE PO) Take by mouth daily.    Marland Kitchen thiamine (VITAMIN B-1) 100 MG tablet SMARTSIG:By Mouth    . thiamine 50 MG tablet Take 1 tablet (50 mg total) by mouth daily. 90 tablet 1  . Turmeric (QC TUMERIC COMPLEX PO) Take by mouth.    . Zinc 50 MG TABS Take by mouth.    Marland Kitchen tiZANidine (ZANAFLEX) 2 MG tablet Take 1 tablet (2 mg total) by mouth every 6 (six) hours as needed for muscle spasms. (Patient not taking: Reported on 11/09/2020) 30 tablet 1  . traMADol (ULTRAM) 50 MG tablet Take 1 tablet (50 mg total) by mouth every 6 (six) hours as needed. (Patient not taking: Reported on 11/09/2020) 30 tablet 0   No current facility-administered medications on  file prior to visit.        ROS:  All others reviewed and negative.  Objective        PE:  BP (!) 148/74 (BP Location: Right Arm, Patient Position: Sitting, Cuff Size: Normal)   Pulse 68   Temp 98 F (36.7 C) (Oral)   Ht 5\' 8"  (1.727 m)   Wt 128 lb (58.1 kg)   SpO2 98%   BMI 19.46 kg/m                 Constitutional: Pt appears in NAD               HENT: Head: NCAT.                Right Ear: External ear normal.                 Left Ear: External ear normal.                Eyes: . Pupils are equal, round, and reactive to light. Conjunctivae and EOM are normal               Nose: without d/c or deformity               Neck: Neck supple. Gross normal ROM               Cardiovascular: Normal rate and regular rhythm.                 Pulmonary/Chest: Effort normal and breath sounds without rales or wheezing.                Abd:  Soft, NT, ND, + BS, no organomegaly               Neurological: Pt is alert. At baseline orientation, motor grossly intact               Skin: Skin is warm. No rashes, no other new lesions, LE edema - none               Psychiatric: Pt behavior is normal without agitation   Micro: none  Cardiac tracings I have personally interpreted today:  none  Pertinent Radiological findings (summarize): 10/10/20 Head MRI IMPRESSION: 1. No acute intracranial abnormality. 2. Mild chronic small vessel ischemic disease with chronic lacunar infarcts as above.  4 22 2023 Carotid u/s - essentially near normal bilateral  5 3 2022 Echo  - EF 55-60%, gr 1 diast dysfxn, and aortic dilation 41 mm    Lab Results  Component Value Date   WBC 4.4 08/28/2020   HGB 14.4 08/28/2020   HCT 43.0 08/28/2020   PLT  130.0 (L) 08/28/2020   GLUCOSE 75 11/09/2020   CHOL 132 11/09/2020   TRIG 55.0 11/09/2020   HDL 66.80 11/09/2020   LDLCALC 54 11/09/2020   ALT 12 11/09/2020   AST 17 11/09/2020   NA 141 11/09/2020   K 4.3 11/09/2020   CL 105 11/09/2020   CREATININE 0.95  11/09/2020   BUN 19 11/09/2020   CO2 25 11/09/2020   TSH 4.49 08/28/2020   HGBA1C 5.5 11/09/2020   Assessment/Plan:  MARC LEICHTER is a 87 y.o. White or Caucasian [1] male with  has a past medical history of COPD (chronic obstructive pulmonary disease) ([place]) (05/21/2011), Diverticulosis of colon (05/21/2011), Hypothyroidism, Impaired glucose tolerance (06/08/2012), Increased prostate specific antigen (PSA) velocity (05/24/2011), Nonmelanoma skin cancer (05/21/2011), S/P appendectomy (05/21/2011), S/P inguinal hernia repair (05/21/2011), S/p small bowel obstruction (05/21/2011), and Thoracic scoliosis (05/21/2011).  Hyperlipidemia Goal ldl < 70,  Lab Results  Component Value Date   LDLCALC 54 11/09/2020   Stable, pt to continue current statin crestor 10   Impaired glucose tolerance Lab Results  Component Value Date   HGBA1C 5.5 11/09/2020   Stable, pt to continue current medical treatment  - diet   Aortic atherosclerosis ([place]) To continue crestor, diet, excercise  Blood pressure elevated without history of HTN Mild elevated again today, but declines med change for now, will continue to monitor at home and next visit  TIA (transient ischemic attack) Stable, to continue asa, statin and f/u cardiac event monitor  Followup: Return in about 6 months (around 05/12/2021).  Cathlean Cower, MD 11/16/2020 11:47 AM [facility]

## 2020-11-09 NOTE — Patient Instructions (Signed)
Please continue all other medications as before, and refills have been done if requested.  Please have the pharmacy call with any other refills you may need.  Please continue your efforts at being more active, low cholesterol diet, and weight control  Please keep your appointments with your specialists as you may have planned - neurology in July 2022  Please go to the LAB at the blood drawing area for the tests to be done  You will be contacted by phone if any changes need to be made immediately.  Otherwise, you will receive a letter about your results with an explanation, but please check with MyChart first.  Please remember to sign up for MyChart if you have not done so, as this will be important to you in the future with finding out test results, communicating by private email, and scheduling acute appointments online when needed.  Please make an Appointment to return in 6 months, or sooner if needed

## 2020-11-10 ENCOUNTER — Encounter: Payer: Self-pay | Admitting: Internal Medicine

## 2020-11-10 LAB — BASIC METABOLIC PANEL
BUN: 19 mg/dL (ref 6–23)
CO2: 25 mEq/L (ref 19–32)
Calcium: 9 mg/dL (ref 8.4–10.5)
Chloride: 105 mEq/L (ref 96–112)
Creatinine, Ser: 0.95 mg/dL (ref 0.40–1.50)
GFR: 69.45 mL/min (ref >60.00)
Glucose, Bld: 75 mg/dL (ref 70–99)
Potassium: 4.3 mEq/L (ref 3.5–5.1)
Sodium: 141 mEq/L (ref 135–145)

## 2020-11-10 LAB — HEPATIC FUNCTION PANEL
ALT: 12 U/L (ref 0–53)
AST: 17 U/L (ref 0–37)
Albumin: 4.2 g/dL (ref 3.5–5.2)
Alkaline Phosphatase: 63 U/L (ref 39–117)
Bilirubin, Direct: 0.1 mg/dL (ref 0.0–0.3)
Total Bilirubin: 0.7 mg/dL (ref 0.2–1.2)
Total Protein: 6.6 g/dL (ref 6.0–8.3)

## 2020-11-10 LAB — LIPID PANEL
Cholesterol: 132 mg/dL (ref 0–200)
HDL: 66.8 mg/dL (ref >39.00)
LDL Cholesterol: 54 mg/dL (ref 0–99)
NonHDL: 64.91
Total CHOL/HDL Ratio: 2
Triglycerides: 55 mg/dL (ref 0.0–149.0)
VLDL: 11 mg/dL (ref 0.0–40.0)

## 2020-11-16 ENCOUNTER — Encounter: Payer: Self-pay | Admitting: Internal Medicine

## 2020-11-16 DIAGNOSIS — R030 Elevated blood-pressure reading, without diagnosis of hypertension: Secondary | ICD-10-CM | POA: Insufficient documentation

## 2020-11-16 NOTE — Assessment & Plan Note (Signed)
To continue crestor, diet, excercise

## 2020-11-16 NOTE — Assessment & Plan Note (Signed)
Goal ldl < 70,  Lab Results  Component Value Date   LDLCALC 54 11/09/2020   Stable, pt to continue current statin crestor 10

## 2020-11-16 NOTE — Assessment & Plan Note (Addendum)
Mild elevated again today, but declines med change for now, will continue to monitor at home and next visit

## 2020-11-16 NOTE — Assessment & Plan Note (Signed)
Lab Results  °Component Value Date  ° HGBA1C 5.5 11/09/2020  ° °Stable, pt to continue current medical treatment  - diet ° °

## 2020-11-16 NOTE — Assessment & Plan Note (Signed)
Stable, to continue asa, statin and f/u cardiac event monitor

## 2020-12-01 ENCOUNTER — Telehealth: Payer: Self-pay | Admitting: Internal Medicine

## 2020-12-01 ENCOUNTER — Encounter: Payer: Self-pay | Admitting: Internal Medicine

## 2020-12-01 NOTE — Telephone Encounter (Signed)
MAKE IN ERROR

## 2020-12-03 ENCOUNTER — Ambulatory Visit (INDEPENDENT_AMBULATORY_CARE_PROVIDER_SITE_OTHER): Payer: Medicare Other

## 2020-12-03 DIAGNOSIS — Z0000 Encounter for general adult medical examination without abnormal findings: Secondary | ICD-10-CM

## 2020-12-03 NOTE — Patient Instructions (Signed)
[name] , Thank you for taking time to come for your Medicare Wellness Visit. I appreciate your ongoing commitment to your health goals. Please review the following plan we discussed and let me know if I can assist you in the future.   Screening recommendations/referrals: Colonoscopy: no repeat due to age Recommended yearly ophthalmology/optometry visit for glaucoma screening and checkup Recommended yearly dental visit for hygiene and checkup  Vaccinations: Influenza vaccine: declined Pneumococcal vaccine: 06/11/2013, 08/17/2017 Tdap vaccine: declined Shingles vaccine: never done   Covid-19: 07/26/2019, 08/20/2019, 04/15/2020  Advanced directives: Please bring a copy of your health care power of attorney and living will to the office at your convenience.  Conditions/risks identified: My goal is to maintain my current health status by continuing to eat healthy and stay physically active & socially active.  Next appointment: Please schedule your next Medicare Wellness Visit with your Nurse Health Advisor in 1 year by calling [phone].  Preventive Care 85 Years and Older, Male Preventive care refers to lifestyle choices and visits with your health care provider that can promote health and wellness. What does preventive care include? A yearly physical exam. This is also called an annual well check. Dental exams once or twice a year. Routine eye exams. Ask your health care provider how often you should have your eyes checked. Personal lifestyle choices, including: Daily care of your teeth and gums. Regular physical activity. Eating a healthy diet. Avoiding tobacco and drug use. Limiting alcohol use. Practicing safe sex. Taking low doses of aspirin every day. Taking vitamin and mineral supplements as recommended by your health care provider. What happens during an annual well check? The services and screenings done by your health care provider during your annual well check will  depend on your age, overall health, lifestyle risk factors, and family history of disease. Counseling  Your health care provider may ask you questions about your: Alcohol use. Tobacco use. Drug use. Emotional well-being. Home and relationship well-being. Sexual activity. Eating habits. History of falls. Memory and ability to understand (cognition). Work and work Statistician. Screening  You may have the following tests or measurements: Height, weight, and BMI. Blood pressure. Lipid and cholesterol levels. These may be checked every 5 years, or more frequently if you are over 85 years old. Skin check. Lung cancer screening. You may have this screening every year starting at age 15 if you have a 30-pack-year history of smoking and currently smoke or have quit within the past 15 years. Fecal occult blood test (FOBT) of the stool. You may have this test every year starting at age 47. Flexible sigmoidoscopy or colonoscopy. You may have a sigmoidoscopy every 5 years or a colonoscopy every 10 years starting at age 85. Prostate cancer screening. Recommendations will vary depending on your family history and other risks. Hepatitis C blood test. Hepatitis B blood test. Sexually transmitted disease (STD) testing. Diabetes screening. This is done by checking your blood sugar (glucose) after you have not eaten for a while (fasting). You may have this done every 1-3 years. Abdominal aortic aneurysm (AAA) screening. You may need this if you are a current or former smoker. Osteoporosis. You may be screened starting at age 51 if you are at high risk. Talk with your health care provider about your test results, treatment options, and if necessary, the need for more tests. Vaccines  Your health care provider may recommend certain vaccines, such as: Influenza vaccine. This is recommended every year. Tetanus, diphtheria, and acellular pertussis (Tdap, Td)  vaccine. You may need a Td booster every 10  years. Zoster vaccine. You may need this after age 85. Pneumococcal 13-valent conjugate (PCV13) vaccine. One dose is recommended after age 85. Pneumococcal polysaccharide (PPSV23) vaccine. One dose is recommended after age 85. Talk to your health care provider about which screenings and vaccines you need and how often you need them. This information is not intended to replace advice given to you by your health care provider. Make sure you discuss any questions you have with your health care provider. Document Released: 07/03/2015 Document Revised: 02/24/2016 Document Reviewed: 04/07/2015 Elsevier Interactive Patient Education  2017 [place] Prevention in the Home Falls can cause injuries. They can happen to people of all ages. There are many things you can do to make your home safe and to help prevent falls. What can I do on the outside of my home? Regularly fix the edges of walkways and driveways and fix any cracks. Remove anything that might make you trip as you walk through a door, such as a raised step or threshold. Trim any bushes or trees on the path to your home. Use bright outdoor lighting. Clear any walking paths of anything that might make someone trip, such as rocks or tools. Regularly check to see if handrails are loose or broken. Make sure that both sides of any steps have handrails. Any raised decks and porches should have guardrails on the edges. Have any leaves, snow, or ice cleared regularly. Use sand or salt on walking paths during winter. Clean up any spills in your garage right away. This includes oil or grease spills. What can I do in the bathroom? Use night lights. Install grab bars by the toilet and in the tub and shower. Do not use towel bars as grab bars. Use non-skid mats or decals in the tub or shower. If you need to sit down in the shower, use a plastic, non-slip stool. Keep the floor dry. Clean up any water that spills on the floor as soon as it  happens. Remove soap buildup in the tub or shower regularly. Attach bath mats securely with double-sided non-slip rug tape. Do not have throw rugs and other things on the floor that can make you trip. What can I do in the bedroom? Use night lights. Make sure that you have a light by your bed that is easy to reach. Do not use any sheets or blankets that are too big for your bed. They should not hang down onto the floor. Have a firm chair that has side arms. You can use this for support while you get dressed. Do not have throw rugs and other things on the floor that can make you trip. What can I do in the kitchen? Clean up any spills right away. Avoid walking on wet floors. Keep items that you use a lot in easy-to-reach places. If you need to reach something above you, use a strong step stool that has a grab bar. Keep electrical cords out of the way. Do not use floor polish or wax that makes floors slippery. If you must use wax, use non-skid floor wax. Do not have throw rugs and other things on the floor that can make you trip. What can I do with my stairs? Do not leave any items on the stairs. Make sure that there are handrails on both sides of the stairs and use them. Fix handrails that are broken or loose. Make sure that handrails are as long  as the stairways. Check any carpeting to make sure that it is firmly attached to the stairs. Fix any carpet that is loose or worn. Avoid having throw rugs at the top or bottom of the stairs. If you do have throw rugs, attach them to the floor with carpet tape. Make sure that you have a light switch at the top of the stairs and the bottom of the stairs. If you do not have them, ask someone to add them for you. What else can I do to help prevent falls? Wear shoes that: Do not have high heels. Have rubber bottoms. Are comfortable and fit you well. Are closed at the toe. Do not wear sandals. If you use a stepladder: Make sure that it is fully opened.  Do not climb a closed stepladder. Make sure that both sides of the stepladder are locked into place. Ask someone to hold it for you, if possible. Clearly mark and make sure that you can see: Any grab bars or handrails. First and last steps. Where the edge of each step is. Use tools that help you move around (mobility aids) if they are needed. These include: Canes. Walkers. Scooters. Crutches. Turn on the lights when you go into a dark area. Replace any light bulbs as soon as they burn out. Set up your furniture so you have a clear path. Avoid moving your furniture around. If any of your floors are uneven, fix them. If there are any pets around you, be aware of where they are. Review your medicines with your doctor. Some medicines can make you feel dizzy. This can increase your chance of falling. Ask your doctor what other things that you can do to help prevent falls. This information is not intended to replace advice given to you by your health care provider. Make sure you discuss any questions you have with your health care provider. Document Released: 04/02/2009 Document Revised: 11/12/2015 Document Reviewed: 07/11/2014 Elsevier Interactive Patient Education  2017 [employer].

## 2020-12-03 NOTE — Progress Notes (Signed)
I connected with Jason Barton today by telephone and verified that I am speaking with the correct person using two identifiers. Location patient: home Location provider: work Persons participating in the virtual visit: Jason Barton and [employer], LPN.   I discussed the limitations, risks, security and privacy concerns of performing an evaluation and management service by telephone and the availability of in person appointments. I also discussed with the patient that there may be a patient responsible charge related to this service. The patient expressed understanding and verbally consented to this telephonic visit.    Interactive audio and video telecommunications were attempted between this provider and patient, however failed, due to patient having technical difficulties OR patient did not have access to video capability.  We continued and completed visit with audio only.  Some vital signs may be absent or patient reported.   Time Spent with patient on telephone encounter: 30 minutes  Subjective:   Jason Barton is a 87 y.o. male who presents for Medicare Annual/Subsequent preventive examination.  Review of Systems     Cardiac Risk Factors include: advanced age (>31men, >103 women);dyslipidemia;family history of premature cardiovascular disease;male gender     Objective:    There were no vitals filed for this visit. There is no height or weight on file to calculate BMI.  Advanced Directives 12/03/2020 02/07/2020 11/01/2019 08/16/2016 11/21/2014 11/20/2014  Does Patient Have a Medical Advance Directive? Yes Yes Yes Yes Yes Yes  Type of Advance Directive Living will;Healthcare Power of [place];Living will [place];Living will Living will - -  Does patient want to make changes to medical advance directive? No - Guardian declined Yes (ED - Information included in AVS) No - Patient declined - - -  Copy of [place] in Chart? No - copy requested - No - copy requested - No - copy requested -    Current Medications (verified) Outpatient Encounter Medications as of 12/03/2020  Medication Sig   alfuzosin (UROXATRAL) 10 MG 24 hr tablet Take 1 tablet (10 mg total) by mouth daily with breakfast.   Ascorbic Acid (VITAMIN C WITH ROSE HIPS) 500 MG tablet Take 500 mg by mouth daily.   aspirin (SB LOW DOSE ASA EC) 81 MG EC tablet Take 1 tablet (81 mg total) by mouth daily. Swallow whole.   Cholecalciferol (VITAMIN D PO) Take 5,000 Units by mouth every morning.   Iron-FA-B Cmp-C-Biot-Probiotic (FUSION PLUS) CAPS Take 1 capsule by mouth daily.   levothyroxine (SYNTHROID) 50 MCG tablet TAKE 1 TABLET BY MOUTH DAILY BEFORE BREAKFAST   polyethylene glycol (MIRALAX / GLYCOLAX) 17 g packet Take 17 g by mouth daily.   rosuvastatin (CRESTOR) 10 MG tablet Take 1 tablet (10 mg total) by mouth daily.   selenium 50 MCG TABS tablet Take 50 mcg by mouth daily.   Specialty Vitamins Products (PROSTATE PO) Take by mouth daily.   thiamine (VITAMIN B-1) 100 MG tablet SMARTSIG:By Mouth   thiamine 50 MG tablet Take 1 tablet (50 mg total) by mouth daily.   tiZANidine (ZANAFLEX) 2 MG tablet Take 1 tablet (2 mg total) by mouth every 6 (six) hours as needed for muscle spasms. (Patient not taking: Reported on 11/09/2020)   traMADol (ULTRAM) 50 MG tablet Take 1 tablet (50 mg total) by mouth every 6 (six) hours as needed. (Patient not taking: Reported on 11/09/2020)   Turmeric (QC TUMERIC COMPLEX PO) Take by mouth.   Zinc 50 MG TABS Take by  mouth.   No facility-administered encounter medications on file as of 12/03/2020.    Allergies (verified) Augmentin [amoxicillin-pot clavulanate]   History: Past Medical History:  Diagnosis Date   COPD (chronic obstructive pulmonary disease) ([place]) 05/21/2011   Diverticulosis of colon 05/21/2011   Hypothyroidism    Impaired glucose tolerance 06/08/2012   Increased prostate specific antigen (PSA)  velocity 05/24/2011   Nonmelanoma skin cancer 05/21/2011   S/P appendectomy 05/21/2011   S/P inguinal hernia repair 05/21/2011   S/p small bowel obstruction 05/21/2011   Thoracic scoliosis 05/21/2011   Past Surgical History:  Procedure Laterality Date   APPENDECTOMY  1939   COLON RESECTION     2009 for a "kink in colon" adhesions after appendectomy   SHOULDER SURGERY     dr sypher; rotater cuff   STRABISMUS SURGERY Right 11/21/2014   Procedure: REPAIR STRABISMUS RIGHT EYE ;  Surgeon: Everitt Amber, MD;  Location: [facility];  Service: Ophthalmology;  Laterality: Right;   TONSILLECTOMY  1942   Family History  Problem Relation Age of Onset   Arthritis Other    Sudden death Other    Colon cancer Neg Hx    Esophageal cancer Neg Hx    Rectal cancer Neg Hx    Social History   Socioeconomic History   Marital status: Married    Spouse name: Not on file   Number of children: 4   Years of education: MBA   Highest education level: Not on file  Occupational History   Occupation: retired  Tobacco Use   Smoking status: Never   Smokeless tobacco: Never  Vaping Use   Vaping Use: Never used  Substance and Sexual Activity   Alcohol use: Not Currently   Drug use: No   Sexual activity: Not on file  Other Topics Concern   Not on file  Social History Narrative   Not on file   Social Determinants of Health   Financial Resource Strain: Low Risk    Difficulty of Paying Living Expenses: Not hard at all  Food Insecurity: No Food Insecurity   Worried About Charity fundraiser in the Last Year: Never true   [place] in the Last Year: Never true  Transportation Needs: No Transportation Needs   Lack of Transportation (Medical): No   Lack of Transportation (Non-Medical): No  Physical Activity: Sufficiently Active   Days of Exercise per Week: 5 days   Minutes of Exercise per Session: 30 min  Stress: No Stress Concern Present   Feeling of Stress : Not at all  Social  Connections: Socially Integrated   Frequency of Communication with Friends and Family: More than three times a week   Frequency of Social Gatherings with Friends and Family: More than three times a week   Attends Religious Services: More than 4 times per year   Active Member of [employer] or Organizations: Yes   Attends Music therapist: More than 4 times per year   Marital Status: Married    Tobacco Counseling Counseling given: Not Answered   Clinical Intake:  Pre-visit preparation completed: Yes  Pain : No/denies pain     Nutritional Risks: Other (Comment) Diabetes: No  How often do you need to have someone help you when you read instructions, pamphlets, or other written materials from your doctor or pharmacy?: 1 - Never What is the last grade level you completed in school?: Master's in Business Administration  Diabetic? no  Interpreter Needed?: No  Information  entered by :: Lisette Abu, LPN   Activities of Daily Living In your present state of health, do you have any difficulty performing the following activities: 12/03/2020 11/09/2020  Hearing? N N  Vision? N N  Difficulty concentrating or making decisions? N Y  Walking or climbing stairs? N N  Dressing or bathing? N N  Doing errands, shopping? N Y  Conservation officer, nature and eating ? N -  Using the Toilet? N -  In the past six months, have you accidently leaked urine? N -  Do you have problems with loss of bowel control? N -  Managing your Medications? N -  Managing your Finances? N -  Housekeeping or managing your Housekeeping? N -  Some recent data might be hidden    Patient Care Team: Biagio Borg, MD as PCP - General (Internal Medicine)  Indicate any recent Medical Services you may have received from other than [facility] providers in the past year (date may be approximate).     Assessment:   This is a routine wellness examination for Juli[place].  Hearing/Vision screen No results found.  Dietary  issues and exercise activities discussed: Current Exercise Habits: Home exercise routine, Type of exercise: walking, Time (Minutes): 30, Frequency (Times/Week): 5, Weekly Exercise (Minutes/Week): 150, Intensity: Mild, Exercise limited by: respiratory conditions(s);cardiac condition(s)   Goals Addressed             This Visit's Progress    Patient Stated   On track    To maintain my current health status by continuing to eat healthy and stay physically active & socially active.       Depression Screen PHQ 2/9 Scores 12/03/2020 11/09/2020 11/01/2019 08/22/2019 06/10/2019 08/21/2018 08/17/2017  PHQ - 2 Score 0 0 0 0 0 0 0  PHQ- 9 Score - - - - - - 0    Fall Risk Fall Risk  12/03/2020 11/09/2020 11/01/2019 08/22/2019 06/10/2019  Falls in the past year? 0 0 1 0 0  Number falls in past yr: 0 0 0 - 0  Injury with Fall? 0 0 0 - 0  Risk for fall due to : No Fall Risks - No Fall Risks - -  Follow up Falls evaluation completed - Falls evaluation completed;Falls prevention discussed - -    FALL RISK PREVENTION PERTAINING TO THE HOME:  Any stairs in or around the home? Yes  If so, are there any without handrails? No  Home free of loose throw rugs in walkways, pet beds, electrical cords, etc? Yes  Adequate lighting in your home to reduce risk of falls? Yes   ASSISTIVE DEVICES UTILIZED TO PREVENT FALLS:  Life alert? No  Use of a cane, walker or w/c? No  Grab bars in the bathroom? No  Shower chair or bench in shower? No  Elevated toilet seat or a handicapped toilet? No   TIMED UP AND GO:  Was the test performed? No .  Length of time to ambulate 10 feet: 0 sec.   Gait steady and fast without use of assistive device  Cognitive Function: No flowsheet data found.     6CIT Screen 11/01/2019  What Year? 0 points  What month? 0 points  What time? 0 points  Count back from 20 0 points  Months in reverse 0 points  Repeat phrase 0 points  Total Score 0    Immunizations Immunization History   Administered Date(s) Administered   Influenza, Seasonal, Injecte, Preservative Fre 07/17/2012   PFIZER(Purple Top)SARS-COV-2 Vaccination 07/26/2019,  08/20/2019, 04/15/2020   Pneumococcal Conjugate-13 06/11/2013   Pneumococcal Polysaccharide-23 08/17/2017   Zoster, Live 07/24/2014    TDAP status: Due, Education has been provided regarding the importance of this vaccine. Advised may receive this vaccine at local pharmacy or Health Dept. Aware to provide a copy of the vaccination record if obtained from local pharmacy or Health Dept. Verbalized acceptance and understanding.  Flu Vaccine status: Declined, Education has been provided regarding the importance of this vaccine but patient still declined. Advised may receive this vaccine at local pharmacy or Health Dept. Aware to provide a copy of the vaccination record if obtained from local pharmacy or Health Dept. Verbalized acceptance and understanding.  Pneumococcal vaccine status: Up to date  Covid-19 vaccine status: Completed vaccines  Qualifies for Shingles Vaccine? Yes   Zostavax completed Yes   Shingrix Completed?: No.    Education has been provided regarding the importance of this vaccine. Patient has been advised to call insurance company to determine out of pocket expense if they have not yet received this vaccine. Advised may also receive vaccine at local pharmacy or Health Dept. Verbalized acceptance and understanding.  Screening Tests Health Maintenance  Topic Date Due   Zoster Vaccines- Shingrix (1 of 2) Never done   COVID-19 Vaccine (4 - Booster for Pfizer series) 07/16/2020   TETANUS/TDAP  08/16/2026   PNA vac Low Risk Adult  Completed   HPV VACCINES  Aged Out   INFLUENZA VACCINE  Discontinued    Health Maintenance  Health Maintenance Due  Topic Date Due   Zoster Vaccines- Shingrix (1 of 2) Never done   COVID-19 Vaccine (4 - Booster for Pfizer series) 07/16/2020    Colorectal cancer screening: No longer required.    Lung Cancer Screening: (Low Dose CT Chest recommended if Age 19-80 years, 30 pack-year currently smoking OR have quit w/in 15years.) does not qualify.   Lung Cancer Screening Referral: no  Additional Screening:  Hepatitis C Screening: does not qualify; Completed no  Vision Screening: Recommended annual ophthalmology exams for early detection of glaucoma and other disorders of the eye. Is the patient up to date with their annual eye exam?  Yes  Who is the provider or what is the name of the office in which the patient attends annual eye exams? Luberta Mutter, MD. If pt is not established with a provider, would they like to be referred to a provider to establish care? No .   Dental Screening: Recommended annual dental exams for proper oral hygiene  Community Resource Referral / Chronic Care Management: CRR required this visit?  No   CCM required this visit?  No      Plan:     I have personally reviewed and noted the following in the patient's chart:   Medical and social history Use of alcohol, tobacco or illicit drugs  Current medications and supplements including opioid prescriptions. Patient is not currently taking opioid prescriptions. Functional ability and status Nutritional status Physical activity Advanced directives List of other physicians Hospitalizations, surgeries, and ER visits in previous 12 months Vitals Screenings to include cognitive, depression, and falls Referrals and appointments  In addition, I have reviewed and discussed with patient certain preventive protocols, quality metrics, and best practice recommendations. A written personalized care plan for preventive services as well as general preventive health recommendations were provided to patient.     Sheral Flow, LPN   [id number]   Nurse Notes:  Patient is cogitatively intact. There were no vitals filed for  this visit. There is no height or weight on file to calculate BMI. Patient  stated that he has no issues with gait or balance; does not use any assistive devices.

## 2020-12-18 ENCOUNTER — Ambulatory Visit (INDEPENDENT_AMBULATORY_CARE_PROVIDER_SITE_OTHER): Payer: Medicare Other | Admitting: Internal Medicine

## 2020-12-18 ENCOUNTER — Other Ambulatory Visit: Payer: Self-pay

## 2020-12-18 ENCOUNTER — Encounter: Payer: Self-pay | Admitting: Internal Medicine

## 2020-12-18 DIAGNOSIS — R7302 Impaired glucose tolerance (oral): Secondary | ICD-10-CM | POA: Diagnosis not present

## 2020-12-18 DIAGNOSIS — R030 Elevated blood-pressure reading, without diagnosis of hypertension: Secondary | ICD-10-CM

## 2020-12-18 DIAGNOSIS — K4090 Unilateral inguinal hernia, without obstruction or gangrene, not specified as recurrent: Secondary | ICD-10-CM

## 2020-12-18 NOTE — Progress Notes (Signed)
Patient ID: Jason Barton, male   DOB: 1926/06/07, 87 y.o.   MRN: [id number]        Chief Complaint: follow up left inguinal area swelling       HPI:  Jason Barton is a 87 y.o. male here with above, after remembering lifting something heavy a month ago, now noticed a kind of swelling in the shower, not sure how long it has been there, denies pain, and Denies worsening reflux, abd pain, dysphagia, n/v, bowel change or blood.  Denies urinary symptoms such as dysuria, frequency, urgency, flank pain, hematuria or n/v, fever, chills.  Pt denies chest pain, increased sob or doe, wheezing, orthopnea, PND, increased LE swelling, palpitations, dizziness or syncope.    Pt denies polydipsia, polyuria, or new focal neuro s/s.  .          Wt Readings from Last 3 Encounters:  12/18/20 128 lb 3.2 oz (58.2 kg)  11/09/20 128 lb (58.1 kg)  10/09/20 128 lb (58.1 kg)   BP Readings from Last 3 Encounters:  12/18/20 124/88  11/09/20 (!) 148/74  10/09/20 (!) 148/80         Past Medical History:  Diagnosis Date   COPD (chronic obstructive pulmonary disease) ([place]) 05/21/2011   Diverticulosis of colon 05/21/2011   Hypothyroidism    Impaired glucose tolerance 06/08/2012   Increased prostate specific antigen (PSA) velocity 05/24/2011   Nonmelanoma skin cancer 05/21/2011   S/P appendectomy 05/21/2011   S/P inguinal hernia repair 05/21/2011   S/p small bowel obstruction 05/21/2011   Thoracic scoliosis 05/21/2011   Past Surgical History:  Procedure Laterality Date   APPENDECTOMY  1939   COLON RESECTION     2009 for a "kink in colon" adhesions after appendectomy   SHOULDER SURGERY     dr sypher; rotater cuff   STRABISMUS SURGERY Right 11/21/2014   Procedure: REPAIR STRABISMUS RIGHT EYE ;  Surgeon: Everitt Amber, MD;  Location: [place];  Service: Ophthalmology;  Laterality: Right;   TONSILLECTOMY  1942    reports that he has never smoked. He has never used smokeless tobacco. He reports  previous alcohol use. He reports that he does not use drugs. family history includes Arthritis in an other family member; Sudden death in an other family member. Allergies  Allergen Reactions   Augmentin [Amoxicillin-Pot Clavulanate] Rash    Itching rash to all torso 2 days after start augmentin 12/2017   Current Outpatient Medications on File Prior to Visit  Medication Sig Dispense Refill   alfuzosin (UROXATRAL) 10 MG 24 hr tablet Take 1 tablet (10 mg total) by mouth daily with breakfast. 90 tablet 1   Ascorbic Acid (VITAMIN C WITH ROSE HIPS) 500 MG tablet Take 500 mg by mouth daily.     aspirin (SB LOW DOSE ASA EC) 81 MG EC tablet Take 1 tablet (81 mg total) by mouth daily. Swallow whole. 100 tablet 99   Cholecalciferol (VITAMIN D PO) Take 5,000 Units by mouth every morning.     Iron-FA-B Cmp-C-Biot-Probiotic (FUSION PLUS) CAPS Take 1 capsule by mouth daily. 90 capsule 1   levothyroxine (SYNTHROID) 50 MCG tablet TAKE 1 TABLET BY MOUTH DAILY BEFORE BREAKFAST 90 tablet 3   polyethylene glycol (MIRALAX / GLYCOLAX) 17 g packet Take 17 g by mouth daily.     rosuvastatin (CRESTOR) 10 MG tablet Take 1 tablet (10 mg total) by mouth daily. 90 tablet 3   selenium 50 MCG TABS tablet Take 50 mcg by mouth  daily.     Specialty Vitamins Products (PROSTATE PO) Take by mouth daily.     thiamine (VITAMIN B-1) 100 MG tablet SMARTSIG:By Mouth     thiamine 50 MG tablet Take 1 tablet (50 mg total) by mouth daily. 90 tablet 1   Turmeric (QC TUMERIC COMPLEX PO) Take by mouth.     Zinc 50 MG TABS Take by mouth.     tiZANidine (ZANAFLEX) 2 MG tablet Take 1 tablet (2 mg total) by mouth every 6 (six) hours as needed for muscle spasms. (Patient not taking: Reported on 12/18/2020) 30 tablet 1   traMADol (ULTRAM) 50 MG tablet Take 1 tablet (50 mg total) by mouth every 6 (six) hours as needed. (Patient not taking: Reported on 12/18/2020) 30 tablet 0   No current facility-administered medications on file prior to visit.         ROS:  All others reviewed and negative.  Objective        PE:  BP 124/88 (BP Location: Right Arm, Patient Position: Sitting, Cuff Size: Normal)   Pulse 68   Temp 97.6 F (36.4 C) (Oral)   Ht 5\' 8"  (1.727 m)   Wt 128 lb 3.2 oz (58.2 kg)   SpO2 96%   BMI 19.49 kg/m                 Constitutional: Pt appears in NAD               HENT: Head: NCAT.                Right Ear: External ear normal.                 Left Ear: External ear normal.                Eyes: . Pupils are equal, round, and reactive to light. Conjunctivae and EOM are normal               Nose: without d/c or deformity               Neck: Neck supple. Gross normal ROM               Cardiovascular: Normal rate and regular rhythm.                 Pulmonary/Chest: Effort normal and breath sounds without rales or wheezing.                Abd:  Soft, NT, ND, + BS, no organomegaly; + left inguinal area reducible LIH nontender               Neurological: Pt is alert. At baseline orientation, motor grossly intact               Skin: Skin is warm. No rashes, no other new lesions, LE edema - none               Psychiatric: Pt behavior is normal without agitation   Micro: none  Cardiac tracings I have personally interpreted today:  none  Pertinent Radiological findings (summarize): none   Lab Results  Component Value Date   WBC 4.4 08/28/2020   HGB 14.4 08/28/2020   HCT 43.0 08/28/2020   PLT 130.0 (L) 08/28/2020   GLUCOSE 75 11/09/2020   CHOL 132 11/09/2020   TRIG 55.0 11/09/2020   HDL 66.80 11/09/2020   LDLCALC 54 11/09/2020   ALT 12 11/09/2020   AST 17 11/09/2020  NA 141 11/09/2020   K 4.3 11/09/2020   CL 105 11/09/2020   CREATININE 0.95 11/09/2020   BUN 19 11/09/2020   CO2 25 11/09/2020   TSH 4.49 08/28/2020   HGBA1C 5.5 11/09/2020   Assessment/Plan:  Jason Barton is a 87 y.o. White or Caucasian [1] male with  has a past medical history of COPD (chronic obstructive pulmonary disease) ([place])  (05/21/2011), Diverticulosis of colon (05/21/2011), Hypothyroidism, Impaired glucose tolerance (06/08/2012), Increased prostate specific antigen (PSA) velocity (05/24/2011), Nonmelanoma skin cancer (05/21/2011), S/P appendectomy (05/21/2011), S/P inguinal hernia repair (05/21/2011), S/p small bowel obstruction (05/21/2011), and Thoracic scoliosis (05/21/2011).  Left inguinal hernia nontender and reducible, will continue to monitor as elective surgury at his advanced age is not advised  Impaired glucose tolerance Lab Results  Component Value Date   HGBA1C 5.5 11/09/2020   Stable, pt to continue current medical treatment  - diet   Blood pressure elevated without history of HTN BP Readings from Last 3 Encounters:  12/18/20 124/88  11/09/20 (!) 148/74  10/09/20 (!) 148/80   Much improved today, cont current tx (no med) and monitor bp at home and next visit  Followup: Return if symptoms worsen or fail to improve.  Cathlean Cower, MD 12/19/2020 4:34 PM [facility]

## 2020-12-19 ENCOUNTER — Encounter: Payer: Self-pay | Admitting: Internal Medicine

## 2020-12-19 DIAGNOSIS — K4090 Unilateral inguinal hernia, without obstruction or gangrene, not specified as recurrent: Secondary | ICD-10-CM | POA: Insufficient documentation

## 2020-12-19 NOTE — Assessment & Plan Note (Signed)
nontender and reducible, will continue to monitor as elective surgury at his advanced age is not advised

## 2020-12-19 NOTE — Patient Instructions (Signed)
Please continue all other medications as before, and refills have been done if requested.  Please have the pharmacy call with any other refills you may need.  Please continue your efforts at being more active, low cholesterol diet, and weight control.  Please keep your appointments with your specialists as you may have planned     

## 2020-12-19 NOTE — Assessment & Plan Note (Signed)
Lab Results  °Component Value Date  ° HGBA1C 5.5 11/09/2020  ° °Stable, pt to continue current medical treatment  - diet ° °

## 2020-12-19 NOTE — Assessment & Plan Note (Signed)
BP Readings from Last 3 Encounters:  12/18/20 124/88  11/09/20 (!) 148/74  10/09/20 (!) 148/80   Much improved today, cont current tx (no med) and monitor bp at home and next visit

## 2020-12-20 NOTE — Progress Notes (Signed)
Jason Gibney, MD Reason for referral-bradycardia  HPI: 87 year old male for evaluation of bradycardia at request of Cathlean Cower, MD.  Seen with possible TIA April 2022.  Also noted to have bradycardia with heart rate of 49.  Carotid Dopplers April 2022 showed near normal bilaterally.  Echocardiogram May 2022 showed normal LV function, grade 1 diastolic dysfunction, mild aortic insufficiency, mildly dilated aortic root at 41 mm.  Monitor June 2022 showed sinus rhythm with PACs, brief PAT, PVCs; lowest heart rate 38 at 5:54 AM.  No symptoms reported.  Cardiology now asked to evaluate.  Patient states that in April he was in his house walking in felt a "pull" to his left transiently.  He denies weakness in his extremities, dysarthria.  He sat down and after 10 minutes his symptoms completely resolved.  No symptoms since.  He otherwise denies dyspnea, chest pain, palpitations or syncope.  Cardiology asked to evaluate for bradycardia noted on monitor.  Current Outpatient Medications  Medication Sig Dispense Refill   alfuzosin (UROXATRAL) 10 MG 24 hr tablet Take 1 tablet (10 mg total) by mouth daily with breakfast. 90 tablet 1   Ascorbic Acid (VITAMIN C WITH ROSE HIPS) 500 MG tablet Take 500 mg by mouth daily.     aspirin (SB LOW DOSE ASA EC) 81 MG EC tablet Take 1 tablet (81 mg total) by mouth daily. Swallow whole. 100 tablet 99   Cholecalciferol (VITAMIN D PO) Take 5,000 Units by mouth every morning.     Iron-FA-B Cmp-C-Biot-Probiotic (FUSION PLUS) CAPS Take 1 capsule by mouth daily. 90 capsule 1   levothyroxine (SYNTHROID) 50 MCG tablet TAKE 1 TABLET BY MOUTH DAILY BEFORE BREAKFAST 90 tablet 3   polyethylene glycol (MIRALAX / GLYCOLAX) 17 g packet Take 17 g by mouth daily.     rosuvastatin (CRESTOR) 10 MG tablet Take 1 tablet (10 mg total) by mouth daily. 90 tablet 3   selenium 50 MCG TABS tablet Take 50 mcg by mouth daily.     Specialty Vitamins Products (PROSTATE PO) Take by mouth daily.      thiamine (VITAMIN B-1) 100 MG tablet SMARTSIG:By Mouth     thiamine 50 MG tablet Take 1 tablet (50 mg total) by mouth daily. 90 tablet 1   Zinc 50 MG TABS Take by mouth.     No current facility-administered medications for this visit.    Allergies  Allergen Reactions   Augmentin [Amoxicillin-Pot Clavulanate] Rash    Itching rash to all torso 2 days after start augmentin 12/2017     Past Medical History:  Diagnosis Date   COPD (chronic obstructive pulmonary disease) ([place]) 05/21/2011   Diverticulosis of colon 05/21/2011   Hypothyroidism    Impaired glucose tolerance 06/08/2012   Increased prostate specific antigen (PSA) velocity 05/24/2011   Nonmelanoma skin cancer 05/21/2011   S/P appendectomy 05/21/2011   S/P inguinal hernia repair 05/21/2011   S/p small bowel obstruction 05/21/2011   Thoracic scoliosis 05/21/2011    Past Surgical History:  Procedure Laterality Date   APPENDECTOMY  1939   COLON RESECTION     2009 for a "kink in colon" adhesions after appendectomy   SHOULDER SURGERY     dr sypher; rotater cuff   STRABISMUS SURGERY Right 11/21/2014   Procedure: REPAIR STRABISMUS RIGHT EYE ;  Surgeon: Everitt Amber, MD;  Location: [place];  Service: Ophthalmology;  Laterality: Right;   TONSILLECTOMY  1942    Social History   Socioeconomic History   Marital  status: Married    Spouse name: Not on file   Number of children: 4   Years of education: MBA   Highest education level: Not on file  Occupational History   Occupation: retired  Tobacco Use   Smoking status: Never   Smokeless tobacco: Never  Vaping Use   Vaping Use: Never used  Substance and Sexual Activity   Alcohol use: Yes    Comment: Rare   Drug use: No   Sexual activity: Not on file  Other Topics Concern   Not on file  Social History Narrative   Not on file   Social Determinants of Health   Financial Resource Strain: Low Risk    Difficulty of Paying Living Expenses: Not hard at all   Food Insecurity: No Food Insecurity   Worried About Charity fundraiser in the Last Year: Never true   [place] in the Last Year: Never true  Transportation Needs: No Transportation Needs   Lack of Transportation (Medical): No   Lack of Transportation (Non-Medical): No  Physical Activity: Sufficiently Active   Days of Exercise per Week: 5 days   Minutes of Exercise per Session: 30 min  Stress: No Stress Concern Present   Feeling of Stress : Not at all  Social Connections: Socially Integrated   Frequency of Communication with Friends and Family: More than three times a week   Frequency of Social Gatherings with Friends and Family: More than three times a week   Attends Religious Services: More than 4 times per year   Active Member of [employer] or Organizations: Yes   Attends Music therapist: More than 4 times per year   Marital Status: Married  Human resources officer Violence: Not At Risk   Fear of Current or Ex-Partner: No   Emotionally Abused: No   Physically Abused: No   Sexually Abused: No    Family History  Problem Relation Age of Onset   Arthritis Other    Sudden death Other    Colon cancer Neg Hx    Esophageal cancer Neg Hx    Rectal cancer Neg Hx     ROS: no fevers or chills, productive cough, hemoptysis, dysphasia, odynophagia, melena, hematochezia, dysuria, hematuria, rash, seizure activity, orthopnea, PND, pedal edema, claudication. Remaining systems are negative.  Physical Exam:   Blood pressure (!) 150/80, pulse 64, height 5\' 8"  (1.727 m), weight 128 lb 12.8 oz (58.4 kg).  General:  Well developed/well nourished in NAD Skin warm/dry Patient not depressed No peripheral clubbing Back-normal HEENT-normal/normal eyelids Neck supple/normal carotid upstroke bilaterally; no bruits; no JVD; no thyromegaly chest - CTA/ normal expansion CV - irregular/normal S1 and S2; no murmurs, rubs or gallops;  PMI nondisplaced Abdomen -NT/ND, no HSM, no mass, +  bowel sounds, no bruit 2+ femoral pulses, no bruits Ext-no edema, chords, 2+ DP Neuro-grossly nonfocal  ECG -October 09, 2020-sinus bradycardia at a rate of 49 with no ST changes and no other conduction abnormalities noted.  Personally reviewed Electrocardiogram today shows sinus rhythm at a rate of 64, occasional PAC and PVC, nonspecific ST changes.  Personally reviewed.  A/P  1 bradycardia-patient noted to have sinus bradycardia but is asymptomatic.  He has not had prolonged pauses on his monitor and there is no history of syncope.  Electrocardiogram shows no other conduction abnormalities.  Note LV function is normal.  No indication for further therapy.  Also of note TSH August 28, 2020 4.49.  2 history of aortic atherosclerosis  noted on CT scan-continue statin.  3 question TIA-etiology of event that happened in April is unclear.  No atrial fibrillation noted on monitor.  He is scheduled to see neurology later this week.  If they deem this was a TIA then it would be reasonable to consider implantable loop monitor to make sure that he does not have atrial fibrillation.  4 hyperlipidemia-continue statin.  Kirk Ruths, MD

## 2020-12-28 ENCOUNTER — Encounter: Payer: Self-pay | Admitting: Cardiology

## 2020-12-28 ENCOUNTER — Ambulatory Visit: Payer: Medicare Other | Admitting: Cardiology

## 2020-12-28 ENCOUNTER — Other Ambulatory Visit: Payer: Self-pay

## 2020-12-28 VITALS — BP 150/80 | HR 64 | Ht 68.0 in | Wt 128.8 lb

## 2020-12-28 DIAGNOSIS — E7800 Pure hypercholesterolemia, unspecified: Secondary | ICD-10-CM | POA: Diagnosis not present

## 2020-12-28 DIAGNOSIS — G459 Transient cerebral ischemic attack, unspecified: Secondary | ICD-10-CM

## 2020-12-28 DIAGNOSIS — R001 Bradycardia, unspecified: Secondary | ICD-10-CM | POA: Diagnosis not present

## 2020-12-28 NOTE — Patient Instructions (Signed)

## 2020-12-29 ENCOUNTER — Telehealth: Payer: Self-pay | Admitting: Internal Medicine

## 2020-12-29 ENCOUNTER — Telehealth: Payer: Self-pay | Admitting: Cardiology

## 2020-12-29 NOTE — Telephone Encounter (Signed)
New Message:    Please  have Jason Barton to call this pt please. He need to discuss some things with her.

## 2020-12-29 NOTE — Telephone Encounter (Signed)
Patient  was able to get monitor results from Cardiology  No call back needed

## 2020-12-29 NOTE — Telephone Encounter (Signed)
   Patient is requesting a call back in regards heart monitor results. He said that he has an appointment with [facility] Neurology on Thursday. He can be reached at [phone]

## 2020-12-31 ENCOUNTER — Ambulatory Visit: Payer: Medicare Other | Admitting: Neurology

## 2020-12-31 ENCOUNTER — Encounter: Payer: Self-pay | Admitting: Neurology

## 2020-12-31 VITALS — BP 161/97 | HR 68 | Ht 68.0 in | Wt 129.5 lb

## 2020-12-31 DIAGNOSIS — R270 Ataxia, unspecified: Secondary | ICD-10-CM | POA: Diagnosis not present

## 2020-12-31 DIAGNOSIS — G459 Transient cerebral ischemic attack, unspecified: Secondary | ICD-10-CM

## 2020-12-31 NOTE — Patient Instructions (Signed)
I had a long d/w patient and his daughter about his recent episode of gait ataxia likely a posterior circulation TIA risk for recurrent stroke/TIAs, personally independently reviewed imaging studies and stroke evaluation results and answered questions.Continue aspirin 81 mg daily  for secondary stroke prevention and maintain strict control of hypertension with blood pressure goal below 130/90, diabetes with hemoglobin A1c goal below 6.5% and lipids with LDL cholesterol goal below 70 mg/dL.  Check CT angiogram of brain and neck to look for occlusive vertebrobasilar disease.  I also advised the patient to eat a healthy diet with plenty of whole grains, cereals, fruits and vegetables, exercise regularly and maintain ideal body weight Followup in the future with me in 3 months or call earlier if needed.

## 2020-12-31 NOTE — Progress Notes (Signed)
[facility] [address] [place]. [place] [address] [phone]       OFFICE CONSULT NOTE  Mr. KORBAN SHEARER Date of Birth:  [birth year] Medical Record Number:  [id number]   Referring MD:  Cathlean Cower  Reason for Referral:  TIA  HPI: Jason Barton is a 87 year old pleasant Caucasian male seen today for initial office consultation visit for TIA.  History is obtained from the patient and daughter as well as review of electronic medical records and I personally reviewed available imaging films in PACS.  He has past medical history of hypothyroidism, COPD, diverticulosis, scoliosis.  On 10/08/2020 we developed sudden onset of gait imbalance and a feeling of being pulled to the left while walking from his den towards the kitchen.  He was able to hold onto the kitchen counter after waiting a few minutes he was able to walk but felt he was slightly off balance.  He made it to the bedroom and lay down.  He felt slightly odd and different for about 30 minutes.  He denied any accompanying headache, blurred vision, slurred speech, double vision, vertigo, focal extremity weakness or numbness.  He denies any prior history of strokes or TIAs.  He subsequently had MRI scan of the brain done on 10/10/2020 which showed no acute abnormality.  Age-related changes of chronic small vessel disease and mild generalized atrophy were noted.  Carotid ultrasound was done as an outpatient and patient was told results were fine but I do not have the actual report.  Echocardiogram on 10/20/2020 showed ejection fraction of 60 to 65%.  Lab work on 11/09/2020 showed hemoglobin A1c of 5.5 and LDL cholesterol 54 mg percent.  Patient was started on aspirin 81 mg daily tolerating well with minor bruising and no bleeding.  He lives alone and is fairly independent with actives of daily living.  He is just sole caregiver for his wife who has Alzheimer's.  Patient has no new complaints today.  His family history of stroke  the patient has not himself had previous TIA or stroke  ROS:   14 system review of systems is positive for dizziness, gait imbalance , easy bruising and being pulled to 1 side and all other systems negative  PMH:  Past Medical History:  Diagnosis Date   COPD (chronic obstructive pulmonary disease) ([place]) 05/21/2011   Diverticulosis of colon 05/21/2011   Hypothyroidism    Impaired glucose tolerance 06/08/2012   Increased prostate specific antigen (PSA) velocity 05/24/2011   Nonmelanoma skin cancer 05/21/2011   S/P appendectomy 05/21/2011   S/P inguinal hernia repair 05/21/2011   S/p small bowel obstruction 05/21/2011   Thoracic scoliosis 05/21/2011    Social History:  Social History   Socioeconomic History   Marital status: Married    Spouse name: June   Number of children: 4   Years of education: MBA   Highest education level: Not on file  Occupational History   Occupation: retired  Tobacco Use   Smoking status: Never   Smokeless tobacco: Never  Vaping Use   Vaping Use: Never used  Substance and Sexual Activity   Alcohol use: Yes    Comment: Rare   Drug use: No   Sexual activity: Not on file  Other Topics Concern   Not on file  Social History Narrative   Lives at home with wife   Right Handed   Drinks 1-2 cups caffeine daily   Social Determinants of Health   Financial Resource Strain: Low Risk  Difficulty of Paying Living Expenses: Not hard at all  Food Insecurity: No Food Insecurity   Worried About [place] in the Last Year: Never true   Ran Out of Food in the Last Year: Never true  Transportation Needs: No Transportation Needs   Lack of Transportation (Medical): No   Lack of Transportation (Non-Medical): No  Physical Activity: Sufficiently Active   Days of Exercise per Week: 5 days   Minutes of Exercise per Session: 30 min  Stress: No Stress Concern Present   Feeling of Stress : Not at all  Social Connections: Socially Integrated   Frequency of  Communication with Friends and Family: More than three times a week   Frequency of Social Gatherings with Friends and Family: More than three times a week   Attends Religious Services: More than 4 times per year   Active Member of [employer] or Organizations: Yes   Attends Music therapist: More than 4 times per year   Marital Status: Married  Human resources officer Violence: Not At Risk   Fear of Current or Ex-Partner: No   Emotionally Abused: No   Physically Abused: No   Sexually Abused: No    Medications:   Current Outpatient Medications on File Prior to Visit  Medication Sig Dispense Refill   alfuzosin (UROXATRAL) 10 MG 24 hr tablet Take 1 tablet (10 mg total) by mouth daily with breakfast. 90 tablet 1   Ascorbic Acid (VITAMIN C WITH ROSE HIPS) 500 MG tablet Take 500 mg by mouth daily.     aspirin (SB LOW DOSE ASA EC) 81 MG EC tablet Take 1 tablet (81 mg total) by mouth daily. Swallow whole. 100 tablet 99   Cholecalciferol (VITAMIN D PO) Take 5,000 Units by mouth every morning.     Iron-FA-B Cmp-C-Biot-Probiotic (FUSION PLUS) CAPS Take 1 capsule by mouth daily. 90 capsule 1   levothyroxine (SYNTHROID) 50 MCG tablet TAKE 1 TABLET BY MOUTH DAILY BEFORE BREAKFAST 90 tablet 3   polyethylene glycol (MIRALAX / GLYCOLAX) 17 g packet Take 17 g by mouth daily.     rosuvastatin (CRESTOR) 10 MG tablet Take 1 tablet (10 mg total) by mouth daily. 90 tablet 3   selenium 50 MCG TABS tablet Take 50 mcg by mouth daily.     Specialty Vitamins Products (PROSTATE PO) Take by mouth daily.     thiamine (VITAMIN B-1) 100 MG tablet SMARTSIG:By Mouth     thiamine 50 MG tablet Take 1 tablet (50 mg total) by mouth daily. 90 tablet 1   Zinc 50 MG TABS Take by mouth.     No current facility-administered medications on file prior to visit.    Allergies:   Allergies  Allergen Reactions   Augmentin [Amoxicillin-Pot Clavulanate] Rash    Itching rash to all torso 2 days after start augmentin 12/2017     Physical Exam General: Frail malnourished looking elderly Caucasian male, seated, in no evident distress Head: head normocephalic and atraumatic.   Neck: supple with no carotid or supraclavicular bruits Cardiovascular: regular rate and rhythm, soft ejection systolic murmur.   Musculoskeletal: Severe kyphoscoliosis.   Skin:  no rash/petichiae Vascular:  Normal pulses all extremities  Neurologic Exam Mental Status: Awake and fully alert. Oriented to place and time. Recent and remote memory intact. Attention span, concentration and fund of knowledge appropriate. Mood and affect appropriate.  Cranial Nerves: Fundoscopic exam reveals sharp disc margins. Pupils equal, briskly reactive to light. Extraocular movements full without nystagmus. Visual fields  full to confrontation. Hearing intact. Facial sensation intact. Face, tongue, palate moves normally and symmetrically.  Motor: Normal bulk and tone. Normal strength in all tested extremity muscles. Sensory.: intact to touch , pinprick , position and vibratory sensation.  Coordination: Rapid alternating movements normal in all extremities. Finger-to-nose and heel-to-shin performed accurately bilaterally. Gait and Station: Arises from chair without difficulty. Stance is normal. Gait demonstrates normal stride length and balance . Able to heel, toe and tandem walk with moderate difficulty.  Reflexes: 1+ and symmetric. Toes downgoing.   NIHSS  0 Modified Rankin  0   ASSESSMENT: 87 year old Caucasian male with transient episode of gait imbalance and being pulled to 1 side likely of posterior circulation TIA from small vessel disease.  Vascular risk factors of hyperlipidemia, hypertension and age     PLAN: I had a long d/w patient and his daughter about his recent episode of gait ataxia likely a posterior circulation TIA risk for recurrent stroke/TIAs, personally independently reviewed imaging studies and stroke evaluation results and answered  questions.Continue aspirin 81 mg daily  for secondary stroke prevention and maintain strict control of hypertension with blood pressure goal below 130/90, diabetes with hemoglobin A1c goal below 6.5% and lipids with LDL cholesterol goal below 70 mg/dL.  Check CT angiogram of brain and neck to look for occlusive vertebrobasilar disease.  I also advised the patient to eat a healthy diet with plenty of whole grains, cereals, fruits and vegetables, exercise regularly and maintain ideal body weight Followup in the future with me in 3 months or call earlier if needed.I have spent a total of 45 minutes with the patient reviewing hospital notes,  test results, labs and examining the patient as well as establishing an assessment and plan that was discussed personally with the patient.  > 50% of time was spent in direct patient care.      Antony Contras, MD Note: This document was prepared with digital dictation and possible smart phrase technology. Any transcriptional errors that result from this process are unintentional.

## 2021-03-08 ENCOUNTER — Ambulatory Visit: Payer: Medicare Other | Admitting: Internal Medicine

## 2021-03-11 ENCOUNTER — Other Ambulatory Visit: Payer: Self-pay

## 2021-03-11 ENCOUNTER — Other Ambulatory Visit: Payer: Self-pay | Admitting: Internal Medicine

## 2021-03-11 NOTE — Telephone Encounter (Signed)
Please refill as per office routine med refill policy (all routine meds to be refilled for 3 mo or monthly (per pt preference) up to one year from last visit, then month to month grace period for 3 mo, then further med refills will have to be denied) ? ?

## 2021-03-12 ENCOUNTER — Encounter: Payer: Self-pay | Admitting: Internal Medicine

## 2021-03-12 ENCOUNTER — Ambulatory Visit: Payer: Medicare Other | Admitting: Internal Medicine

## 2021-03-12 VITALS — BP 100/70 | HR 71 | Temp 98.1°F | Ht 68.0 in | Wt 133.0 lb

## 2021-03-12 DIAGNOSIS — E7800 Pure hypercholesterolemia, unspecified: Secondary | ICD-10-CM | POA: Diagnosis not present

## 2021-03-12 DIAGNOSIS — R030 Elevated blood-pressure reading, without diagnosis of hypertension: Secondary | ICD-10-CM

## 2021-03-12 DIAGNOSIS — R7302 Impaired glucose tolerance (oral): Secondary | ICD-10-CM | POA: Diagnosis not present

## 2021-03-12 DIAGNOSIS — E039 Hypothyroidism, unspecified: Secondary | ICD-10-CM | POA: Diagnosis not present

## 2021-03-12 DIAGNOSIS — K4090 Unilateral inguinal hernia, without obstruction or gangrene, not specified as recurrent: Secondary | ICD-10-CM

## 2021-03-12 LAB — LIPID PANEL
Cholesterol: 114 mg/dL (ref 0–200)
HDL: 60.8 mg/dL
LDL Cholesterol: 41 mg/dL (ref 0–99)
NonHDL: 52.75
Total CHOL/HDL Ratio: 2
Triglycerides: 59 mg/dL (ref 0.0–149.0)
VLDL: 11.8 mg/dL (ref 0.0–40.0)

## 2021-03-12 LAB — HEPATIC FUNCTION PANEL
ALT: 11 U/L (ref 0–53)
AST: 14 U/L (ref 0–37)
Albumin: 4 g/dL (ref 3.5–5.2)
Alkaline Phosphatase: 74 U/L (ref 39–117)
Bilirubin, Direct: 0.2 mg/dL (ref 0.0–0.3)
Total Bilirubin: 0.8 mg/dL (ref 0.2–1.2)
Total Protein: 6.3 g/dL (ref 6.0–8.3)

## 2021-03-12 LAB — BASIC METABOLIC PANEL WITH GFR
BUN: 17 mg/dL (ref 6–23)
CO2: 30 meq/L (ref 19–32)
Calcium: 9 mg/dL (ref 8.4–10.5)
Chloride: 105 meq/L (ref 96–112)
Creatinine, Ser: 0.87 mg/dL (ref 0.40–1.50)
GFR: 74.7 mL/min
Glucose, Bld: 94 mg/dL (ref 70–99)
Potassium: 4.3 meq/L (ref 3.5–5.1)
Sodium: 141 meq/L (ref 135–145)

## 2021-03-12 LAB — TSH: TSH: 3.49 u[IU]/mL (ref 0.35–5.50)

## 2021-03-12 LAB — HEMOGLOBIN A1C: Hgb A1c MFr Bld: 5.5 % (ref 4.6–6.5)

## 2021-03-12 LAB — T4, FREE: Free T4: 0.87 ng/dL (ref 0.60–1.60)

## 2021-03-12 NOTE — Progress Notes (Signed)
Patient ID: Jason Barton, male   DOB: 1926/01/21, 87 y.o.   MRN: [id number]        Chief Complaint: follow up HTN, HLD and hyperglycemia, low thyroid, left inguinal hernia       HPI:  Jason Barton is a 87 y.o. male here overall doing wel, Pt denies chest pain, increased sob or doe, wheezing, orthopnea, PND, increased LE swelling, palpitations, dizziness or syncope.   Pt denies polydipsia, polyuria, or new focal neuro s/s.  Denies hyper or hypo thyroid symptoms such as voice, skin or hair change.  Has known left inguinal hernia, no pain or symtpoms, just concerned about it.  Trying to follow lower chol diet.  BP has been higher at home and asympt.   Pt denies fever, wt loss, night sweats, loss of appetite, or other constitutional symptom   No new complaints           Wt Readings from Last 3 Encounters:  03/12/21 133 lb (60.3 kg)  12/31/20 129 lb 8 oz (58.7 kg)  12/28/20 128 lb 12.8 oz (58.4 kg)   BP Readings from Last 3 Encounters:  03/12/21 100/70  12/31/20 (!) 161/97  12/28/20 (!) 150/80         Past Medical History:  Diagnosis Date   COPD (chronic obstructive pulmonary disease) ([place]) 05/21/2011   Diverticulosis of colon 05/21/2011   Hypothyroidism    Impaired glucose tolerance 06/08/2012   Increased prostate specific antigen (PSA) velocity 05/24/2011   Nonmelanoma skin cancer 05/21/2011   S/P appendectomy 05/21/2011   S/P inguinal hernia repair 05/21/2011   S/p small bowel obstruction 05/21/2011   Thoracic scoliosis 05/21/2011   Past Surgical History:  Procedure Laterality Date   APPENDECTOMY  1939   COLON RESECTION     2009 for a "kink in colon" adhesions after appendectomy   SHOULDER SURGERY     dr sypher; rotater cuff   STRABISMUS SURGERY Right 11/21/2014   Procedure: REPAIR STRABISMUS RIGHT EYE ;  Surgeon: Everitt Amber, MD;  Location: [facility];  Service: Ophthalmology;  Laterality: Right;   TONSILLECTOMY  1942    reports that he has never smoked. He  has never used smokeless tobacco. He reports current alcohol use. He reports that he does not use drugs. family history includes Arthritis in an other family member; Sudden death in an other family member. Allergies  Allergen Reactions   Augmentin [Amoxicillin-Pot Clavulanate] Rash    Itching rash to all torso 2 days after start augmentin 12/2017   Current Outpatient Medications on File Prior to Visit  Medication Sig Dispense Refill   alfuzosin (UROXATRAL) 10 MG 24 hr tablet Take 1 tablet (10 mg total) by mouth daily with breakfast. 90 tablet 1   Ascorbic Acid (VITAMIN C WITH ROSE HIPS) 500 MG tablet Take 500 mg by mouth daily.     aspirin (SB LOW DOSE ASA EC) 81 MG EC tablet Take 1 tablet (81 mg total) by mouth daily. Swallow whole. 100 tablet 99   Cholecalciferol (VITAMIN D PO) Take 5,000 Units by mouth every morning.     Iron-FA-B Cmp-C-Biot-Probiotic (FUSION PLUS) CAPS Take 1 capsule by mouth daily. 90 capsule 1   polyethylene glycol (MIRALAX / GLYCOLAX) 17 g packet Take 17 g by mouth daily.     rosuvastatin (CRESTOR) 10 MG tablet Take 1 tablet (10 mg total) by mouth daily. 90 tablet 3   selenium 50 MCG TABS tablet Take 50 mcg by mouth daily.  Specialty Vitamins Products (PROSTATE PO) Take by mouth daily.     Zinc 50 MG TABS Take by mouth.     levothyroxine (SYNTHROID) 50 MCG tablet TAKE 1 TABLET BY MOUTH DAILY BEFORE BREAKFAST 90 tablet 3   thiamine (VITAMIN B-1) 100 MG tablet SMARTSIG:By Mouth (Patient not taking: Reported on 03/12/2021)     thiamine 50 MG tablet Take 1 tablet (50 mg total) by mouth daily. (Patient not taking: Reported on 03/12/2021) 90 tablet 1   No current facility-administered medications on file prior to visit.        ROS:  All others reviewed and negative.  Objective        PE:  BP 100/70 (BP Location: Right Arm, Patient Position: Sitting, Cuff Size: Normal)   Pulse 71   Temp 98.1 F (36.7 C) (Oral)   Ht 5\' 8"  (1.727 m)   Wt 133 lb (60.3 kg)   SpO2 98%    BMI 20.22 kg/m                 Constitutional: Pt appears in NAD               HENT: Head: NCAT.                Right Ear: External ear normal.                 Left Ear: External ear normal.                Eyes: . Pupils are equal, round, and reactive to light. Conjunctivae and EOM are normal               Nose: without d/c or deformity               Neck: Neck supple. Gross normal ROM               Cardiovascular: Normal rate and regular rhythm.                 Pulmonary/Chest: Effort normal and breath sounds without rales or wheezing.                Abd:  Soft, NT, ND, + BS, no organomegaly               Neurological: Pt is alert. At baseline orientation, motor grossly intact               Skin: Skin is warm. No rashes, no other new lesions, LE edema - none               Psychiatric: Pt behavior is normal without agitation   Micro: none  Cardiac tracings I have personally interpreted today:  none  Pertinent Radiological findings (summarize): none   Lab Results  Component Value Date   WBC 4.4 08/28/2020   HGB 14.4 08/28/2020   HCT 43.0 08/28/2020   PLT 130.0 (L) 08/28/2020   GLUCOSE 94 03/12/2021   CHOL 114 03/12/2021   TRIG 59.0 03/12/2021   HDL 60.80 03/12/2021   LDLCALC 41 03/12/2021   ALT 11 03/12/2021   AST 14 03/12/2021   NA 141 03/12/2021   K 4.3 03/12/2021   CL 105 03/12/2021   CREATININE 0.87 03/12/2021   BUN 17 03/12/2021   CO2 30 03/12/2021   TSH 3.49 03/12/2021   HGBA1C 5.5 03/12/2021   Assessment/Plan:  Jason Barton is a 87 y.o. White or Caucasian [1] male with  has  a past medical history of COPD (chronic obstructive pulmonary disease) ([place]) (05/21/2011), Diverticulosis of colon (05/21/2011), Hypothyroidism, Impaired glucose tolerance (06/08/2012), Increased prostate specific antigen (PSA) velocity (05/24/2011), Nonmelanoma skin cancer (05/21/2011), S/P appendectomy (05/21/2011), S/P inguinal hernia repair (05/21/2011), S/p small bowel obstruction  (05/21/2011), and Thoracic scoliosis (05/21/2011).  Impaired glucose tolerance Lab Results  Component Value Date   HGBA1C 5.5 03/12/2021   Stable, pt to continue current medical treatment  - diet   Hypothyroidism Lab Results  Component Value Date   TSH 3.49 03/12/2021   Stable, pt to continue levothyroxine   Hyperlipidemia Lab Results  Component Value Date   LDLCALC 41 03/12/2021   Stable, pt to continue current statin crestor   Left inguinal hernia Asympt, no change, pt reassured, cont to follow  Blood pressure elevated without history of HTN BP borderline low today but asympt, higher at home per pt,  to f/u any worsening symptoms or concerns  BP Readings from Last 3 Encounters:  03/12/21 100/70  12/31/20 (!) 161/97  12/28/20 (!) 150/80   Followup: Return in about 4 months (around 07/12/2021).  Cathlean Cower, MD 03/14/2021 10:28 PM [facility]

## 2021-03-12 NOTE — Patient Instructions (Signed)
For your wife - ok to stop the Remeron (mirtazipine), and ok to start seroquel (quetipine) 25 mg twice per day, and you should hear about referral to Neurology  For you - Please continue all other medications as before, and refills have been done if requested.  Please have the pharmacy call with any other refills you may need.  Please continue your efforts at being more active, low cholesterol diet, and weight control  Please keep your appointments with your specialists as you may have planned  Please go to the LAB at the blood drawing area for the tests to be done  You will be contacted by phone if any changes need to be made immediately.  Otherwise, you will receive a letter about your results with an explanation, but please check with MyChart first.  Please remember to sign up for MyChart if you have not done so, as this will be important to you in the future with finding out test results, communicating by private email, and scheduling acute appointments online when needed.  Please make an Appointment to return in 4 months

## 2021-03-14 ENCOUNTER — Encounter: Payer: Self-pay | Admitting: Internal Medicine

## 2021-03-14 NOTE — Assessment & Plan Note (Signed)
BP borderline low today but asympt, higher at home per pt,  to f/u any worsening symptoms or concerns  BP Readings from Last 3 Encounters:  03/12/21 100/70  12/31/20 (!) 161/97  12/28/20 (!) 150/80

## 2021-03-14 NOTE — Assessment & Plan Note (Signed)
Asympt, no change, pt reassured, cont to follow

## 2021-03-14 NOTE — Assessment & Plan Note (Signed)
Lab Results  Component Value Date   LDLCALC 41 03/12/2021   Stable, pt to continue current statin crestor

## 2021-03-14 NOTE — Assessment & Plan Note (Signed)
Lab Results  Component Value Date   TSH 3.49 03/12/2021   Stable, pt to continue levothyroxine

## 2021-03-14 NOTE — Assessment & Plan Note (Signed)
Lab Results  Component Value Date   HGBA1C 5.5 03/12/2021   Stable, pt to continue current medical treatment  - diet

## 2021-04-06 ENCOUNTER — Ambulatory Visit: Payer: Medicare Other | Admitting: Neurology

## 2021-04-06 ENCOUNTER — Telehealth: Payer: Self-pay | Admitting: Neurology

## 2021-04-06 ENCOUNTER — Other Ambulatory Visit: Payer: Self-pay

## 2021-04-06 VITALS — BP 138/89 | HR 66 | Ht 68.0 in | Wt 133.0 lb

## 2021-04-06 DIAGNOSIS — Z8673 Personal history of transient ischemic attack (TIA), and cerebral infarction without residual deficits: Secondary | ICD-10-CM | POA: Diagnosis not present

## 2021-04-06 DIAGNOSIS — G459 Transient cerebral ischemic attack, unspecified: Secondary | ICD-10-CM | POA: Diagnosis not present

## 2021-04-06 NOTE — Patient Instructions (Signed)
I had a long discussion with the patient and his daughter about his episode of possible  TIA and answered questions.  I recommend we check CT angiogram of the brain and neck and continue aspirin for his stroke prevention and maintain aggressive risk factor modification with strict control of hypertension with blood pressure goal below 140/90, lipids with LDL cholesterol goal below 70 mg percent and diabetes with hemoglobin A1c goal below 6.5%.  He was encouraged to exercise and walk regularly and eat a healthy diet with lots of fruits, vegetables, cereals and whole grains.  He will return for follow-up in the future in 6 months or call earlier if necessary.

## 2021-04-06 NOTE — Telephone Encounter (Signed)
[employer] medicare order sent to GI, [facility] they will reach out to the patient to schedule.  

## 2021-04-06 NOTE — Progress Notes (Signed)
[facility] [address] [place]. [place] [address] [phone]       OFFICE FOLLOW UP VISIT NOTE  [name] Date of Birth:  29-Mar-1926 Medical Record Number:  [id number]   Referring MD:  Cathlean Cower  Reason for Referral:  TIA  HPI: Initial visit 12/31/2020 :Jason Barton is a 87 year old pleasant Caucasian male seen today for initial office consultation visit for TIA.  History is obtained from the patient and daughter as well as review of electronic medical records and I personally reviewed available imaging films in PACS.  He has past medical history of hypothyroidism, COPD, diverticulosis, scoliosis.  On 10/08/2020 we developed sudden onset of gait imbalance and a feeling of being pulled to the left while walking from his den towards the kitchen.  He was able to hold onto the kitchen counter after waiting a few minutes he was able to walk but felt he was slightly off balance.  He made it to the bedroom and lay down.  He felt slightly odd and different for about 30 minutes.  He denied any accompanying headache, blurred vision, slurred speech, double vision, vertigo, focal extremity weakness or numbness.  He denies any prior history of strokes or TIAs.  He subsequently had MRI scan of the brain done on 10/10/2020 which showed no acute abnormality.  Age-related changes of chronic small vessel disease and mild generalized atrophy were noted.  Carotid ultrasound was done as an outpatient and patient was told results were fine but I do not have the actual report.  Echocardiogram on 10/20/2020 showed ejection fraction of 60 to 65%.  Lab work on 11/09/2020 showed hemoglobin A1c of 5.5 and LDL cholesterol 54 mg percent.  Patient was started on aspirin 81 mg daily tolerating well with minor bruising and no bleeding.  He lives alone and is fairly independent with actives of daily living.  He is just sole caregiver for his wife who has Alzheimer's.  Patient has no new complaints  today.  His family history of stroke the patient has not himself had previous TIA or stroke ` Update 04/06/2021 : He returns for follow-up after last visit 3 months ago.  Is accompanied by his daughter.  He states he is doing well.  He has had no recurrent TIA or stroke symptoms.  He remains on aspirin 81 mg with tolerating well with only minor bruising but no bleeding.  His blood pressure is under good control and today it is 138/89.  He is tolerating Crestor well without muscle aches and pains.  He had follow-up lipid profile checked on 03/12/2021 which showed LDL cholesterol 41 mg percent.  Hemoglobin A1c was 5.5.  I had ordered CT angiogram of the brain and neck but for unclear reason it has not yet been done.  He is living with his wife in their independent home and has close supervision and help from his daughter.  `  ROS:   14 system review of systems is positive for dizziness, gait imbalance , easy bruising and being pulled to 1 side and all other systems negative  PMH:  Past Medical History:  Diagnosis Date   COPD (chronic obstructive pulmonary disease) ([place]) 05/21/2011   Diverticulosis of colon 05/21/2011   Hypothyroidism    Impaired glucose tolerance 06/08/2012   Increased prostate specific antigen (PSA) velocity 05/24/2011   Nonmelanoma skin cancer 05/21/2011   S/P appendectomy 05/21/2011   S/P inguinal hernia repair 05/21/2011   S/p small bowel obstruction 05/21/2011  Thoracic scoliosis 05/21/2011    Social History:  Social History   Socioeconomic History   Marital status: Married    Spouse name: Jason Barton   Number of children: 4   Years of education: MBA   Highest education level: Not on file  Occupational History   Occupation: retired  Tobacco Use   Smoking status: Never   Smokeless tobacco: Never  Scientific laboratory technician Use: Never used  Substance and Sexual Activity   Alcohol use: Yes    Comment: Rare   Drug use: No   Sexual activity: Not on file  Other Topics Concern   Not  on file  Social History Narrative   Lives at home with wife   Right Handed   Drinks 1-2 cups caffeine daily   Social Determinants of Health   Financial Resource Strain: Low Risk    Difficulty of Paying Living Expenses: Not hard at all  Food Insecurity: No Food Insecurity   Worried About Charity fundraiser in the Last Year: Never true   Arboriculturist in the Last Year: Never true  Transportation Needs: No Transportation Needs   Lack of Transportation (Medical): No   Lack of Transportation (Non-Medical): No  Physical Activity: Sufficiently Active   Days of Exercise per Week: 5 days   Minutes of Exercise per Session: 30 min  Stress: No Stress Concern Present   Feeling of Stress : Not at all  Social Connections: Socially Integrated   Frequency of Communication with Friends and Family: More than three times a week   Frequency of Social Gatherings with Friends and Family: More than three times a week   Attends Religious Services: More than 4 times per year   Active Member of [employer] or Organizations: Yes   Attends Music therapist: More than 4 times per year   Marital Status: Married  Human resources officer Violence: Not At Risk   Fear of Current or Ex-Partner: No   Emotionally Abused: No   Physically Abused: No   Sexually Abused: No    Medications:   Current Outpatient Medications on File Prior to Visit  Medication Sig Dispense Refill   alfuzosin (UROXATRAL) 10 MG 24 hr tablet Take 1 tablet (10 mg total) by mouth daily with breakfast. 90 tablet 1   Ascorbic Acid (VITAMIN C WITH ROSE HIPS) 500 MG tablet Take 500 mg by mouth daily.     aspirin (SB LOW DOSE ASA EC) 81 MG EC tablet Take 1 tablet (81 mg total) by mouth daily. Swallow whole. 100 tablet 99   Cholecalciferol (VITAMIN D PO) Take 5,000 Units by mouth every morning.     Iron-FA-B Cmp-C-Biot-Probiotic (FUSION PLUS) CAPS Take 1 capsule by mouth daily. 90 capsule 1   levothyroxine (SYNTHROID) 50 MCG tablet TAKE 1  TABLET BY MOUTH DAILY BEFORE BREAKFAST 90 tablet 3   polyethylene glycol (MIRALAX / GLYCOLAX) 17 g packet Take 17 g by mouth daily.     rosuvastatin (CRESTOR) 10 MG tablet Take 1 tablet (10 mg total) by mouth daily. 90 tablet 3   selenium 50 MCG TABS tablet Take 50 mcg by mouth daily.     Specialty Vitamins Products (PROSTATE PO) Take by mouth daily.     thiamine (VITAMIN B-1) 100 MG tablet      thiamine 50 MG tablet Take 1 tablet (50 mg total) by mouth daily. 90 tablet 1   Zinc 50 MG TABS Take by mouth.     No current  facility-administered medications on file prior to visit.    Allergies:   Allergies  Allergen Reactions   Augmentin [Amoxicillin-Pot Clavulanate] Rash    Itching rash to all torso 2 days after start augmentin 12/2017    Physical Exam General: Frail malnourished looking elderly Caucasian male, seated, in no evident distress Head: head normocephalic and atraumatic.   Neck: supple with no carotid or supraclavicular bruits Cardiovascular: regular rate and rhythm, soft ejection systolic murmur.   Musculoskeletal: Severe kyphoscoliosis.   Skin:  no rash/petichiae Vascular:  Normal pulses all extremities  Neurologic Exam Mental Status: Awake and fully alert. Oriented to place and time. Recent and remote memory intact. Attention span, concentration and fund of knowledge appropriate. Mood and affect appropriate.  Cranial Nerves: Fundoscopic exam not done. Pupils equal, briskly reactive to light. Extraocular movements full without nystagmus. Visual fields full to confrontation. Hearing intact. Facial sensation intact. Face, tongue, palate moves normally and symmetrically.  Motor: Normal bulk and tone. Normal strength in all tested extremity muscles. Sensory.: intact to touch , pinprick , position and vibratory sensation.  Coordination: Rapid alternating movements normal in all extremities. Finger-to-nose and heel-to-shin performed accurately bilaterally. Gait and Station: Arises  from chair without difficulty. Stance is normal. Gait demonstrates normal stride length and balance . Able to heel, toe and tandem walk with moderate difficulty.  Reflexes: 1+ and symmetric. Toes downgoing.       ASSESSMENT: 86 year old Caucasian male with transient episode of gait imbalance and being pulled to 1 side likely of posterior circulation TIA from small vessel disease.  Vascular risk factors of hyperlipidemia, hypertension and age     PLAN: I had a long discussion with the patient and his daughter about his episode of possible  TIA and answered questions.  I recommend we check CT angiogram of the brain and neck and continue aspirin for his stroke prevention and maintain aggressive risk factor modification with strict control of hypertension with blood pressure goal below 140/90, lipids with LDL cholesterol goal below 70 mg percent and diabetes with hemoglobin A1c goal below 6.5%.  He was encouraged to exercise and walk regularly and eat a healthy diet with lots of fruits, vegetables, cereals and whole grains.  He will return for follow-up in the future in 6 months or call earlier if necessary.. Greater than 50% time during this 25-minute visit was spent on counseling and coordination of care about his episode of TIA and discussion about stroke prevention and answering questions.    Antony Contras, MD Note: This document was prepared with digital dictation and possible smart phrase technology. Any transcriptional errors that result from this process are unintentional.

## 2021-05-21 ENCOUNTER — Encounter: Payer: Self-pay | Admitting: Internal Medicine

## 2021-06-19 ENCOUNTER — Encounter: Payer: Self-pay | Admitting: Internal Medicine

## 2021-06-21 ENCOUNTER — Encounter: Payer: Self-pay | Admitting: Neurology

## 2021-06-22 ENCOUNTER — Telehealth: Payer: Self-pay | Admitting: Cardiology

## 2021-06-22 NOTE — Telephone Encounter (Signed)
Jason Barton has reviewed the patient's chart. Per her vo, she would like him to proceed to ED for a full emergent work up as this could be a warning of a full blown stroke. Additionally, he has reported cold-like symptoms to his other offices. He is in agreement with this plan. The appt on 06/23/21 has been canceled.

## 2021-06-22 NOTE — Telephone Encounter (Signed)
°  Per MyChart scheduling request:  This is Jason Barton daughter, [name], I'd like to request an appt. as soon as possible.  In the past week, my dad has experienced what we feel are 2 mini strokes.  He did not tell us about either episode until this morning.  The 1st occurred Tues. night getting into bed, & the 2nd was last night trying to get out of bed. Both times he experienced extreme weakness in all 4 limbs.  He said he had feeling in his limbs, just no strength to move them.  Each episode lasted about 10 mins.

## 2021-06-22 NOTE — Telephone Encounter (Signed)
I spoke to the patient. No further events reported. Confirms he has continued aspirin 81mg , one tablet daily. He would like to be evaluated in the office. Scheduled with [name] on 06/23/21 (Dr. Leonie Man is out of the office this week). Also, I provided him the number to [facility] [facility] and requested he call to schedule his ordered CTA head and CTA neck. He verbalized understanding that in the event he is exhibiting stroke symptoms, he should proceed to the ED.

## 2021-06-22 NOTE — Telephone Encounter (Signed)
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

## 2021-06-22 NOTE — Telephone Encounter (Signed)
Connected to [facility] 12.31.2022.  Caller states her father has had a upper respiratory issue. He said that last night he experienced a mini stroke where he tried to get out of bed had no strength on any of his limbs and slid to the floor waited 12 minutes and got back up. It is the second time this week it happens. No symptoms right now. today seems worse. episode happened at 5:30 am. he could moved but no strength. Tuesday happened the same at night. no dizziness no weakness now. patient is just tired. No sob or chest pain. temp 97.5 earlier. no confusion no forgetfulness.  Advised to call EMS

## 2021-06-23 ENCOUNTER — Other Ambulatory Visit: Payer: Self-pay

## 2021-06-23 ENCOUNTER — Encounter (HOSPITAL_BASED_OUTPATIENT_CLINIC_OR_DEPARTMENT_OTHER): Payer: Self-pay | Admitting: Emergency Medicine

## 2021-06-23 ENCOUNTER — Emergency Department (HOSPITAL_BASED_OUTPATIENT_CLINIC_OR_DEPARTMENT_OTHER): Payer: Medicare Other

## 2021-06-23 ENCOUNTER — Emergency Department (HOSPITAL_BASED_OUTPATIENT_CLINIC_OR_DEPARTMENT_OTHER)
Admission: EM | Admit: 2021-06-23 | Discharge: 2021-06-23 | Disposition: A | Discharge status: Home / Self Care | Payer: Medicare Other | Attending: Emergency Medicine | Admitting: Emergency Medicine

## 2021-06-23 ENCOUNTER — Telehealth: Payer: Self-pay | Admitting: Neurology

## 2021-06-23 ENCOUNTER — Ambulatory Visit: Payer: Medicare Other | Admitting: Adult Health

## 2021-06-23 DIAGNOSIS — Z79899 Other long term (current) drug therapy: Secondary | ICD-10-CM | POA: Insufficient documentation

## 2021-06-23 DIAGNOSIS — M6281 Muscle weakness (generalized): Secondary | ICD-10-CM | POA: Diagnosis not present

## 2021-06-23 DIAGNOSIS — Z8673 Personal history of transient ischemic attack (TIA), and cerebral infarction without residual deficits: Secondary | ICD-10-CM | POA: Insufficient documentation

## 2021-06-23 DIAGNOSIS — R531 Weakness: Secondary | ICD-10-CM

## 2021-06-23 DIAGNOSIS — Z7982 Long term (current) use of aspirin: Secondary | ICD-10-CM | POA: Diagnosis not present

## 2021-06-23 LAB — CBC
HCT: 38.7 % — ABNORMAL LOW (ref 39.0–52.0)
Hemoglobin: 12.7 g/dL — ABNORMAL LOW (ref 13.0–17.0)
MCH: 30 pg (ref 26.0–34.0)
MCHC: 32.8 g/dL (ref 30.0–36.0)
MCV: 91.3 fL (ref 80.0–100.0)
Platelets: 95 10*3/uL — ABNORMAL LOW (ref 150–400)
RBC: 4.24 MIL/uL (ref 4.22–5.81)
RDW: 14.5 % (ref 11.5–15.5)
WBC: 2.5 10*3/uL — ABNORMAL LOW (ref 4.0–10.5)
nRBC: 0 % (ref 0.0–0.2)

## 2021-06-23 LAB — BASIC METABOLIC PANEL
Anion gap: 8 (ref 5–15)
BUN: 16 mg/dL (ref 8–23)
CO2: 27 mmol/L (ref 22–32)
Calcium: 8.4 mg/dL — ABNORMAL LOW (ref 8.9–10.3)
Chloride: 105 mmol/L (ref 98–111)
Creatinine, Ser: 0.82 mg/dL (ref 0.61–1.24)
GFR, Estimated: >60 mL/min (ref >60)
Glucose, Bld: 128 mg/dL — ABNORMAL HIGH (ref 70–99)
Potassium: 4 mmol/L (ref 3.5–5.1)
Sodium: 140 mmol/L (ref 135–145)

## 2021-06-23 LAB — URINALYSIS, ROUTINE W REFLEX MICROSCOPIC
Bilirubin Urine: NEGATIVE
Glucose, UA: NEGATIVE mg/dL
Hgb urine dipstick: NEGATIVE
Ketones, ur: NEGATIVE mg/dL
Leukocytes,Ua: NEGATIVE
Nitrite: NEGATIVE
Specific Gravity, Urine: 1.022 (ref 1.005–1.030)
pH: 5.5 (ref 5.0–8.0)

## 2021-06-23 MED ORDER — IOHEXOL 350 MG/ML SOLN
75.0000 mL | Freq: Once | INTRAVENOUS | Status: AC | PRN
  Administered 2021-06-23: 75 mL via INTRAVENOUS

## 2021-06-23 NOTE — Telephone Encounter (Signed)
I spoke to the patient's daughter, Jason Barton. She wanted to review the plan for her father. Discussed the rationale for [name] making the decision for him to proceed to the ED. The daughter is in agreement and will take him today.

## 2021-06-23 NOTE — Telephone Encounter (Signed)
Left message for a return call

## 2021-06-23 NOTE — ED Notes (Signed)
Per daughter, pt fell last Tuesday and again Saturday evening. Pt reports he slipped out of the bed because he was unable to maintain his balance. Bilateral weakness throughout. Neuro sent him here for CT scan

## 2021-06-23 NOTE — ED Notes (Signed)
Patient transported to CT 

## 2021-06-23 NOTE — Telephone Encounter (Signed)
Pt's daughter called requesting a call back to discuss what was gone over with her father.

## 2021-06-23 NOTE — ED Provider Notes (Signed)
[facility] Provider Note   CSN: [id number] Arrival date & time: 06/23/21 1320  History Chief Complaint  Patient presents with   Weakness    Jason Barton is a 88 y.o. male with history of TIA episode in April 2022 described as being off balance has been followed by Neurology (Dr. Leonie Man) and has had not further issues until about a week ago when he woke up during the night go to the bathroom and was too wear to get up out of bed. He was able to speak to his wife but he was weak in all four arms/legs. He had managed to get to a sitting position but could not go any further so he lied back down and symptoms improved 10-70min later and he felt fine. A day or so later he began having some mild URI symptoms which have since resolved. He had another episode of feeling weak after waking up 3 days ago, he was able to get out of bed but slide down on the floor. Again he was back to baseline 10-34min later and was able to pull himself up and go about his day. He has been feeling fine since then. His daughter sent a message to Dr. Clydene Fake office, initially with a plan for outpatient follow up today and a CTA head/neck scheduled for tomorrow (he has had this ordered twice but for some reason has not had it done since his initial TIA visit). Daughter reports she was called later in the day and told to come to the ER for 'a full stroke evaluation'.    Home Medications Prior to Admission medications   Medication Sig Start Date End Date Taking? Authorizing Provider  alfuzosin (UROXATRAL) 10 MG 24 hr tablet Take 1 tablet (10 mg total) by mouth daily with breakfast. 02/05/20   Janith Lima, MD  Ascorbic Acid (VITAMIN C WITH ROSE HIPS) 500 MG tablet Take 500 mg by mouth daily.    [provider]  aspirin (SB LOW DOSE ASA EC) 81 MG EC tablet Take 1 tablet (81 mg total) by mouth daily. Swallow whole. 10/09/20   Biagio Borg, MD  Cholecalciferol (VITAMIN D PO) Take 5,000  Units by mouth every morning.    [provider]  Iron-FA-B Cmp-C-Biot-Probiotic (FUSION PLUS) CAPS Take 1 capsule by mouth daily. 02/14/20   Janith Lima, MD  levothyroxine (SYNTHROID) 50 MCG tablet TAKE 1 TABLET BY MOUTH DAILY BEFORE BREAKFAST 03/12/21   Biagio Borg, MD  polyethylene glycol (MIRALAX / GLYCOLAX) 17 g packet Take 17 g by mouth daily.    [provider]  rosuvastatin (CRESTOR) 10 MG tablet Take 1 tablet (10 mg total) by mouth daily. 10/09/20   Biagio Borg, MD  selenium 50 MCG TABS tablet Take 50 mcg by mouth daily.    [provider]  Specialty Vitamins Products (PROSTATE PO) Take by mouth daily.    [provider]  thiamine (VITAMIN B-1) 100 MG tablet  02/14/20   [provider]  thiamine 50 MG tablet Take 1 tablet (50 mg total) by mouth daily. 02/14/20   Janith Lima, MD  Zinc 50 MG TABS Take by mouth.    [provider]     Allergies    Augmentin [amoxicillin-pot clavulanate]   Review of Systems   Review of Systems Please see HPI for pertinent positives and negatives  Physical Exam BP (!) 156/88    Pulse 61    Temp 97.8  F (36.6 C)    Resp 12    SpO2 99%   Physical Exam Vitals and nursing note reviewed.  Constitutional:      Appearance: Normal appearance.  HENT:     Head: Normocephalic and atraumatic.     Nose: Nose normal.     Mouth/Throat:     Mouth: Mucous membranes are moist.  Eyes:     Extraocular Movements: Extraocular movements intact.     Conjunctiva/sclera: Conjunctivae normal.  Cardiovascular:     Rate and Rhythm: Normal rate.  Pulmonary:     Effort: Pulmonary effort is normal.     Breath sounds: Normal breath sounds.  Abdominal:     General: Abdomen is flat.     Palpations: Abdomen is soft.     Tenderness: There is no abdominal tenderness.  Musculoskeletal:        General: No swelling. Normal range of motion.     Cervical back: Neck supple.  Skin:    General: Skin is warm and  dry.  Neurological:     General: No focal deficit present.     Mental Status: He is alert and oriented to person, place, and time.     Cranial Nerves: No cranial nerve deficit.     Sensory: No sensory deficit.     Motor: No weakness.     Gait: Gait normal.  Psychiatric:        Mood and Affect: Mood normal.    ED Results / Procedures / Treatments   EKG EKG Interpretation  Date/Time:  Wednesday June 23 2021 14:18:37 EST Ventricular Rate:  76 PR Interval:  142 QRS Duration: 92 QT Interval:  410 QTC Calculation: 461 R Axis:   80 Text Interpretation: Sinus rhythm with Premature atrial complexes with Abberant conduction Nonspecific T wave abnormality Prolonged QT Abnormal ECG When compared with ECG of 06-Sep-2007 21:19, Abberant conduction is now Present Nonspecific T wave abnormality now evident in Inferior leads Nonspecific T wave abnormality now evident in Anterior leads Confirmed by Regan Lemming ([id number]) on 06/23/2021 2:26:59 PM  Procedures Procedures  Medications Ordered in the ED Medications  iohexol (OMNIPAQUE) 350 MG/ML injection 75 mL (75 mLs Intravenous Contrast Given 06/23/21 1912)    Initial Impression and Plan  Patient here with two episodes of generalized weakness, not describing any focal deficits but felt by the Neurology clinic to possibly have been a TIA or stroke. He is at baseline now with no complaints. Sent to the ED by Neuro clinic. CT done in triage is neg for acute process. Patient would like to go ahead and get his CTA done while he is here as well. Labs done in triage show CBC with mild pancytopenia, could be due to recent viral infection, will need close outpatient follow up. BMP is unremarkable. Will discuss with Neurology if there is any further workup needed from their end while he is here today.   ED Course   Clinical Course as of 06/23/21 2016  Wed Jun 23, 2021  1920 Spoke with Dr. Leonel Ramsay, Neurology, who agrees with plan to do CTA today, unless  there is an unexpected critical stenosis we will plan for outpatient follow up.  [CS]  1920 UA is normal.  [CS]  2013 CTA negative for acute or large vessel occlusion. Patient remains asymptomatic. Recommend outpatient follow up with Neurology.  [CS]    Clinical Course User Index [CS] Truddie Hidden, MD     MDM Rules/Calculators/A&P Medical Decision Making Problems Addressed: General weakness:  complicated acute illness or injury  Amount and/or Complexity of Data Reviewed External Data Reviewed: notes.    Details: Neurology office Labs: ordered. Decision-making details documented in ED Course. Radiology: ordered and independent interpretation performed. Decision-making details documented in ED Course.  Risk Decision regarding hospitalization.    Final Clinical Impression(s) / ED Diagnoses Final diagnoses:  General weakness    Rx / DC Orders ED Discharge Orders     None        Truddie Hidden, MD 06/23/21 2016

## 2021-06-23 NOTE — Telephone Encounter (Signed)
I have spoken to the patient's daughter. Please see additional information in the mychart message on 06/21/21.

## 2021-06-23 NOTE — ED Triage Notes (Signed)
Weakness to extremities 1 week ago , was suggested by neuro to come to ER today for Ct scan prior to visit . Pt don't have any symptoms today , pt denies weakness. Denies headache or vision changes , pt ambulatory to triage room with steady gait

## 2021-06-24 ENCOUNTER — Other Ambulatory Visit: Payer: Medicare Other

## 2021-06-24 NOTE — Progress Notes (Deleted)
HPI: FU bradycardia.  Seen with possible TIA April 2022.  Also noted to have bradycardia with heart rate of 49.  Carotid Dopplers April 2022 showed near normal bilaterally.  Echocardiogram May 2022 showed normal LV function, grade 1 diastolic dysfunction, mild aortic insufficiency, mildly dilated aortic root at 41 mm.  Monitor June 2022 showed sinus rhythm with PACs, brief PAT, PVCs; lowest heart rate 38 at 5:54 AM.  CTA January 2023 showed no large vessel occlusion, moderate stenosis at the supraclinoid left internal carotid artery.  Since last seen  Current Outpatient Medications  Medication Sig Dispense Refill   alfuzosin (UROXATRAL) 10 MG 24 hr tablet Take 1 tablet (10 mg total) by mouth daily with breakfast. 90 tablet 1   Ascorbic Acid (VITAMIN C WITH ROSE HIPS) 500 MG tablet Take 500 mg by mouth daily.     aspirin (SB LOW DOSE ASA EC) 81 MG EC tablet Take 1 tablet (81 mg total) by mouth daily. Swallow whole. 100 tablet 99   Cholecalciferol (VITAMIN D PO) Take 5,000 Units by mouth every morning.     Iron-FA-B Cmp-C-Biot-Probiotic (FUSION PLUS) CAPS Take 1 capsule by mouth daily. 90 capsule 1   levothyroxine (SYNTHROID) 50 MCG tablet TAKE 1 TABLET BY MOUTH DAILY BEFORE BREAKFAST 90 tablet 3   polyethylene glycol (MIRALAX / GLYCOLAX) 17 g packet Take 17 g by mouth daily.     rosuvastatin (CRESTOR) 10 MG tablet Take 1 tablet (10 mg total) by mouth daily. 90 tablet 3   selenium 50 MCG TABS tablet Take 50 mcg by mouth daily.     Specialty Vitamins Products (PROSTATE PO) Take by mouth daily.     thiamine (VITAMIN B-1) 100 MG tablet      thiamine 50 MG tablet Take 1 tablet (50 mg total) by mouth daily. 90 tablet 1   Zinc 50 MG TABS Take by mouth.     No current facility-administered medications for this visit.     Past Medical History:  Diagnosis Date   COPD (chronic obstructive pulmonary disease) ([place]) 05/21/2011   Diverticulosis of colon 05/21/2011   Hypothyroidism    Impaired  glucose tolerance 06/08/2012   Increased prostate specific antigen (PSA) velocity 05/24/2011   Nonmelanoma skin cancer 05/21/2011   S/P appendectomy 05/21/2011   S/P inguinal hernia repair 05/21/2011   S/p small bowel obstruction 05/21/2011   Thoracic scoliosis 05/21/2011    Past Surgical History:  Procedure Laterality Date   APPENDECTOMY  1939   COLON RESECTION     2009 for a "kink in colon" adhesions after appendectomy   SHOULDER SURGERY     dr sypher; rotater cuff   STRABISMUS SURGERY Right 11/21/2014   Procedure: REPAIR STRABISMUS RIGHT EYE ;  Surgeon: Everitt Amber, MD;  Location: [facility];  Service: Ophthalmology;  Laterality: Right;   TONSILLECTOMY  1942    Social History   Socioeconomic History   Marital status: Married    Spouse name: June   Number of children: 4   Years of education: MBA   Highest education level: Not on file  Occupational History   Occupation: retired  Tobacco Use   Smoking status: Never   Smokeless tobacco: Never  Scientific laboratory technician Use: Never used  Substance and Sexual Activity   Alcohol use: Yes    Comment: Rare   Drug use: No   Sexual activity: Not on file  Other Topics Concern   Not on file  Social  History Narrative   Lives at home with wife   Right Handed   Drinks 1-2 cups caffeine daily   Social Determinants of Health   Financial Resource Strain: Low Risk    Difficulty of Paying Living Expenses: Not hard at all  Food Insecurity: No Food Insecurity   Worried About Charity fundraiser in the Last Year: Never true   Arboriculturist in the Last Year: Never true  Transportation Needs: No Transportation Needs   Lack of Transportation (Medical): No   Lack of Transportation (Non-Medical): No  Physical Activity: Sufficiently Active   Days of Exercise per Week: 5 days   Minutes of Exercise per Session: 30 min  Stress: No Stress Concern Present   Feeling of Stress : Not at all  Social Connections: Socially Integrated    Frequency of Communication with Friends and Family: More than three times a week   Frequency of Social Gatherings with Friends and Family: More than three times a week   Attends Religious Services: More than 4 times per year   Active Member of [employer] or Organizations: Yes   Attends Music therapist: More than 4 times per year   Marital Status: Married  Human resources officer Violence: Not At Risk   Fear of Current or Ex-Partner: No   Emotionally Abused: No   Physically Abused: No   Sexually Abused: No    Family History  Problem Relation Age of Onset   Arthritis Other    Sudden death Other    Colon cancer Neg Hx    Esophageal cancer Neg Hx    Rectal cancer Neg Hx     ROS: no fevers or chills, productive cough, hemoptysis, dysphasia, odynophagia, melena, hematochezia, dysuria, hematuria, rash, seizure activity, orthopnea, PND, pedal edema, claudication. Remaining systems are negative.  Physical Exam: Well-developed well-nourished in no acute distress.  Skin is warm and dry.  HEENT is normal.  Neck is supple.  Chest is clear to auscultation with normal expansion.  Cardiovascular exam is regular rate and rhythm.  Abdominal exam nontender or distended. No masses palpated. Extremities show no edema. neuro grossly intact  ECG- personally reviewed  A/P  1 bradycardia-patient denies any history of presyncope or syncope.  No indication for further therapy.  2 aortic atherosclerosis-noted on previous CT scan.  Continue statin.  3 hyperlipidemia-continue statin.  Kirk Ruths, MD

## 2021-06-24 NOTE — Telephone Encounter (Signed)
Pt called wanting to inform the provider that he has had his CT scans done at the ER and is wanting to be called once the provider has received and read them. Please advise.

## 2021-06-24 NOTE — Telephone Encounter (Signed)
I spoke to the patient. Reports going to the ED on 06/23/21. Completed CTA head and CTA neck. He wanted Dr. Leonie Man to be provided with this information. He will continue his aspirin 81mg , one tablet daily and keep his current pending follow up on 10/05/21.

## 2021-06-29 ENCOUNTER — Other Ambulatory Visit: Payer: Medicare Other

## 2021-06-30 ENCOUNTER — Other Ambulatory Visit: Payer: Medicare Other

## 2021-07-07 ENCOUNTER — Ambulatory Visit: Payer: Medicare Other | Admitting: Cardiology

## 2021-07-12 ENCOUNTER — Ambulatory Visit: Payer: Medicare Other | Admitting: Internal Medicine

## 2021-07-13 ENCOUNTER — Other Ambulatory Visit: Payer: Self-pay

## 2021-07-13 ENCOUNTER — Ambulatory Visit: Payer: Medicare Other | Admitting: Internal Medicine

## 2021-07-13 ENCOUNTER — Encounter: Payer: Self-pay | Admitting: Internal Medicine

## 2021-07-13 VITALS — BP 116/76 | HR 52 | Temp 97.6°F | Ht 68.0 in | Wt 128.0 lb

## 2021-07-13 DIAGNOSIS — R030 Elevated blood-pressure reading, without diagnosis of hypertension: Secondary | ICD-10-CM

## 2021-07-13 DIAGNOSIS — R7302 Impaired glucose tolerance (oral): Secondary | ICD-10-CM

## 2021-07-13 DIAGNOSIS — I700 Atherosclerosis of aorta: Secondary | ICD-10-CM

## 2021-07-13 DIAGNOSIS — Z0000 Encounter for general adult medical examination without abnormal findings: Secondary | ICD-10-CM | POA: Diagnosis not present

## 2021-07-13 DIAGNOSIS — E039 Hypothyroidism, unspecified: Secondary | ICD-10-CM

## 2021-07-13 DIAGNOSIS — E7800 Pure hypercholesterolemia, unspecified: Secondary | ICD-10-CM

## 2021-07-13 NOTE — Progress Notes (Signed)
Patient ID: Jason Barton, male   DOB: 1925-09-30, 88 y.o.   MRN: [id number]         Chief Complaint:: wellness exam and Follow-up         HPI:  Jason Barton is a 88 y.o. male here for wellness exam; declines covid booster, shingrix, o/w up to date                        Also Pt denies chest pain, increased sob or doe, wheezing, orthopnea, PND, increased LE swelling, palpitations, dizziness or syncope.   Pt denies polydipsia, polyuria, or new focal neuro s/s.   Pt denies fever, wt loss, night sweats, loss of appetite, or other constitutional symptoms     Wt Readings from Last 3 Encounters:  07/13/21 128 lb (58.1 kg)  04/06/21 133 lb (60.3 kg)  03/12/21 133 lb (60.3 kg)   BP Readings from Last 3 Encounters:  07/13/21 116/76  06/23/21 (!) 153/92  04/06/21 138/89   Immunization History  Administered Date(s) Administered   Influenza, Seasonal, Injecte, Preservative Fre 07/17/2012   PFIZER(Purple Top)SARS-COV-2 Vaccination 07/26/2019, 08/20/2019, 04/15/2020   Pneumococcal Conjugate-13 06/11/2013   Pneumococcal Polysaccharide-23 08/17/2017   Zoster, Live 07/24/2014   There are no preventive care reminders to display for this patient.     Past Medical History:  Diagnosis Date   COPD (chronic obstructive pulmonary disease) ([place]) 05/21/2011   Diverticulosis of colon 05/21/2011   Hypothyroidism    Impaired glucose tolerance 06/08/2012   Increased prostate specific antigen (PSA) velocity 05/24/2011   Nonmelanoma skin cancer 05/21/2011   S/P appendectomy 05/21/2011   S/P inguinal hernia repair 05/21/2011   S/p small bowel obstruction 05/21/2011   Thoracic scoliosis 05/21/2011   Past Surgical History:  Procedure Laterality Date   APPENDECTOMY  1939   COLON RESECTION     2009 for a "kink in colon" adhesions after appendectomy   SHOULDER SURGERY     dr sypher; rotater cuff   STRABISMUS SURGERY Right 11/21/2014   Procedure: REPAIR STRABISMUS RIGHT EYE ;  Surgeon: Everitt Amber,  MD;  Location: [facility];  Service: Ophthalmology;  Laterality: Right;   TONSILLECTOMY  1942    reports that he has never smoked. He has never used smokeless tobacco. He reports current alcohol use. He reports that he does not use drugs. family history includes Arthritis in an other family member; Sudden death in an other family member. Allergies  Allergen Reactions   Augmentin [Amoxicillin-Pot Clavulanate] Rash    Itching rash to all torso 2 days after start augmentin 12/2017   Current Outpatient Medications on File Prior to Visit  Medication Sig Dispense Refill   alfuzosin (UROXATRAL) 10 MG 24 hr tablet Take 1 tablet (10 mg total) by mouth daily with breakfast. 90 tablet 1   Ascorbic Acid (VITAMIN C WITH ROSE HIPS) 500 MG tablet Take 500 mg by mouth daily.     aspirin (SB LOW DOSE ASA EC) 81 MG EC tablet Take 1 tablet (81 mg total) by mouth daily. Swallow whole. 100 tablet 99   Cholecalciferol (VITAMIN D PO) Take 5,000 Units by mouth every morning.     Iron-FA-B Cmp-C-Biot-Probiotic (FUSION PLUS) CAPS Take 1 capsule by mouth daily. 90 capsule 1   levothyroxine (SYNTHROID) 50 MCG tablet TAKE 1 TABLET BY MOUTH DAILY BEFORE BREAKFAST 90 tablet 3   polyethylene glycol (MIRALAX / GLYCOLAX) 17 g packet Take 17 g by mouth daily.  rosuvastatin (CRESTOR) 10 MG tablet Take 1 tablet (10 mg total) by mouth daily. 90 tablet 3   selenium 50 MCG TABS tablet Take 50 mcg by mouth daily.     Specialty Vitamins Products (PROSTATE PO) Take by mouth daily.     thiamine (VITAMIN B-1) 100 MG tablet      thiamine 50 MG tablet Take 1 tablet (50 mg total) by mouth daily. 90 tablet 1   Zinc 50 MG TABS Take by mouth.     No current facility-administered medications on file prior to visit.        ROS:  All others reviewed and negative.  Objective        PE:  BP 116/76 (BP Location: Left Arm, Patient Position: Sitting, Cuff Size: Normal)    Pulse (!) 52    Temp 97.6 F (36.4 C) (Oral)    Ht 5'  8" (1.727 m)    Wt 128 lb (58.1 kg)    SpO2 98%    BMI 19.46 kg/m                 Constitutional: Pt appears in NAD               HENT: Head: NCAT.                Right Ear: External ear normal.                 Left Ear: External ear normal.                Eyes: . Pupils are equal, round, and reactive to light. Conjunctivae and EOM are normal               Nose: without d/c or deformity               Neck: Neck supple. Gross normal ROM               Cardiovascular: Normal rate and regular rhythm.                 Pulmonary/Chest: Effort normal and breath sounds without rales or wheezing.                Abd:  Soft, NT, ND, + BS, no organomegaly               Neurological: Pt is alert. At baseline orientation, motor grossly intact               Skin: Skin is warm. No rashes, no other new lesions, LE edema - none               Psychiatric: Pt behavior is normal without agitation   Micro: none  Cardiac tracings I have personally interpreted today:  none  Pertinent Radiological findings (summarize): none   Lab Results  Component Value Date   WBC 2.5 (L) 06/23/2021   HGB 12.7 (L) 06/23/2021   HCT 38.7 (L) 06/23/2021   PLT 95 (L) 06/23/2021   GLUCOSE 128 (H) 06/23/2021   CHOL 114 03/12/2021   TRIG 59.0 03/12/2021   HDL 60.80 03/12/2021   LDLCALC 41 03/12/2021   ALT 11 03/12/2021   AST 14 03/12/2021   NA 140 06/23/2021   K 4.0 06/23/2021   CL 105 06/23/2021   CREATININE 0.82 06/23/2021   BUN 16 06/23/2021   CO2 27 06/23/2021   TSH 3.49 03/12/2021   HGBA1C 5.5 03/12/2021   Assessment/Plan:  Jason Rinks  [name] is a 88 y.o. White or Caucasian [1] male with  has a past medical history of COPD (chronic obstructive pulmonary disease) ([place]) (05/21/2011), Diverticulosis of colon (05/21/2011), Hypothyroidism, Impaired glucose tolerance (06/08/2012), Increased prostate specific antigen (PSA) velocity (05/24/2011), Nonmelanoma skin cancer (05/21/2011), S/P appendectomy (05/21/2011), S/P inguinal  hernia repair (05/21/2011), S/p small bowel obstruction (05/21/2011), and Thoracic scoliosis (05/21/2011).  Preventative health care Age and sex appropriate education and counseling updated with regular exercise and diet Referrals for preventative services - none needed Immunizations addressed - declines covid booster, shingrix Smoking counseling  - none needed Evidence for depression or other mood disorder - none significant Most recent labs reviewed. I have personally reviewed and have noted: 1) the patient's medical and social history 2) The patient's current medications and supplements 3) The patient's height, weight, and BMI have been recorded in the chart   Aortic atherosclerosis (HCC) Pt for low chol diet, exercise, cont crestor  Blood pressure elevated without history of HTN Improved, stable, cont current med tx  Hyperlipidemia Lab Results  Component Value Date   LDLCALC 41 03/12/2021   Stable, pt to continue current statin crestor   Hypothyroidism Lab Results  Component Value Date   TSH 3.49 03/12/2021   Stable, pt to continue levothyroxine   Impaired glucose tolerance Lab Results  Component Value Date   HGBA1C 5.5 03/12/2021   Stable, pt to continue current medical treatment  - diet  Followup: Return in about 6 months (around 01/10/2022).  Cathlean Cower, MD 07/13/2021 10:18 PM [facility]

## 2021-07-13 NOTE — Assessment & Plan Note (Signed)
Lab Results  Component Value Date   LDLCALC 41 03/12/2021   Stable, pt to continue current statin crestor

## 2021-07-13 NOTE — Assessment & Plan Note (Signed)
Lab Results  Component Value Date   HGBA1C 5.5 03/12/2021   Stable, pt to continue current medical treatment  - diet

## 2021-07-13 NOTE — Assessment & Plan Note (Signed)

## 2021-07-13 NOTE — Progress Notes (Signed)
Patient ID: Jason Barton, male   DOB: Nov 02, 1925, 88 y.o.   MRN: [id number]         Chief Complaint:: wellness exam and Follow-up  Aortic atherosclerosis, elevated BP, hld, hypothryoidism, hyperglycemia       HPI:  Jason Barton is a 88 y.o. male here for wellness exam; declines covid booster, shingrix                        Also Pt denies chest pain, increased sob or doe, wheezing, orthopnea, PND, increased LE swelling, palpitations, dizziness or syncope.   Pt denies polydipsia, polyuria, or new focal neuro s/s.   Pt denies fever, wt loss, night sweats, loss of appetite, or other constitutional symptoms  Denies hyper or hypo thyroid symptoms such as voice, skin or hair change.  No other new complaints   Wt Readings from Last 3 Encounters:  07/13/21 128 lb (58.1 kg)  04/06/21 133 lb (60.3 kg)  03/12/21 133 lb (60.3 kg)   BP Readings from Last 3 Encounters:  07/13/21 116/76  06/23/21 (!) 153/92  04/06/21 138/89   Immunization History  Administered Date(s) Administered   Influenza, Seasonal, Injecte, Preservative Fre 07/17/2012   PFIZER(Purple Top)SARS-COV-2 Vaccination 07/26/2019, 08/20/2019, 04/15/2020   Pneumococcal Conjugate-13 06/11/2013   Pneumococcal Polysaccharide-23 08/17/2017   Zoster, Live 07/24/2014   There are no preventive care reminders to display for this patient.     Past Medical History:  Diagnosis Date   COPD (chronic obstructive pulmonary disease) ([place]) 05/21/2011   Diverticulosis of colon 05/21/2011   Hypothyroidism    Impaired glucose tolerance 06/08/2012   Increased prostate specific antigen (PSA) velocity 05/24/2011   Nonmelanoma skin cancer 05/21/2011   S/P appendectomy 05/21/2011   S/P inguinal hernia repair 05/21/2011   S/p small bowel obstruction 05/21/2011   Thoracic scoliosis 05/21/2011   Past Surgical History:  Procedure Laterality Date   APPENDECTOMY  1939   COLON RESECTION     2009 for a "kink in colon" adhesions after appendectomy    SHOULDER SURGERY     dr sypher; rotater cuff   STRABISMUS SURGERY Right 11/21/2014   Procedure: REPAIR STRABISMUS RIGHT EYE ;  Surgeon: Everitt Amber, MD;  Location: [facility];  Service: Ophthalmology;  Laterality: Right;   TONSILLECTOMY  1942    reports that he has never smoked. He has never used smokeless tobacco. He reports current alcohol use. He reports that he does not use drugs. family history includes Arthritis in an other family member; Sudden death in an other family member. Allergies  Allergen Reactions   Augmentin [Amoxicillin-Pot Clavulanate] Rash    Itching rash to all torso 2 days after start augmentin 12/2017   Current Outpatient Medications on File Prior to Visit  Medication Sig Dispense Refill   alfuzosin (UROXATRAL) 10 MG 24 hr tablet Take 1 tablet (10 mg total) by mouth daily with breakfast. 90 tablet 1   Ascorbic Acid (VITAMIN C WITH ROSE HIPS) 500 MG tablet Take 500 mg by mouth daily.     aspirin (SB LOW DOSE ASA EC) 81 MG EC tablet Take 1 tablet (81 mg total) by mouth daily. Swallow whole. 100 tablet 99   Cholecalciferol (VITAMIN D PO) Take 5,000 Units by mouth every morning.     Iron-FA-B Cmp-C-Biot-Probiotic (FUSION PLUS) CAPS Take 1 capsule by mouth daily. 90 capsule 1   levothyroxine (SYNTHROID) 50 MCG tablet TAKE 1 TABLET BY MOUTH DAILY BEFORE BREAKFAST 90 tablet  3   polyethylene glycol (MIRALAX / GLYCOLAX) 17 g packet Take 17 g by mouth daily.     rosuvastatin (CRESTOR) 10 MG tablet Take 1 tablet (10 mg total) by mouth daily. 90 tablet 3   selenium 50 MCG TABS tablet Take 50 mcg by mouth daily.     Specialty Vitamins Products (PROSTATE PO) Take by mouth daily.     thiamine (VITAMIN B-1) 100 MG tablet      thiamine 50 MG tablet Take 1 tablet (50 mg total) by mouth daily. 90 tablet 1   Zinc 50 MG TABS Take by mouth.     No current facility-administered medications on file prior to visit.        ROS:  All others reviewed and negative.  Objective         PE:  BP 116/76 (BP Location: Left Arm, Patient Position: Sitting, Cuff Size: Normal)    Pulse (!) 52    Temp 97.6 F (36.4 C) (Oral)    Ht 5\' 8"  (1.727 m)    Wt 128 lb (58.1 kg)    SpO2 98%    BMI 19.46 kg/m                 Constitutional: Pt appears in NAD               HENT: Head: NCAT.                Right Ear: External ear normal.                 Left Ear: External ear normal.                Eyes: . Pupils are equal, round, and reactive to light. Conjunctivae and EOM are normal               Nose: without d/c or deformity               Neck: Neck supple. Gross normal ROM               Cardiovascular: Normal rate and regular rhythm.                 Pulmonary/Chest: Effort normal and breath sounds without rales or wheezing.                Abd:  Soft, NT, ND, + BS, no organomegaly               Neurological: Pt is alert. At baseline orientation, motor grossly intact               Skin: Skin is warm. No rashes, no other new lesions, LE edema - none               Psychiatric: Pt behavior is normal without agitation   Micro: none  Cardiac tracings I have personally interpreted today:  none  Pertinent Radiological findings (summarize): none   Lab Results  Component Value Date   WBC 2.5 (L) 06/23/2021   HGB 12.7 (L) 06/23/2021   HCT 38.7 (L) 06/23/2021   PLT 95 (L) 06/23/2021   GLUCOSE 128 (H) 06/23/2021   CHOL 114 03/12/2021   TRIG 59.0 03/12/2021   HDL 60.80 03/12/2021   LDLCALC 41 03/12/2021   ALT 11 03/12/2021   AST 14 03/12/2021   NA 140 06/23/2021   K 4.0 06/23/2021   CL 105 06/23/2021   CREATININE 0.82 06/23/2021   BUN 16  06/23/2021   CO2 27 06/23/2021   TSH 3.49 03/12/2021   HGBA1C 5.5 03/12/2021   Assessment/Plan:  Jason Barton is a 88 y.o. White or Caucasian [1] male with  has a past medical history of COPD (chronic obstructive pulmonary disease) ([place]) (05/21/2011), Diverticulosis of colon (05/21/2011), Hypothyroidism, Impaired glucose tolerance  (06/08/2012), Increased prostate specific antigen (PSA) velocity (05/24/2011), Nonmelanoma skin cancer (05/21/2011), S/P appendectomy (05/21/2011), S/P inguinal hernia repair (05/21/2011), S/p small bowel obstruction (05/21/2011), and Thoracic scoliosis (05/21/2011).  Preventative health care Age and sex appropriate education and counseling updated with regular exercise and diet Referrals for preventative services - none needed Immunizations addressed - declines covid booster, shingrix Smoking counseling  - none needed Evidence for depression or other mood disorder - none significant Most recent labs reviewed. I have personally reviewed and have noted: 1) the patient's medical and social history 2) The patient's current medications and supplements 3) The patient's height, weight, and BMI have been recorded in the chart   Aortic atherosclerosis (HCC) Pt for low chol diet, exercise, cont crestor  Blood pressure elevated without history of HTN Improved, stable, cont current med tx  Hyperlipidemia Lab Results  Component Value Date   LDLCALC 41 03/12/2021   Stable, pt to continue current statin crestor   Hypothyroidism Lab Results  Component Value Date   TSH 3.49 03/12/2021   Stable, pt to continue levothyroxine   Impaired glucose tolerance Lab Results  Component Value Date   HGBA1C 5.5 03/12/2021   Stable, pt to continue current medical treatment  - diet  Followup: Return in about 6 months (around 01/10/2022).  Cathlean Cower, MD 07/13/2021 10:21 PM [facility]

## 2021-07-13 NOTE — Patient Instructions (Signed)
Please continue all other medications as before, and refills have been done if requested.  Please have the pharmacy call with any other refills you may need.  Please continue your efforts at being more active, low cholesterol diet, and weight control.  Please keep your appointments with your specialists as you may have planned  Please make an Appointment to return in 6 months, or sooner if needed 

## 2021-07-13 NOTE — Assessment & Plan Note (Signed)
Improved, stable, cont current med tx

## 2021-07-13 NOTE — Assessment & Plan Note (Addendum)
Pt for low chol diet, exercise, cont crestor

## 2021-07-13 NOTE — Assessment & Plan Note (Signed)
Lab Results  Component Value Date   TSH 3.49 03/12/2021   Stable, pt to continue levothyroxine

## 2021-07-14 ENCOUNTER — Other Ambulatory Visit: Payer: Medicare Other

## 2021-08-13 ENCOUNTER — Other Ambulatory Visit: Payer: Self-pay | Admitting: Internal Medicine

## 2021-08-13 NOTE — Telephone Encounter (Signed)
Please refill as per office routine med refill policy (all routine meds to be refilled for 3 mo or monthly (per pt preference) up to one year from last visit, then month to month grace period for 3 mo, then further med refills will have to be denied) ? ?

## 2021-08-15 IMAGING — DX DG RIBS 2V*L*
2 series · 2 of 2 positions shown · non-contrast
Comparison: Chest radiograph dated 01/17/2018.

CLINICAL DATA: [AGE] male with left lower rib pain. No known
injury.

EXAM:
LEFT RIBS - 2 VIEW; CHEST - 2 VIEW

[hemithorax (ribs) ap]
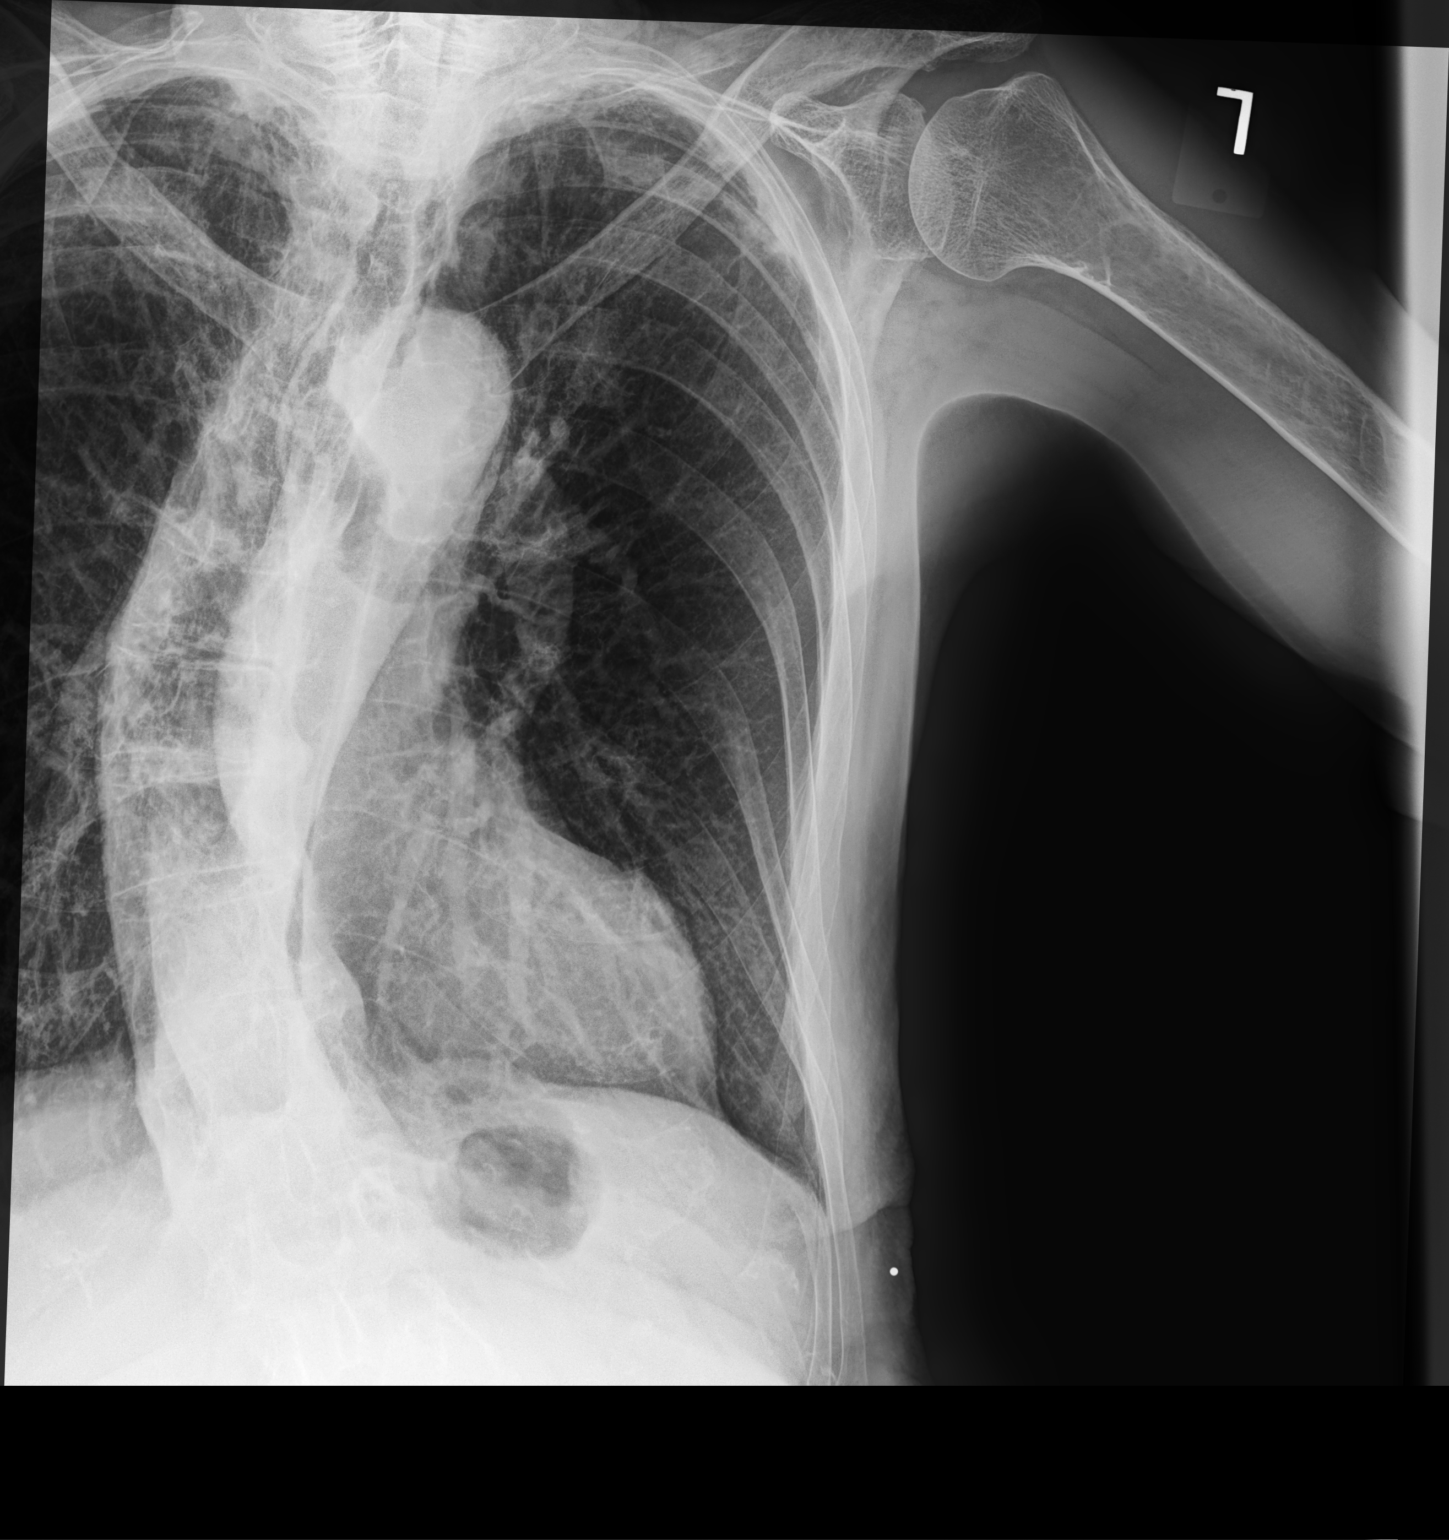

[hemithorax (ribs) mlo]
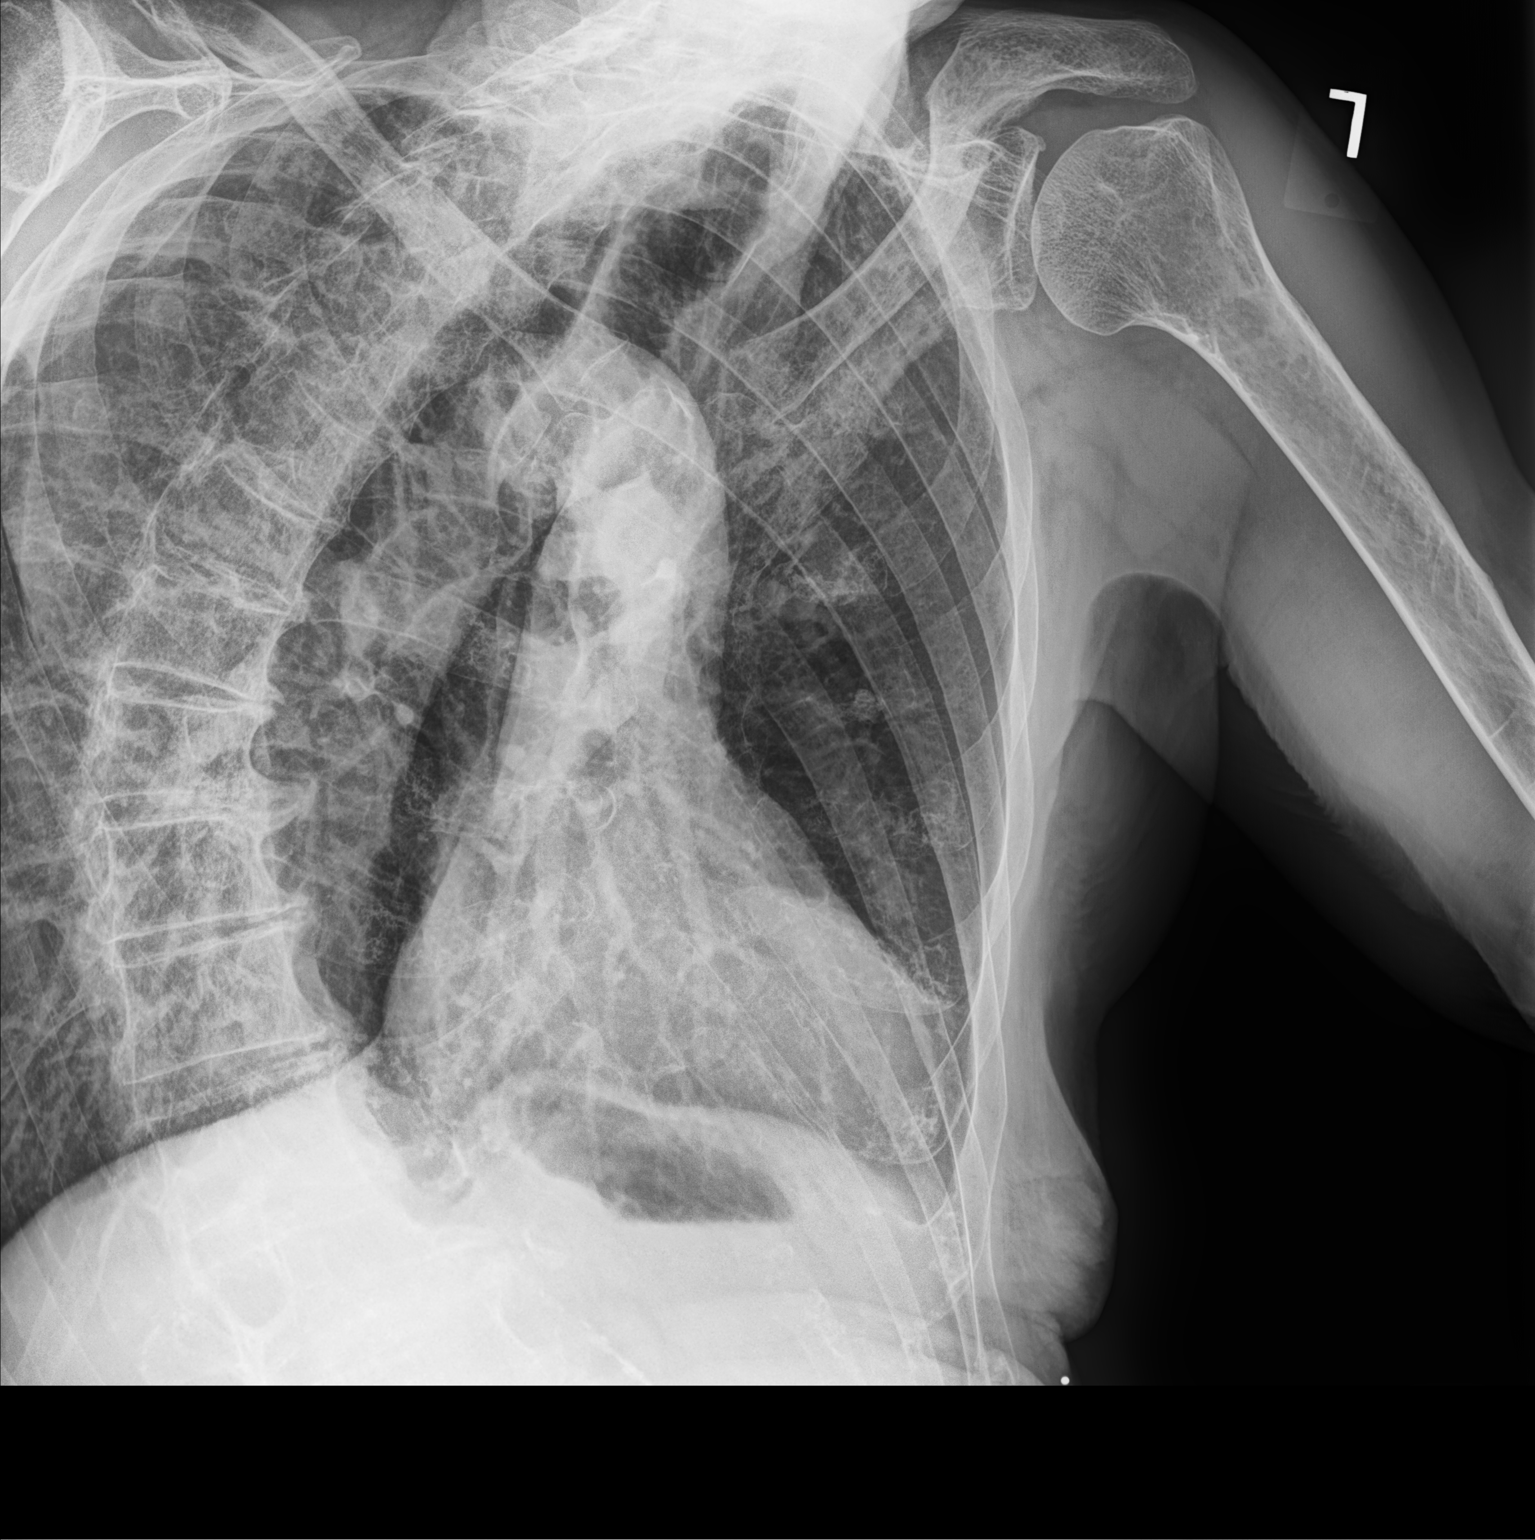

[2 of 2 positions shown; findings below may reference images not displayed]

FINDINGS: Background of emphysema. No focal consolidation, pleural effusion,
or pneumothorax. The cardiac silhouette is within normal limits.
Atherosclerotic calcification of the aortic arch. Osteopenia with
degenerative changes of spine. There is cortical discontinuity of
the lateral aspect of the left eighth rib concerning for a fracture.
IMPRESSION: 1. Probable nondisplaced fracture of the lateral left eighth rib. No
pneumothorax.
2. Emphysema.

## 2021-09-30 ENCOUNTER — Encounter: Payer: Self-pay | Admitting: Family Medicine

## 2021-09-30 ENCOUNTER — Ambulatory Visit: Payer: Medicare Other | Admitting: Family Medicine

## 2021-09-30 VITALS — BP 118/80 | HR 59 | Temp 97.6°F | Ht 68.0 in | Wt 128.2 lb

## 2021-09-30 DIAGNOSIS — S8991XA Unspecified injury of right lower leg, initial encounter: Secondary | ICD-10-CM | POA: Diagnosis not present

## 2021-09-30 DIAGNOSIS — S8010XA Contusion of unspecified lower leg, initial encounter: Secondary | ICD-10-CM

## 2021-09-30 NOTE — Progress Notes (Signed)
? ?Acute Office Visit ? ?Subjective:  ? ? Patient ID: Jason Barton, male    DOB: 11-Apr-1926, 88 y.o.   MRN: [id number] ? ?Chief Complaint  ?Patient presents with  ? Leg Injury  ?  Hit right leg on dishwasher and has a knot. Pt states it hasnt been giving too much trouble but daughter said he needed to have it checked because his father in law had a knot that led to a blood clot.   ? ? ?HPI ?Patient is in today for a knot on his right anterior lower leg. States he hit his leg 4-5 weeks ago on the lid of the dishwasher. Denies pain but the area is tender to palpation.  ?He is taking an ASA 81 mg but no other blood thinners.  ?He walks for exercise and has not noticed any pain with activity. Denies calf pain or tenderness.  ? ?No fever, chills, dizziness, chest pain, palpitations, shortness of breath, abdominal pain, N/V/D. No numbness, tingling or weakness.  ? ?Past Medical History:  ?Diagnosis Date  ? COPD (chronic obstructive pulmonary disease) ([place]) 05/21/2011  ? Diverticulosis of colon 05/21/2011  ? Hypothyroidism   ? Impaired glucose tolerance 06/08/2012  ? Increased prostate specific antigen (PSA) velocity 05/24/2011  ? Nonmelanoma skin cancer 05/21/2011  ? S/P appendectomy 05/21/2011  ? S/P inguinal hernia repair 05/21/2011  ? S/p small bowel obstruction 05/21/2011  ? Thoracic scoliosis 05/21/2011  ? ? ?Past Surgical History:  ?Procedure Laterality Date  ? APPENDECTOMY  1939  ? COLON RESECTION    ? 2009 for a "kink in colon" adhesions after appendectomy  ? SHOULDER SURGERY    ? dr sypher; rotater cuff  ? STRABISMUS SURGERY Right 11/21/2014  ? Procedure: REPAIR STRABISMUS RIGHT EYE ;  Surgeon: Everitt Amber, MD;  Location: [facility];  Service: Ophthalmology;  Laterality: Right;  ? TONSILLECTOMY  1942  ? ? ?Family History  ?Problem Relation Age of Onset  ? Arthritis Other   ? Sudden death Other   ? Colon cancer Neg Hx   ? Esophageal cancer Neg Hx   ? Rectal cancer Neg Hx   ? ? ?Social History   ? ?Socioeconomic History  ? Marital status: Married  ?  Spouse name: June  ? Number of children: 4  ? Years of education: MBA  ? Highest education level: Not on file  ?Occupational History  ? Occupation: retired  ?Tobacco Use  ? Smoking status: Never  ? Smokeless tobacco: Never  ?Vaping Use  ? Vaping Use: Never used  ?Substance and Sexual Activity  ? Alcohol use: Yes  ?  Comment: Rare  ? Drug use: No  ? Sexual activity: Not on file  ?Other Topics Concern  ? Not on file  ?Social History Narrative  ? Lives at home with wife  ? Right Handed  ? Drinks 1-2 cups caffeine daily  ? ?Social Determinants of Health  ? ?Financial Resource Strain: Low Risk   ? Difficulty of Paying Living Expenses: Not hard at all  ?Food Insecurity: No Food Insecurity  ? Worried About Charity fundraiser in the Last Year: Never true  ? Ran Out of Food in the Last Year: Never true  ?Transportation Needs: No Transportation Needs  ? Lack of Transportation (Medical): No  ? Lack of Transportation (Non-Medical): No  ?Physical Activity: Sufficiently Active  ? Days of Exercise per Week: 5 days  ? Minutes of Exercise per Session: 30 min  ?Stress: No Stress  Concern Present  ? Feeling of Stress : Not at all  ?Social Connections: Socially Integrated  ? Frequency of Communication with Friends and Family: More than three times a week  ? Frequency of Social Gatherings with Friends and Family: More than three times a week  ? Attends Religious Services: More than 4 times per year  ? Active Member of Clubs or Organizations: Yes  ? Attends Archivist Meetings: More than 4 times per year  ? Marital Status: Married  ?Intimate Partner Violence: Not At Risk  ? Fear of Current or Ex-Partner: No  ? Emotionally Abused: No  ? Physically Abused: No  ? Sexually Abused: No  ? ? ?Outpatient Medications Prior to Visit  ?Medication Sig Dispense Refill  ? alfuzosin (UROXATRAL) 10 MG 24 hr tablet Take 1 tablet (10 mg total) by mouth daily with breakfast. 90 tablet 1  ?  Ascorbic Acid (VITAMIN C WITH ROSE HIPS) 500 MG tablet Take 500 mg by mouth daily.    ? aspirin (SB LOW DOSE ASA EC) 81 MG EC tablet Take 1 tablet (81 mg total) by mouth daily. Swallow whole. 100 tablet 99  ? Cholecalciferol (VITAMIN D PO) Take 5,000 Units by mouth every morning.    ? Iron-FA-B Cmp-C-Biot-Probiotic (FUSION PLUS) CAPS Take 1 capsule by mouth daily. 90 capsule 1  ? levothyroxine (SYNTHROID) 50 MCG tablet TAKE 1 TABLET BY MOUTH DAILY BEFORE BREAKFAST 90 tablet 3  ? polyethylene glycol (MIRALAX / GLYCOLAX) 17 g packet Take 17 g by mouth daily.    ? rosuvastatin (CRESTOR) 10 MG tablet TAKE 1 TABLET BY MOUTH EVERY DAY 90 tablet 3  ? selenium 50 MCG TABS tablet Take 50 mcg by mouth daily.    ? Specialty Vitamins Products (PROSTATE PO) Take by mouth daily.    ? thiamine (VITAMIN B-1) 100 MG tablet     ? thiamine 50 MG tablet Take 1 tablet (50 mg total) by mouth daily. 90 tablet 1  ? Zinc 50 MG TABS Take by mouth.    ? ?No facility-administered medications prior to visit.  ? ? ?Allergies  ?Allergen Reactions  ? Augmentin [Amoxicillin-Pot Clavulanate] Rash  ?  Itching rash to all torso 2 days after start augmentin 12/2017  ? ? ?Review of Systems ?Pertinent positives and negatives in the history of present illness. ? ?   ?Objective:  ?  ?Physical Exam ? ?BP 118/80 (BP Location: Left Arm, Patient Position: Sitting, Cuff Size: Normal)   Pulse (!) 59   Temp 97.6 ?F (36.4 ?C) (Temporal)   Ht '5\' 8"'$  (1.727 m)   Wt 128 lb 3.2 oz (58.2 kg)   SpO2 98%   BMI 19.49 kg/m?  ?Wt Readings from Last 3 Encounters:  ?09/30/21 128 lb 3.2 oz (58.2 kg)  ?07/13/21 128 lb (58.1 kg)  ?04/06/21 133 lb (60.3 kg)  ? ? ? ?Right anterior lower leg with a raised, soft, compressable, slightly tender to palpation, hematoma. No surrounding erythema, edema, fluctuance or drainage. Right calf is soft, non tender and no edema. Normal sensation, cap refill, pulses, ROM and strength. Respirations are unlabored.  ? ? ?There are no preventive  care reminders to display for this patient. ? ?There are no preventive care reminders to display for this patient. ? ? ?Lab Results  ?Component Value Date  ? TSH 3.49 03/12/2021  ? ?Lab Results  ?Component Value Date  ? WBC 2.5 (L) 06/23/2021  ? HGB 12.7 (L) 06/23/2021  ? HCT 38.7 (L) 06/23/2021  ?  MCV 91.3 06/23/2021  ? PLT 95 (L) 06/23/2021  ? ?Lab Results  ?Component Value Date  ? NA 140 06/23/2021  ? K 4.0 06/23/2021  ? CO2 27 06/23/2021  ? GLUCOSE 128 (H) 06/23/2021  ? BUN 16 06/23/2021  ? CREATININE 0.82 06/23/2021  ? BILITOT 0.8 03/12/2021  ? ALKPHOS 74 03/12/2021  ? AST 14 03/12/2021  ? ALT 11 03/12/2021  ? PROT 6.3 03/12/2021  ? ALBUMIN 4.0 03/12/2021  ? CALCIUM 8.4 (L) 06/23/2021  ? ANIONGAP 8 06/23/2021  ? GFR 74.70 03/12/2021  ? ?Lab Results  ?Component Value Date  ? CHOL 114 03/12/2021  ? ?Lab Results  ?Component Value Date  ? HDL 60.80 03/12/2021  ? ?Lab Results  ?Component Value Date  ? [place] 41 03/12/2021  ? ?Lab Results  ?Component Value Date  ? TRIG 59.0 03/12/2021  ? ?Lab Results  ?Component Value Date  ? CHOLHDL 2 03/12/2021  ? ?Lab Results  ?Component Value Date  ? HGBA1C 5.5 03/12/2021  ? ? ?   ?Assessment & Plan:  ? ?Problem List Items Addressed This Visit   ?None ?Visit Diagnoses   ? ? Hematoma of lower leg    -  Primary  ? Injury of right lower extremity, initial encounter      ? ?  ? ?Dr. Sharlet Salina also examined patient and agrees that the area appears to be healing appropriately and will take more time. No sign of infection or obstruction. He may use heat compresses to help healing process and follow up as needed.  ? ?No orders of the defined types were placed in this encounter. ? ? ? ?Harland Dingwall, NP-C ? ?

## 2021-09-30 NOTE — Patient Instructions (Signed)
You can use a heat compress on the area to speed up the healing process.  ? ?The area should gradually resolve. If you see any signs of infection (increased redness, warmth, drainage, pain)  please follow up with [place].  ?

## 2021-10-05 ENCOUNTER — Ambulatory Visit: Payer: Medicare Other | Admitting: Neurology

## 2021-10-05 ENCOUNTER — Encounter: Payer: Self-pay | Admitting: Neurology

## 2021-10-05 VITALS — BP 162/91 | HR 55 | Ht 66.5 in | Wt 128.0 lb

## 2021-10-05 DIAGNOSIS — Z8673 Personal history of transient ischemic attack (TIA), and cerebral infarction without residual deficits: Secondary | ICD-10-CM | POA: Diagnosis not present

## 2021-10-05 DIAGNOSIS — R531 Weakness: Secondary | ICD-10-CM | POA: Diagnosis not present

## 2021-10-05 NOTE — Progress Notes (Signed)
?[facility] ?[address] ?[place]. [place] [address] ?[phone] ? ?     OFFICE FOLLOW UP VISIT NOTE ? ?[name] ?Date of Birth:  08/17/1925 ?Medical Record Number:  [id number]  ? ?Referring MD:  Cathlean Cower ? ?Reason for Referral:  TIA ? ?HPI: Initial visit 12/31/2020 :[name] is a 88 year old pleasant Caucasian male seen today for initial office consultation visit for TIA.  History is obtained from the patient and daughter as well as review of electronic medical records and I personally reviewed available imaging films in PACS.  He has past medical history of hypothyroidism, COPD, diverticulosis, scoliosis.  On 10/08/2020 we developed sudden onset of gait imbalance and a feeling of being pulled to the left while walking from his den towards the kitchen.  He was able to hold onto the kitchen counter after waiting a few minutes he was able to walk but felt he was slightly off balance.  He made it to the bedroom and lay down.  He felt slightly odd and different for about 30 minutes.  He denied any accompanying headache, blurred vision, slurred speech, double vision, vertigo, focal extremity weakness or numbness.  He denies any prior history of strokes or TIAs.  He subsequently had MRI scan of the brain done on 10/10/2020 which showed no acute abnormality.  Age-related changes of chronic small vessel disease and mild generalized atrophy were noted.  Carotid ultrasound was done as an outpatient and patient was told results were fine but I do not have the actual report.  Echocardiogram on 10/20/2020 showed ejection fraction of 60 to 65%.  Lab work on 11/09/2020 showed hemoglobin A1c of 5.5 and LDL cholesterol 54 mg percent.  Patient was started on aspirin 81 mg daily tolerating well with minor bruising and no bleeding.  He lives alone and is fairly independent with actives of daily living.  He is just sole caregiver for his wife who has Alzheimer's.  Patient has no new complaints  today.  His family history of stroke the patient has not himself had previous TIA or stroke ?` Update 04/06/2021 : He returns for follow-up after last visit 3 months ago.  Is accompanied by his daughter.  He states he is doing well.  He has had no recurrent TIA or stroke symptoms.  He remains on aspirin 81 mg with tolerating well with only minor bruising but no bleeding.  His blood pressure is under good control and today it is 138/89.  He is tolerating Crestor well without muscle aches and pains.  He had follow-up lipid profile checked on 03/12/2021 which showed LDL cholesterol 41 mg percent.  Hemoglobin A1c was 5.5.  I had ordered CT angiogram of the brain and neck but for unclear reason it has not yet been done.  He is living with his wife in their independent home and has close supervision and help from his daughter.  ?Update 10/05/2021 : ` He returns for follow-up after last visit 5 months ago.  Is accompanied by his daughter.  Patient has had no definite recurrent stroke or TIA symptoms however he had 2 brief episodes following Christmas when he woke up from sleep and felt weak all over.  He was unable to get out of bed and had to slide himself down.  He rested and this lasted about 10 minutes and he felt better after that.  He was seen in the ER for evaluation for these episodes on 06/23/2021 there is a discount head was  obtained which showed only age-related changes of small vessel disease.  CT angiogram of the brain and neck for further unremarkable except for moderate left supraclinoid internal carotid artery stenosis.  Remains on aspirin she is tolerating well with minor bruising but no bleeding.  He is also tolerating Crestor well without side effects.  His BP mostly tends to run low in the 110 120 range though today it is slightly elevated in office at 162/91.  Patient states he eats fairly well but has been losing weight for the last year or so.  He has not yet discussed this with his primary care  physician or had any work-up for it.  He has no other new complaints.  ?ROS:   ?14 system review of systems is positive for dizziness, gait imbalance , easy bruising and being pulled to 1 side, weight loss, weakness and all other systems negative ? ?PMH:  ?Past Medical History:  ?Diagnosis Date  ? COPD (chronic obstructive pulmonary disease) ([place]) 05/21/2011  ? Diverticulosis of colon 05/21/2011  ? Hypothyroidism   ? Impaired glucose tolerance 06/08/2012  ? Increased prostate specific antigen (PSA) velocity 05/24/2011  ? Nonmelanoma skin cancer 05/21/2011  ? S/P appendectomy 05/21/2011  ? S/P inguinal hernia repair 05/21/2011  ? S/p small bowel obstruction 05/21/2011  ? Thoracic scoliosis 05/21/2011  ? ? ?Social History:  ?Social History  ? ?Socioeconomic History  ? Marital status: Married  ?  Spouse name: Jason Barton  ? Number of children: 4  ? Years of education: MBA  ? Highest education level: Not on file  ?Occupational History  ? Occupation: retired  ?Tobacco Use  ? Smoking status: Never  ? Smokeless tobacco: Never  ?Vaping Use  ? Vaping Use: Never used  ?Substance and Sexual Activity  ? Alcohol use: Yes  ?  Comment: Rare  ? Drug use: No  ? Sexual activity: Not on file  ?Other Topics Concern  ? Not on file  ?Social History Narrative  ? Lives at home with wife  ? Right Handed  ? Drinks 1-2 cups caffeine daily  ? ?Social Determinants of Health  ? ?Financial Resource Strain: Low Risk   ? Difficulty of Paying Living Expenses: Not hard at all  ?Food Insecurity: No Food Insecurity  ? Worried About Charity fundraiser in the Last Year: Never true  ? Ran Out of Food in the Last Year: Never true  ?Transportation Needs: No Transportation Needs  ? Lack of Transportation (Medical): No  ? Lack of Transportation (Non-Medical): No  ?Physical Activity: Sufficiently Active  ? Days of Exercise per Week: 5 days  ? Minutes of Exercise per Session: 30 min  ?Stress: No Stress Concern Present  ? Feeling of Stress : Not at all  ?Social Connections:  Socially Integrated  ? Frequency of Communication with Friends and Family: More than three times a week  ? Frequency of Social Gatherings with Friends and Family: More than three times a week  ? Attends Religious Services: More than 4 times per year  ? Active Member of Clubs or Organizations: Yes  ? Attends Archivist Meetings: More than 4 times per year  ? Marital Status: Married  ?Intimate Partner Violence: Not At Risk  ? Fear of Current or Ex-Partner: No  ? Emotionally Abused: No  ? Physically Abused: No  ? Sexually Abused: No  ? ? ?Medications:   ?Current Outpatient Medications on File Prior to Visit  ?Medication Sig Dispense Refill  ? alfuzosin (UROXATRAL) 10  MG 24 hr tablet Take 1 tablet (10 mg total) by mouth daily with breakfast. 90 tablet 1  ? Ascorbic Acid (VITAMIN C WITH ROSE HIPS) 500 MG tablet Take 500 mg by mouth daily.    ? aspirin (SB LOW DOSE ASA EC) 81 MG EC tablet Take 1 tablet (81 mg total) by mouth daily. Swallow whole. 100 tablet 99  ? Cholecalciferol (VITAMIN D PO) Take 5,000 Units by mouth every morning.    ? Iron-FA-B Cmp-C-Biot-Probiotic (FUSION PLUS) CAPS Take 1 capsule by mouth daily. 90 capsule 1  ? levothyroxine (SYNTHROID) 50 MCG tablet TAKE 1 TABLET BY MOUTH DAILY BEFORE BREAKFAST 90 tablet 3  ? polyethylene glycol (MIRALAX / GLYCOLAX) 17 g packet Take 17 g by mouth daily.    ? rosuvastatin (CRESTOR) 10 MG tablet TAKE 1 TABLET BY MOUTH EVERY DAY 90 tablet 3  ? selenium 50 MCG TABS tablet Take 50 mcg by mouth daily.    ? Specialty Vitamins Products (PROSTATE PO) Take by mouth daily.    ? Zinc 50 MG TABS Take by mouth.    ? thiamine (VITAMIN B-1) 100 MG tablet  (Patient not taking: Reported on 10/05/2021)    ? thiamine 50 MG tablet Take 1 tablet (50 mg total) by mouth daily. (Patient not taking: Reported on 10/05/2021) 90 tablet 1  ? ?No current facility-administered medications on file prior to visit.  ? ? ?Allergies:   ?Allergies  ?Allergen Reactions  ? Augmentin  [Amoxicillin-Pot Clavulanate] Rash  ?  Itching rash to all torso 2 days after start augmentin 12/2017  ? ? ?Physical Exam ?General: Frail malnourished looking elderly Caucasian male, seated, in no evident distress ?Head: head no

## 2021-10-05 NOTE — Patient Instructions (Signed)
I had a long discussion with the patient and his daughter regarding his 2 brief episodes of generalized weakness of unclear etiology.  I advised him to eat healthy and to gain weight as well as maintain adequate hydration.  If his weight loss continues he needs to follow-up with his primary care physician to seek evaluation for that.  Continue aspirin for stroke prevention and maintain aggressive risk factor modification with strict control of hypertension blood pressure goal below 140/90, lipids with LDL cholesterol goal below 70 mg percent and diabetes with hemoglobin A1c goal below 6.5%.  He will return for follow-up in the future only as necessary and knows routine schedule appointment was made. ?

## 2021-11-03 NOTE — Progress Notes (Signed)
HPI: FU bradycardia.  Seen with possible TIA April 2022.  Also noted to have bradycardia with heart rate of 49.  Carotid Dopplers April 2022 showed near normal bilaterally.  Echocardiogram May 2022 showed normal LV function, grade 1 diastolic dysfunction, mild aortic insufficiency, mildly dilated aortic root at 41 mm.  Monitor June 2022 showed sinus rhythm with PACs, brief PAT, PVCs; lowest heart rate 38 at 5:54 AM.  No symptoms reported.  CTA January 2023 negative for large vessel occlusion.  There was a short segment of moderate stenosis at the supraclinoid left internal carotid artery.  Since last seen, patient denies dyspnea, chest pain, palpitations or syncope.  Current Outpatient Medications  Medication Sig Dispense Refill   alfuzosin (UROXATRAL) 10 MG 24 hr tablet Take 1 tablet (10 mg total) by mouth daily with breakfast. 90 tablet 1   Ascorbic Acid (VITAMIN C WITH ROSE HIPS) 500 MG tablet Take 500 mg by mouth daily.     aspirin (SB LOW DOSE ASA EC) 81 MG EC tablet Take 1 tablet (81 mg total) by mouth daily. Swallow whole. 100 tablet 99   Cholecalciferol (VITAMIN D PO) Take 5,000 Units by mouth every morning.     Iron-FA-B Cmp-C-Biot-Probiotic (FUSION PLUS) CAPS Take 1 capsule by mouth daily. 90 capsule 1   levothyroxine (SYNTHROID) 50 MCG tablet TAKE 1 TABLET BY MOUTH DAILY BEFORE BREAKFAST 90 tablet 3   polyethylene glycol (MIRALAX / GLYCOLAX) 17 g packet Take 17 g by mouth daily.     rosuvastatin (CRESTOR) 10 MG tablet TAKE 1 TABLET BY MOUTH EVERY DAY 90 tablet 3   selenium 50 MCG TABS tablet Take 50 mcg by mouth daily.     Specialty Vitamins Products (PROSTATE PO) Take by mouth daily.     thiamine (VITAMIN B-1) 100 MG tablet  (Patient not taking: Reported on 10/05/2021)     thiamine 50 MG tablet Take 1 tablet (50 mg total) by mouth daily. (Patient not taking: Reported on 10/05/2021) 90 tablet 1   Zinc 50 MG TABS Take by mouth.     No current facility-administered medications for  this visit.     Past Medical History:  Diagnosis Date   COPD (chronic obstructive pulmonary disease) ([place]) 05/21/2011   Diverticulosis of colon 05/21/2011   Hypothyroidism    Impaired glucose tolerance 06/08/2012   Increased prostate specific antigen (PSA) velocity 05/24/2011   Nonmelanoma skin cancer 05/21/2011   S/P appendectomy 05/21/2011   S/P inguinal hernia repair 05/21/2011   S/p small bowel obstruction 05/21/2011   Thoracic scoliosis 05/21/2011    Past Surgical History:  Procedure Laterality Date   APPENDECTOMY  1939   COLON RESECTION     2009 for a "kink in colon" adhesions after appendectomy   SHOULDER SURGERY     dr sypher; rotater cuff   STRABISMUS SURGERY Right 11/21/2014   Procedure: REPAIR STRABISMUS RIGHT EYE ;  Surgeon: Everitt Amber, MD;  Location: [facility];  Service: Ophthalmology;  Laterality: Right;   TONSILLECTOMY  1942    Social History   Socioeconomic History   Marital status: Married    Spouse name: June   Number of children: 4   Years of education: MBA   Highest education level: Not on file  Occupational History   Occupation: retired  Tobacco Use   Smoking status: Never   Smokeless tobacco: Never  Vaping Use   Vaping Use: Never used  Substance and Sexual Activity   Alcohol use:  Yes    Comment: Rare   Drug use: No   Sexual activity: Not on file  Other Topics Concern   Not on file  Social History Narrative   Lives at home with wife   Right Handed   Drinks 1-2 cups caffeine daily   Social Determinants of Health   Financial Resource Strain: Low Risk    Difficulty of Paying Living Expenses: Not hard at all  Food Insecurity: No Food Insecurity   Worried About Charity fundraiser in the Last Year: Never true   Arboriculturist in the Last Year: Never true  Transportation Needs: No Transportation Needs   Lack of Transportation (Medical): No   Lack of Transportation (Non-Medical): No  Physical Activity: Sufficiently Active    Days of Exercise per Week: 5 days   Minutes of Exercise per Session: 30 min  Stress: No Stress Concern Present   Feeling of Stress : Not at all  Social Connections: Socially Integrated   Frequency of Communication with Friends and Family: More than three times a week   Frequency of Social Gatherings with Friends and Family: More than three times a week   Attends Religious Services: More than 4 times per year   Active Member of [employer] or Organizations: Yes   Attends Music therapist: More than 4 times per year   Marital Status: Married  Human resources officer Violence: Not At Risk   Fear of Current or Ex-Partner: No   Emotionally Abused: No   Physically Abused: No   Sexually Abused: No    Family History  Problem Relation Age of Onset   Arthritis Other    Sudden death Other    Colon cancer Neg Hx    Esophageal cancer Neg Hx    Rectal cancer Neg Hx     ROS: no fevers or chills, productive cough, hemoptysis, dysphasia, odynophagia, melena, hematochezia, dysuria, hematuria, rash, seizure activity, orthopnea, PND, pedal edema, claudication. Remaining systems are negative.  Physical Exam: Well-developed well-nourished in no acute distress.  Skin is warm and dry.  HEENT is normal.  Neck is supple.  Chest is clear to auscultation with normal expansion.  Cardiovascular exam is irregular Abdominal exam nontender or distended. No masses palpated. Extremities show no edema. neuro grossly intact  ECG-sinus bradycardia at a rate of 57, occasional PAC, nonspecific ST changes.  Personally reviewed  A/P  1 bradycardia-patient has a history of sinus bradycardia but remains asymptomatic.  Previous monitor without prolonged pauses.  LV function is normal.  We will continue to follow.  Avoid AV nodal blocking agents.  2 hyperlipidemia-continue statin.  3 history of aortic atherosclerosis-continue statin.  4 history of TIA-continue aspirin and statin.  Kirk Ruths, MD

## 2021-11-08 ENCOUNTER — Other Ambulatory Visit: Payer: Self-pay | Admitting: Internal Medicine

## 2021-11-10 ENCOUNTER — Encounter: Payer: Self-pay | Admitting: Cardiology

## 2021-11-10 ENCOUNTER — Ambulatory Visit (INDEPENDENT_AMBULATORY_CARE_PROVIDER_SITE_OTHER): Payer: Medicare Other | Admitting: Cardiology

## 2021-11-10 VITALS — BP 122/68 | HR 60 | Ht 67.0 in | Wt 130.4 lb

## 2021-11-10 DIAGNOSIS — E7800 Pure hypercholesterolemia, unspecified: Secondary | ICD-10-CM

## 2021-11-10 DIAGNOSIS — R001 Bradycardia, unspecified: Secondary | ICD-10-CM

## 2021-11-10 DIAGNOSIS — I700 Atherosclerosis of aorta: Secondary | ICD-10-CM

## 2021-11-10 DIAGNOSIS — G459 Transient cerebral ischemic attack, unspecified: Secondary | ICD-10-CM

## 2021-11-10 NOTE — Patient Instructions (Signed)

## 2022-01-12 ENCOUNTER — Ambulatory Visit: Payer: Medicare Other | Admitting: Internal Medicine

## 2022-01-12 ENCOUNTER — Encounter: Payer: Self-pay | Admitting: Internal Medicine

## 2022-01-12 VITALS — BP 102/60 | HR 58 | Temp 97.8°F | Ht 67.0 in | Wt 127.0 lb

## 2022-01-12 DIAGNOSIS — E611 Iron deficiency: Secondary | ICD-10-CM

## 2022-01-12 DIAGNOSIS — R7302 Impaired glucose tolerance (oral): Secondary | ICD-10-CM | POA: Diagnosis not present

## 2022-01-12 DIAGNOSIS — F419 Anxiety disorder, unspecified: Secondary | ICD-10-CM | POA: Diagnosis not present

## 2022-01-12 DIAGNOSIS — E039 Hypothyroidism, unspecified: Secondary | ICD-10-CM | POA: Diagnosis not present

## 2022-01-12 DIAGNOSIS — E7800 Pure hypercholesterolemia, unspecified: Secondary | ICD-10-CM

## 2022-01-12 NOTE — Progress Notes (Signed)
Patient ID: Jason Barton, male   DOB: February 04, 1926, 88 y.o.   MRN: [id number]        Chief Complaint: follow up HLD and hyperglycemia , low thyroid, iron deficiency        HPI:  Jason Barton is a 88 y.o. male here overall doing ok, Pt denies chest pain, increased sob or doe, wheezing, orthopnea, PND, increased LE swelling, palpitations, dizziness or syncope.   Pt denies polydipsia, polyuria, or new focal neuro s/s.    Pt denies fever, wt loss, night sweats, loss of appetite, or other constitutional symptoms   Wife with dementia and doesn't want him away from her, no longer doing his waking 2 miles 5 days per wk.  Denies hyper or hypo thyroid symptoms such as voice, skin or hair change.   Wt Readings from Last 3 Encounters:  01/12/22 127 lb (57.6 kg)  11/10/21 130 lb 6.4 oz (59.1 kg)  10/05/21 128 lb (58.1 kg)   BP Readings from Last 3 Encounters:  01/12/22 102/60  11/10/21 122/68  10/05/21 (!) 162/91         Past Medical History:  Diagnosis Date   COPD (chronic obstructive pulmonary disease) ([place]) 05/21/2011   Diverticulosis of colon 05/21/2011   Hypothyroidism    Impaired glucose tolerance 06/08/2012   Increased prostate specific antigen (PSA) velocity 05/24/2011   Nonmelanoma skin cancer 05/21/2011   S/P appendectomy 05/21/2011   S/P inguinal hernia repair 05/21/2011   S/p small bowel obstruction 05/21/2011   Thoracic scoliosis 05/21/2011   Past Surgical History:  Procedure Laterality Date   APPENDECTOMY  1939   COLON RESECTION     2009 for a "kink in colon" adhesions after appendectomy   SHOULDER SURGERY     dr sypher; rotater cuff   STRABISMUS SURGERY Right 11/21/2014   Procedure: REPAIR STRABISMUS RIGHT EYE ;  Surgeon: Everitt Amber, MD;  Location: [facility];  Service: Ophthalmology;  Laterality: Right;   TONSILLECTOMY  1942    reports that he has never smoked. He has never used smokeless tobacco. He reports current alcohol use. He reports that he does not  use drugs. family history includes Arthritis in an other family member; Sudden death in an other family member. Allergies  Allergen Reactions   Augmentin [Amoxicillin-Pot Clavulanate] Rash    Itching rash to all torso 2 days after start augmentin 12/2017   Current Outpatient Medications on File Prior to Visit  Medication Sig Dispense Refill   alfuzosin (UROXATRAL) 10 MG 24 hr tablet Take 1 tablet (10 mg total) by mouth daily with breakfast. 90 tablet 1   Ascorbic Acid (VITAMIN C WITH ROSE HIPS) 500 MG tablet Take 500 mg by mouth daily.     ASPIRIN LOW DOSE 81 MG tablet TAKE 1 TABLET BY MOUTH DAILY. SWALLOW WHOLE. 100 tablet PRN   Cholecalciferol (VITAMIN D PO) Take 5,000 Units by mouth every morning.     Iron-FA-B Cmp-C-Biot-Probiotic (FUSION PLUS) CAPS Take 1 capsule by mouth daily. 90 capsule 1   levothyroxine (SYNTHROID) 50 MCG tablet TAKE 1 TABLET BY MOUTH DAILY BEFORE BREAKFAST 90 tablet 3   polyethylene glycol (MIRALAX / GLYCOLAX) 17 g packet Take 17 g by mouth daily.     rosuvastatin (CRESTOR) 10 MG tablet TAKE 1 TABLET BY MOUTH EVERY DAY 90 tablet 3   selenium 50 MCG TABS tablet Take 50 mcg by mouth daily.     Specialty Vitamins Products (PROSTATE PO) Take by mouth daily.  thiamine (VITAMIN B-1) 100 MG tablet      thiamine 50 MG tablet Take 1 tablet (50 mg total) by mouth daily. 90 tablet 1   Zinc 50 MG TABS Take by mouth.     No current facility-administered medications on file prior to visit.        ROS:  All others reviewed and negative.  Objective        PE:  BP 102/60 (BP Location: Right Arm, Patient Position: Sitting, Cuff Size: Normal)   Pulse (!) 58   Temp 97.8 F (36.6 C) (Oral)   Ht '5\' 7"'$  (1.702 m)   Wt 127 lb (57.6 kg)   SpO2 98%   BMI 19.89 kg/m                 Constitutional: Pt appears in NAD               HENT: Head: NCAT.                Right Ear: External ear normal.                 Left Ear: External ear normal.                Eyes: . Pupils are  equal, round, and reactive to light. Conjunctivae and EOM are normal               Nose: without d/c or deformity               Neck: Neck supple. Gross normal ROM               Cardiovascular: Normal rate and regular rhythm.                 Pulmonary/Chest: Effort normal and breath sounds without rales or wheezing.                Abd:  Soft, NT, ND, + BS, no organomegaly               Neurological: Pt is alert. At baseline orientation, motor grossly intact               Skin: Skin is warm. No rashes, no other new lesions, LE edema - none               Psychiatric: Pt behavior is normal without agitation   Micro: none  Cardiac tracings I have personally interpreted today:  none  Pertinent Radiological findings (summarize): none   Lab Results  Component Value Date   WBC 2.5 (L) 06/23/2021   HGB 12.7 (L) 06/23/2021   HCT 38.7 (L) 06/23/2021   PLT 95 (L) 06/23/2021   GLUCOSE 128 (H) 06/23/2021   CHOL 114 03/12/2021   TRIG 59.0 03/12/2021   HDL 60.80 03/12/2021   LDLCALC 41 03/12/2021   ALT 11 03/12/2021   AST 14 03/12/2021   NA 140 06/23/2021   K 4.0 06/23/2021   CL 105 06/23/2021   CREATININE 0.82 06/23/2021   BUN 16 06/23/2021   CO2 27 06/23/2021   TSH 3.49 03/12/2021   HGBA1C 5.5 03/12/2021   Assessment/Plan:  Jason Barton is a 88 y.o. White or Caucasian [1] male with  has a past medical history of COPD (chronic obstructive pulmonary disease) ([address]) (05/21/2011), Diverticulosis of colon (05/21/2011), Hypothyroidism, Impaired glucose tolerance (06/08/2012), Increased prostate specific antigen (PSA) velocity (05/24/2011), Nonmelanoma skin cancer (05/21/2011), S/P appendectomy (05/21/2011), S/P inguinal hernia repair (  05/21/2011), S/p small bowel obstruction (05/21/2011), and Thoracic scoliosis (05/21/2011).  Iron deficiency No overt bleeding, for f/u cbc and iron levels  Impaired glucose tolerance Lab Results  Component Value Date   HGBA1C 5.5 03/12/2021   Stable, pt to  continue current medical treatment  - diet, wt control, excercise   Hypothyroidism Lab Results  Component Value Date   TSH 3.49 03/12/2021   Stable, pt to continue levothyroxine 50 mcg qd  Hyperlipidemia Lab Results  Component Value Date   LDLCALC 41 03/12/2021   Stable, pt to continue current statin crestor 10 mg qd   Anxiety Mild worsening uncontrolled due to wife dementia, declines change in med tx or referral for counseling for now, but will call if changes his mind  Followup: No follow-ups on file.  Cathlean Cower, MD 01/14/2022 9:19 PM [facility]

## 2022-01-14 DIAGNOSIS — F419 Anxiety disorder, unspecified: Secondary | ICD-10-CM | POA: Insufficient documentation

## 2022-01-14 NOTE — Assessment & Plan Note (Signed)
Mild worsening uncontrolled due to wife dementia, declines change in med tx or referral for counseling for now, but will call if changes his mind

## 2022-01-14 NOTE — Assessment & Plan Note (Signed)
Lab Results  Component Value Date   LDLCALC 41 03/12/2021   Stable, pt to continue current statin crestor 10 mg qd

## 2022-01-14 NOTE — Assessment & Plan Note (Signed)
No overt bleeding, for f/u cbc and iron levels

## 2022-01-14 NOTE — Assessment & Plan Note (Signed)
Lab Results  Component Value Date   HGBA1C 5.5 03/12/2021   Stable, pt to continue current medical treatment  - diet, wt control, excercise

## 2022-01-14 NOTE — Assessment & Plan Note (Signed)
Lab Results  Component Value Date   TSH 3.49 03/12/2021   Stable, pt to continue levothyroxine 50 mcg qd

## 2022-01-14 NOTE — Patient Instructions (Signed)
Please continue all other medications as before, and refills have been done if requested.  Please have the pharmacy call with any other refills you may need.  Please continue your efforts at being more active, low cholesterol diet, and weight control.  Please keep your appointments with your specialists as you may have planned     

## 2022-03-18 IMAGING — DX DG LUMBAR SPINE COMPLETE 4+V
5 series · 5 of 5 positions shown · non-contrast
Comparison: Chest radiograph-09/26/2019

CLINICAL DATA: Chronic back pain.

EXAM:
LUMBAR SPINE - COMPLETE 4+ VIEW

[l-spine ap]
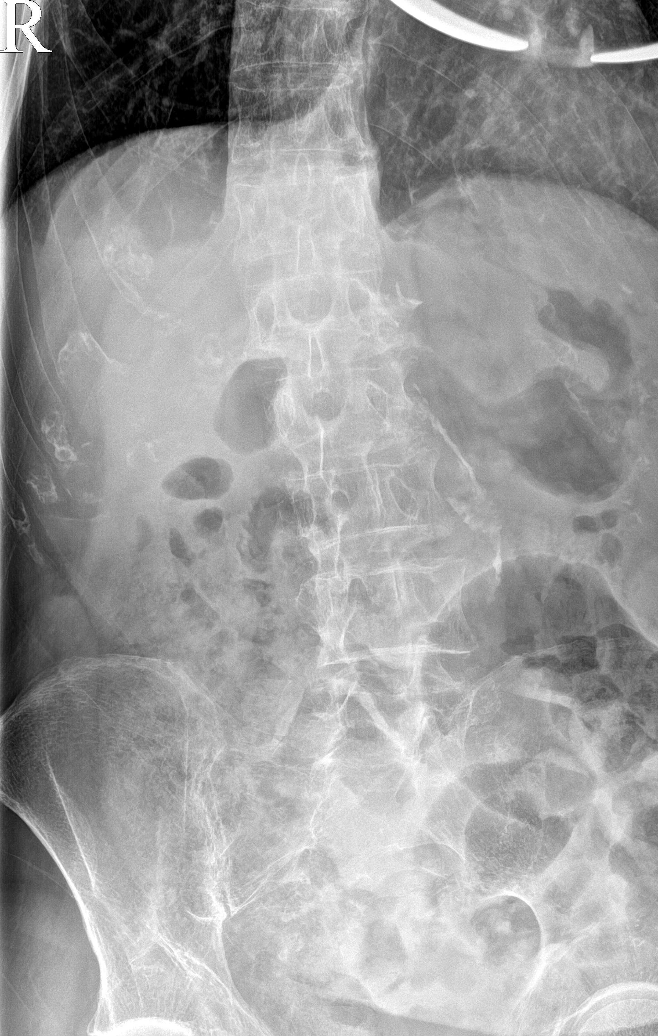

[l-spine obl (1 of 2)]
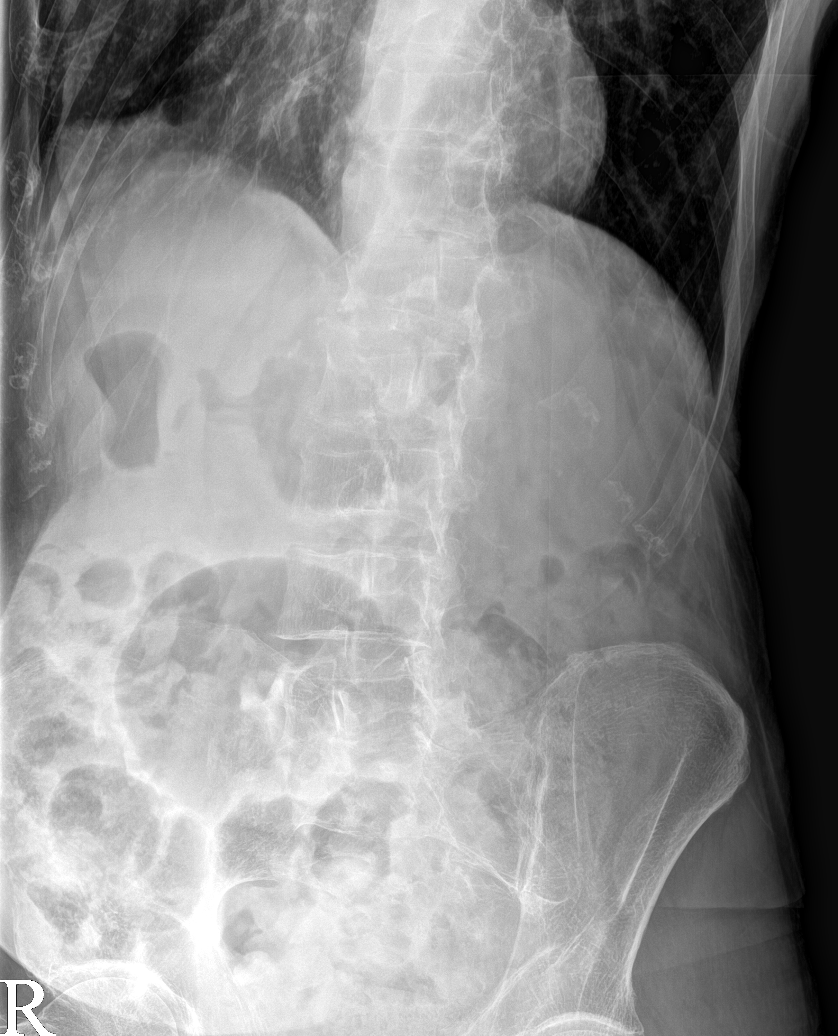

[l-spine obl (2 of 2)]
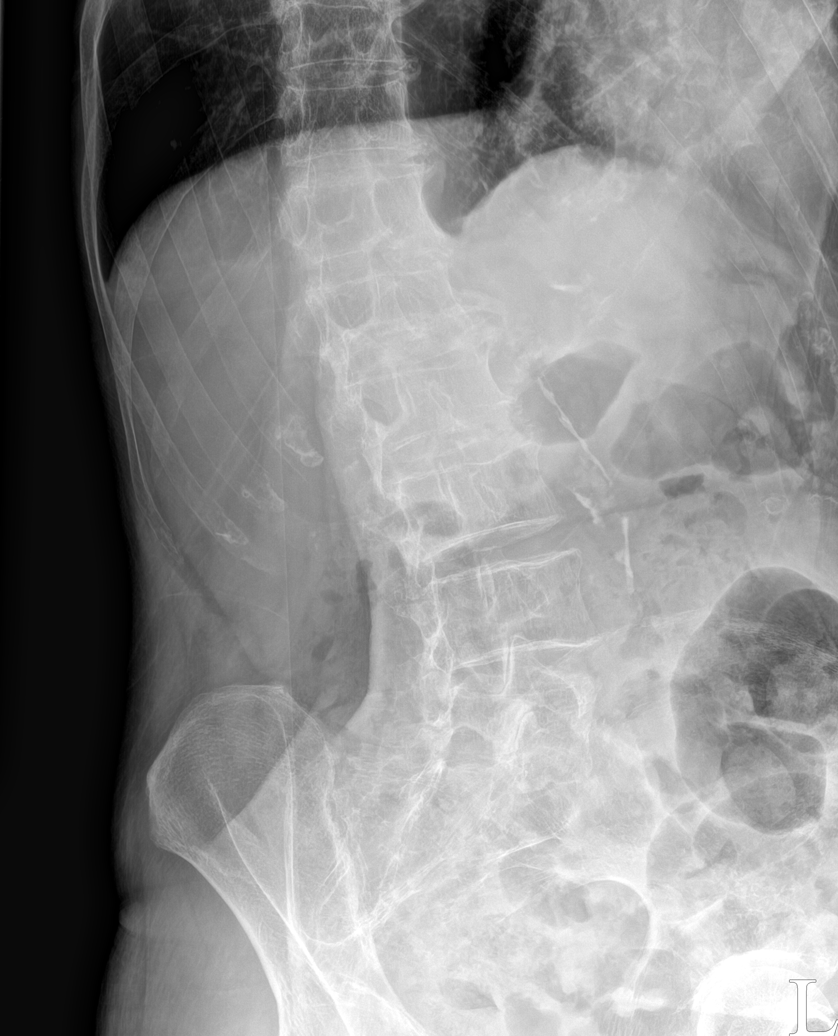

[l-spine lateral]
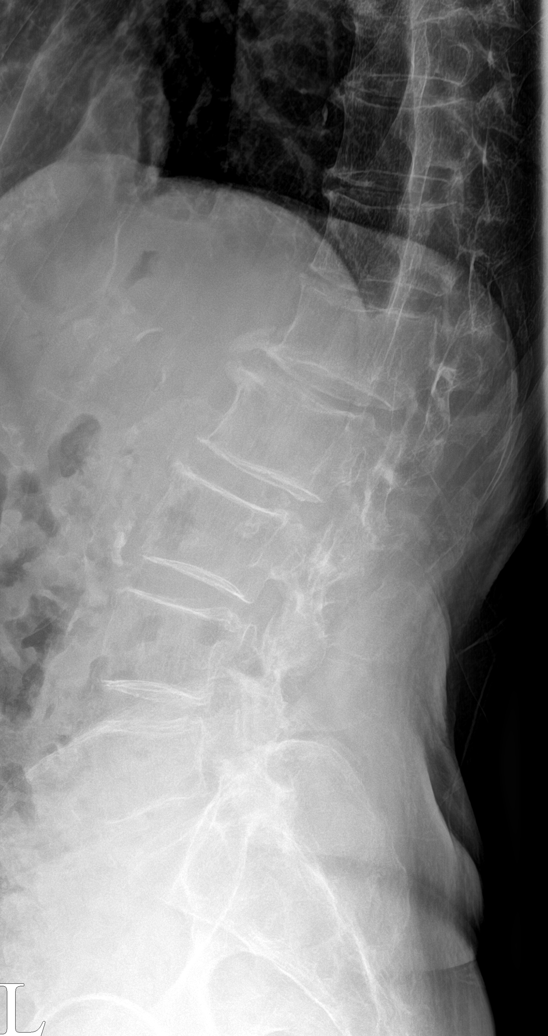

[l-spine spot]
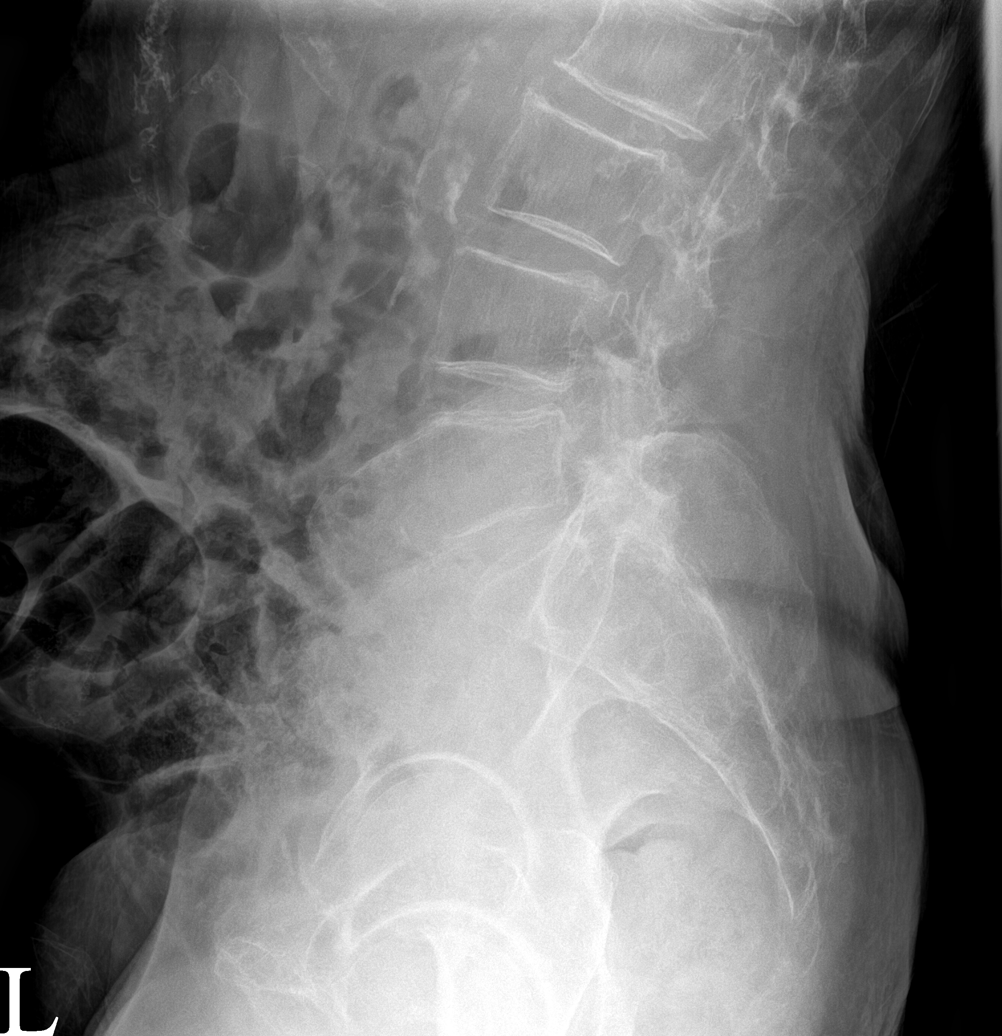

[5 of 5 positions shown; findings below may reference images not displayed]

FINDINGS: There are 5 non rib-bearing lumbar type vertebral bodies

Mild scoliotic curvature of the thoracolumbar spine with dominant
caudal component convex to the left measuring approximately 4
degrees (as measured from the superior endplate of T11 to the
inferior endplate of L4). There is minimal (approximately 6 mm) of
retrolisthesis of L1 upon L2. No anterolisthesis. No definite pars
defects.

Lumbar vertebral body heights appear preserved.

Mild to moderate multilevel lumbar spine DDD, worse at L1-L2 with
disc space height loss, endplate irregularity and sclerosis.

Limited visualization the bilateral SI joints is normal.
Atherosclerotic plaque within a tortuous abdominal aorta. Large
colonic stool burden without evidence of enteric obstruction.
Limited visualization of lower thorax demonstrates mild
elevation/eventration of the right hemidiaphragm.
IMPRESSION: 1. Mild-to-moderate multilevel lumbar spine DDD, worse at L1-L2.
2.  Aortic Atherosclerosis ([id number]).

## 2022-03-28 ENCOUNTER — Other Ambulatory Visit: Payer: Self-pay | Admitting: Internal Medicine

## 2022-03-28 NOTE — Telephone Encounter (Signed)
Please refill as per office routine med refill policy (all routine meds to be refilled for 3 mo or monthly (per pt preference) up to one year from last visit, then month to month grace period for 3 mo, then further med refills will have to be denied) ? ?

## 2022-04-18 NOTE — Progress Notes (Signed)
Subjective:   Jason Barton is a 88 y.o. male who presents for Medicare Annual/Subsequent preventive examination. I connected with  Langley Adie on 04/19/22 by a audio enabled telemedicine application and verified that I am speaking with the correct person using two identifiers.  Patient Location: Home  Provider Location: Home Office  I discussed the limitations of evaluation and management by telemedicine. The patient expressed understanding and agreed to proceed.  Review of Systems    Deferred to PCP Cardiac Risk Factors include: advanced age (>32men, >41 women);male gender     Objective:    There were no vitals filed for this visit. There is no height or weight on file to calculate BMI.     04/19/2022   11:12 AM 06/23/2021    6:55 PM 06/23/2021    2:18 PM 12/03/2020    2:04 PM 02/07/2020    4:40 PM 11/01/2019   12:04 PM 08/16/2016   10:38 AM  Advanced Directives  Does Patient Have a Medical Advance Directive? Yes Yes Yes Yes Yes Yes Yes  Type of Estate agent of [place];Living will Living will;Healthcare Power of Attorney Living will;Healthcare Power of Attorney Living will;Healthcare Power of [employer] Power of [place];Living will Healthcare Power of [place];Living will Living will  Does patient want to make changes to medical advance directive?  No - Patient declined  No - Guardian declined Yes (ED - Information included in AVS) No - Patient declined   Copy of Healthcare Power of Attorney in Chart? No - copy requested Yes - validated most recent copy scanned in chart (See row information)  No - copy requested  No - copy requested   Would patient like information on creating a medical advance directive?  No - Patient declined         Current Medications (verified) Outpatient Encounter Medications as of 04/19/2022  Medication Sig   alfuzosin (UROXATRAL) 10 MG 24 hr tablet Take 1 tablet (10 mg total) by mouth daily with breakfast.    Ascorbic Acid (VITAMIN C WITH ROSE HIPS) 500 MG tablet Take 500 mg by mouth daily.   ASPIRIN LOW DOSE 81 MG tablet TAKE 1 TABLET BY MOUTH DAILY. SWALLOW WHOLE.   Cholecalciferol (VITAMIN D PO) Take 5,000 Units by mouth every morning.   levothyroxine (SYNTHROID) 50 MCG tablet TAKE 1 TABLET BY MOUTH EVERY DAY BEFORE BREAKFAST   polyethylene glycol (MIRALAX / GLYCOLAX) 17 g packet Take 17 g by mouth daily.   rosuvastatin (CRESTOR) 10 MG tablet TAKE 1 TABLET BY MOUTH EVERY DAY   selenium 50 MCG TABS tablet Take 50 mcg by mouth daily.   Specialty Vitamins Products (PROSTATE PO) Take by mouth daily.   thiamine (VITAMIN B-1) 100 MG tablet    thiamine 50 MG tablet Take 1 tablet (50 mg total) by mouth daily.   Zinc 50 MG TABS Take by mouth.   Iron-FA-B Cmp-C-Biot-Probiotic (FUSION PLUS) CAPS Take 1 capsule by mouth daily. (Patient not taking: Reported on 04/19/2022)   No facility-administered encounter medications on file as of 04/19/2022.    Allergies (verified) Augmentin [amoxicillin-pot clavulanate]   History: Past Medical History:  Diagnosis Date   COPD (chronic obstructive pulmonary disease) ([facility]) 05/21/2011   Diverticulosis of colon 05/21/2011   Hypothyroidism    Impaired glucose tolerance 06/08/2012   Increased prostate specific antigen (PSA) velocity 05/24/2011   Nonmelanoma skin cancer 05/21/2011   S/P appendectomy 05/21/2011   S/P inguinal hernia repair 05/21/2011   S/p small bowel  obstruction 05/21/2011   Thoracic scoliosis 05/21/2011   Past Surgical History:  Procedure Laterality Date   APPENDECTOMY  1939   COLON RESECTION     2009 for a "kink in colon" adhesions after appendectomy   SHOULDER SURGERY     dr sypher; rotater cuff   STRABISMUS SURGERY Right 11/21/2014   Procedure: REPAIR STRABISMUS RIGHT EYE ;  Surgeon: Verne Carrow, MD;  Location: [facility];  Service: Ophthalmology;  Laterality: Right;   TONSILLECTOMY  1942   Family History  Problem Relation Age  of Onset   Arthritis Other    Sudden death Other    Colon cancer Neg Hx    Esophageal cancer Neg Hx    Rectal cancer Neg Hx    Social History   Socioeconomic History   Marital status: Married    Spouse name: June   Number of children: 4   Years of education: MBA   Highest education level: Not on file  Occupational History   Occupation: retired  Tobacco Use   Smoking status: Never   Smokeless tobacco: Never  Building services engineer Use: Never used  Substance and Sexual Activity   Alcohol use: Yes    Comment: Rare   Drug use: No   Sexual activity: Never  Other Topics Concern   Not on file  Social History Narrative   Lives at home with wife   Right Handed   Drinks 1-2 cups caffeine daily   Social Determinants of Health   Financial Resource Strain: Low Risk  (04/19/2022)   Overall Financial Resource Strain (CARDIA)    Difficulty of Paying Living Expenses: Not hard at all  Food Insecurity: No Food Insecurity (04/19/2022)   Hunger Vital Sign    Worried About Running Out of Food in the Last Year: Never true    Ran Out of Food in the Last Year: Never true  Transportation Needs: No Transportation Needs (04/19/2022)   PRAPARE - Administrator, Civil Service (Medical): No    Lack of Transportation (Non-Medical): No  Physical Activity: Insufficiently Active (04/19/2022)   Exercise Vital Sign    Days of Exercise per Week: 3 days    Minutes of Exercise per Session: 30 min  Stress: No Stress Concern Present (04/19/2022)   [employer] of Occupational Health - Occupational Stress Questionnaire    Feeling of Stress : Only a little  Social Connections: Socially Integrated (04/19/2022)   Social Connection and Isolation Panel [NHANES]    Frequency of Communication with Friends and Family: More than three times a week    Frequency of Social Gatherings with Friends and Family: More than three times a week    Attends Religious Services: 1 to 4 times per year    Active  Member of [employer] or Organizations: Yes    Attends Banker Meetings: 1 to 4 times per year    Marital Status: Married    Tobacco Counseling Counseling given: Not Answered   Clinical Intake:  Pre-visit preparation completed: Yes  Pain : No/denies pain     Nutritional Status: BMI <19  Underweight Diabetes: No  How often do you need to have someone help you when you read instructions, pamphlets, or other written materials from your doctor or pharmacy?: 1 - Never What is the last grade level you completed in school?: college  Diabetic?No  Interpreter Needed?: No  Information entered by :: Blanchie Serve RN   Activities of Daily Living  04/19/2022   11:07 AM  In your present state of health, do you have any difficulty performing the following activities:  Hearing? 0  Vision? 0  Difficulty concentrating or making decisions? 0  Walking or climbing stairs? 0  Dressing or bathing? 0  Doing errands, shopping? 0  Preparing Food and eating ? N  Using the Toilet? N  In the past six months, have you accidently leaked urine? N  Do you have problems with loss of bowel control? N  Managing your Medications? N  Managing your Finances? N  Housekeeping or managing your Housekeeping? N    Patient Care Team: Corwin Levins, MD as PCP - General (Internal Medicine)  Indicate any recent Medical Services you may have received from other than [facility] providers in the past year (date may be approximate).     Assessment:   This is a routine wellness examination for Jason Barton.  Hearing/Vision screen No results found.  Dietary issues and exercise activities discussed: Current Exercise Habits: Home exercise routine, Type of exercise: walking, Time (Minutes): 30, Frequency (Times/Week): 5, Weekly Exercise (Minutes/Week): 150, Intensity: Mild, Exercise limited by: None identified   Goals Addressed             This Visit's Progress    Patient Stated       Maintain my current  health status.      Depression Screen    04/19/2022   11:24 AM 09/30/2021   10:55 AM 12/03/2020    2:12 PM 11/09/2020   11:25 AM 11/01/2019   12:05 PM 08/22/2019   11:04 AM 06/10/2019    4:30 PM  PHQ 2/9 Scores  PHQ - 2 Score 1 0 0 0 0 0 0    Fall Risk    04/19/2022   11:14 AM 09/30/2021   10:55 AM 12/03/2020    2:06 PM 11/09/2020   11:25 AM 11/01/2019   12:04 PM  Fall Risk   Falls in the past year? 0 0 0 0 1  Number falls in past yr: 0 0 0 0 0  Injury with Fall? 0 0 0 0 0  Risk for fall due to : No Fall Risks No Fall Risks No Fall Risks  No Fall Risks  Follow up Falls evaluation completed Falls evaluation completed Falls evaluation completed  Falls evaluation completed;Falls prevention discussed    FALL RISK PREVENTION PERTAINING TO THE HOME:  Any stairs in or around the home? Yes  If so, are there any without handrails? Yes  Home free of loose throw rugs in walkways, pet beds, electrical cords, etc? Yes  Adequate lighting in your home to reduce risk of falls? Yes   ASSISTIVE DEVICES UTILIZED TO PREVENT FALLS:  Life alert? No  Use of a cane, walker or w/c? No  Grab bars in the bathroom? No  Shower chair or bench in shower? No  Elevated toilet seat or a handicapped toilet? No   Cognitive Function:        04/19/2022   11:15 AM 11/01/2019   12:23 PM  6CIT Screen  What Year? 0 points 0 points  What month? 0 points 0 points  What time? 0 points 0 points  Count back from 20 0 points 0 points  Months in reverse 0 points 0 points  Repeat phrase 0 points 0 points  Total Score 0 points 0 points    Immunizations Immunization History  Administered Date(s) Administered   Influenza, Seasonal, Injecte, Preservative Fre 07/17/2012   PFIZER(Purple Top)SARS-COV-2  Vaccination 07/26/2019, 08/20/2019, 04/15/2020   Pneumococcal Conjugate-13 06/11/2013   Pneumococcal Polysaccharide-23 08/17/2017   Zoster, Live 07/24/2014    TDAP status: Up to date  Flu Vaccine status:  Declined, Education has been provided regarding the importance of this vaccine but patient still declined. Advised may receive this vaccine at local pharmacy or Health Dept. Aware to provide a copy of the vaccination record if obtained from local pharmacy or Health Dept. Verbalized acceptance and understanding.  Pneumococcal vaccine status: Up to date  Covid-19 vaccine status: Information provided on how to obtain vaccines.   Qualifies for Shingles Vaccine? Yes   Zostavax completed No   Shingrix Completed?: No.    Education has been provided regarding the importance of this vaccine. Patient has been advised to call insurance company to determine out of pocket expense if they have not yet received this vaccine. Advised may also receive vaccine at local pharmacy or Health Dept. Verbalized acceptance and understanding.  Screening Tests Health Maintenance  Topic Date Due   Zoster Vaccines- Shingrix (1 of 2) Never done   Medicare Annual Wellness (AWV)  04/20/2023   TETANUS/TDAP  08/16/2026   Pneumonia Vaccine 33+ Years old  Completed   HPV VACCINES  Aged Out   INFLUENZA VACCINE  Discontinued   COVID-19 Vaccine  Discontinued    Health Maintenance  Health Maintenance Due  Topic Date Due   Zoster Vaccines- Shingrix (1 of 2) Never done    Colorectal cancer screening: No longer required.   Lung Cancer Screening: (Low Dose CT Chest recommended if Age 21-80 years, 30 pack-year currently smoking OR have quit w/in 15years.) does not qualify.   Additional Screening:  Hepatitis C Screening: does qualify  Vision Screening: Recommended annual ophthalmology exams for early detection of glaucoma and other disorders of the eye. Is the patient up to date with their annual eye exam?  Yes  Who is the provider or what is the name of the office in which the patient attends annual eye exams? Dr. Charlotte Sanes If pt is not established with a provider, would they like to be referred to a provider to establish  care?  N/A .   Dental Screening: Recommended annual dental exams for proper oral hygiene  Community Resource Referral / Chronic Care Management: CRR required this visit?  No   CCM required this visit?  No      Plan:     I have personally reviewed and noted the following in the patient's chart:   Medical and social history Use of alcohol, tobacco or illicit drugs  Current medications and supplements including opioid prescriptions. Patient is not currently taking opioid prescriptions. Functional ability and status Nutritional status Physical activity Advanced directives List of other physicians Hospitalizations, surgeries, and ER visits in previous 12 months Vitals Screenings to include cognitive, depression, and falls Referrals and appointments  In addition, I have reviewed and discussed with patient certain preventive protocols, quality metrics, and best practice recommendations. A written personalized care plan for preventive services as well as general preventive health recommendations were provided to patient.     Wanda Plump, RN   04/19/2022   Nurse Notes:  Jason Barton , Thank you for taking time to come for your Medicare Wellness Visit. I appreciate your ongoing commitment to your health goals. Please review the following plan we discussed and let me know if I can assist you in the future.   These are the goals we discussed:  Goals      <  enter goal here>     Maintain healthy life     Patient Stated     To maintain my current health status by continuing to eat healthy and stay physically active & socially active.     Patient Stated     Maintain my current health status.        This is a list of the screening recommended for you and due dates:  Health Maintenance  Topic Date Due   Zoster (Shingles) Vaccine (1 of 2) Never done   Medicare Annual Wellness Visit  04/20/2023   Tetanus Vaccine  08/16/2026   Pneumonia Vaccine  Completed   HPV Vaccine  Aged  Out   Flu Shot  Discontinued   COVID-19 Vaccine  Discontinued

## 2022-04-18 NOTE — Patient Instructions (Signed)
Health Maintenance, Male Adopting a healthy lifestyle and getting preventive care are important in promoting health and wellness. Ask your health care provider about: The right schedule for you to have regular tests and exams. Things you can do on your own to prevent diseases and keep yourself healthy. What should I know about diet, weight, and exercise? Eat a healthy diet  Eat a diet that includes plenty of vegetables, fruits, low-fat dairy products, and lean protein. Do not eat a lot of foods that are high in solid fats, added sugars, or sodium. Maintain a healthy weight Body mass index (BMI) is a measurement that can be used to identify possible weight problems. It estimates body fat based on height and weight. Your health care provider can help determine your BMI and help you achieve or maintain a healthy weight. Get regular exercise Get regular exercise. This is one of the most important things you can do for your health. Most adults should: Exercise for at least 150 minutes each week. The exercise should increase your heart rate and make you sweat (moderate-intensity exercise). Do strengthening exercises at least twice a week. This is in addition to the moderate-intensity exercise. Spend less time sitting. Even light physical activity can be beneficial. Watch cholesterol and blood lipids Have your blood tested for lipids and cholesterol at 86 years of age, then have this test every 5 years. You may need to have your cholesterol levels checked more often if: Your lipid or cholesterol levels are high. You are older than 86 years of age. You are at high risk for heart disease. What should I know about cancer screening? Many types of cancers can be detected early and may often be prevented. Depending on your health history and family history, you may need to have cancer screening at various ages. This may include screening for: Colorectal cancer. Prostate cancer. Skin cancer. Lung  cancer. What should I know about heart disease, diabetes, and high blood pressure? Blood pressure and heart disease High blood pressure causes heart disease and increases the risk of stroke. This is more likely to develop in people who have high blood pressure readings or are overweight. Talk with your health care provider about your target blood pressure readings. Have your blood pressure checked: Every 3-5 years if you are 18-39 years of age. Every year if you are 40 years old or older. If you are between the ages of 65 and 75 and are a current or former smoker, ask your health care provider if you should have a one-time screening for abdominal aortic aneurysm (AAA). Diabetes Have regular diabetes screenings. This checks your fasting blood sugar level. Have the screening done: Once every three years after age 45 if you are at a normal weight and have a low risk for diabetes. More often and at a younger age if you are overweight or have a high risk for diabetes. What should I know about preventing infection? Hepatitis B If you have a higher risk for hepatitis B, you should be screened for this virus. Talk with your health care provider to find out if you are at risk for hepatitis B infection. Hepatitis C Blood testing is recommended for: Everyone born from 1945 through 1965. Anyone with known risk factors for hepatitis C. Sexually transmitted infections (STIs) You should be screened each year for STIs, including gonorrhea and chlamydia, if: You are sexually active and are younger than 86 years of age. You are older than 86 years of age and your   health care provider tells you that you are at risk for this type of infection. Your sexual activity has changed since you were last screened, and you are at increased risk for chlamydia or gonorrhea. Ask your health care provider if you are at risk. Ask your health care provider about whether you are at high risk for HIV. Your health care provider  may recommend a prescription medicine to help prevent HIV infection. If you choose to take medicine to prevent HIV, you should first get tested for HIV. You should then be tested every 3 months for as long as you are taking the medicine. Follow these instructions at home: Alcohol use Do not drink alcohol if your health care provider tells you not to drink. If you drink alcohol: Limit how much you have to 0-2 drinks a day. Know how much alcohol is in your drink. In the [place] one drink equals one 12 oz bottle of beer (355 mL), one 5 oz glass of Cindia Hustead (148 mL), or one 1 oz glass of hard liquor (44 mL). Lifestyle Do not use any products that contain nicotine or tobacco. These products include cigarettes, chewing tobacco, and vaping devices, such as e-cigarettes. If you need help quitting, ask your health care provider. Do not use street drugs. Do not share needles. Ask your health care provider for help if you need support or information about quitting drugs. General instructions Schedule regular health, dental, and eye exams. Stay current with your vaccines. Tell your health care provider if: You often feel depressed. You have ever been abused or do not feel safe at home. Summary Adopting a healthy lifestyle and getting preventive care are important in promoting health and wellness. Follow your health care provider's instructions about healthy diet, exercising, and getting tested or screened for diseases. Follow your health care provider's instructions on monitoring your cholesterol and blood pressure. This information is not intended to replace advice given to you by your health care provider. Make sure you discuss any questions you have with your health care provider. Document Revised: 10/26/2020 Document Reviewed: 10/26/2020 Elsevier Patient Education  2023 Elsevier Inc.  

## 2022-04-19 ENCOUNTER — Ambulatory Visit (INDEPENDENT_AMBULATORY_CARE_PROVIDER_SITE_OTHER): Payer: Medicare Other | Admitting: *Deleted

## 2022-04-19 DIAGNOSIS — Z0000 Encounter for general adult medical examination without abnormal findings: Secondary | ICD-10-CM | POA: Diagnosis not present

## 2022-07-12 ENCOUNTER — Other Ambulatory Visit (INDEPENDENT_AMBULATORY_CARE_PROVIDER_SITE_OTHER): Payer: Medicare Other

## 2022-07-12 DIAGNOSIS — E039 Hypothyroidism, unspecified: Secondary | ICD-10-CM

## 2022-07-12 DIAGNOSIS — E7800 Pure hypercholesterolemia, unspecified: Secondary | ICD-10-CM | POA: Diagnosis not present

## 2022-07-12 DIAGNOSIS — R7302 Impaired glucose tolerance (oral): Secondary | ICD-10-CM | POA: Diagnosis not present

## 2022-07-12 DIAGNOSIS — E611 Iron deficiency: Secondary | ICD-10-CM | POA: Diagnosis not present

## 2022-07-12 LAB — HEPATIC FUNCTION PANEL
ALT: 12 U/L (ref 0–53)
AST: 14 U/L (ref 0–37)
Albumin: 4 g/dL (ref 3.5–5.2)
Alkaline Phosphatase: 85 U/L (ref 39–117)
Bilirubin, Direct: 0.2 mg/dL (ref 0.0–0.3)
Total Bilirubin: 0.8 mg/dL (ref 0.2–1.2)
Total Protein: 6.5 g/dL (ref 6.0–8.3)

## 2022-07-12 LAB — BASIC METABOLIC PANEL
BUN: 20 mg/dL (ref 6–23)
CO2: 30 mEq/L (ref 19–32)
Calcium: 8.8 mg/dL (ref 8.4–10.5)
Chloride: 105 mEq/L (ref 96–112)
Creatinine, Ser: 0.81 mg/dL (ref 0.40–1.50)
GFR: 75.62 mL/min (ref >60.00)
Glucose, Bld: 101 mg/dL — ABNORMAL HIGH (ref 70–99)
Potassium: 3.8 mEq/L (ref 3.5–5.1)
Sodium: 142 mEq/L (ref 135–145)

## 2022-07-12 LAB — LIPID PANEL
Cholesterol: 109 mg/dL (ref 0–200)
HDL: 58.2 mg/dL (ref >39.00)
LDL Cholesterol: 39 mg/dL (ref 0–99)
NonHDL: 50.72
Total CHOL/HDL Ratio: 2
Triglycerides: 58 mg/dL (ref 0.0–149.0)
VLDL: 11.6 mg/dL (ref 0.0–40.0)

## 2022-07-12 LAB — IBC PANEL
Iron: 76 ug/dL (ref 42–165)
Saturation Ratios: 24.6 % (ref 20.0–50.0)
TIBC: 309.4 ug/dL (ref 250.0–450.0)
Transferrin: 221 mg/dL (ref 212.0–360.0)

## 2022-07-12 LAB — HEMOGLOBIN A1C: Hgb A1c MFr Bld: 5.6 % (ref 4.6–6.5)

## 2022-07-12 LAB — CBC WITH DIFFERENTIAL/PLATELET
Basophils Absolute: 0 10*3/uL (ref 0.0–0.1)
Basophils Relative: 0.4 % (ref 0.0–3.0)
Eosinophils Absolute: 0.1 10*3/uL (ref 0.0–0.7)
Eosinophils Relative: 1 % (ref 0.0–5.0)
HCT: 42.2 % (ref 39.0–52.0)
Hemoglobin: 14.1 g/dL (ref 13.0–17.0)
Lymphocytes Relative: 25.9 % (ref 12.0–46.0)
Lymphs Abs: 1.3 10*3/uL (ref 0.7–4.0)
MCHC: 33.3 g/dL (ref 30.0–36.0)
MCV: 90.6 fl (ref 78.0–100.0)
Monocytes Absolute: 0.7 10*3/uL (ref 0.1–1.0)
Monocytes Relative: 14.3 % — ABNORMAL HIGH (ref 3.0–12.0)
Neutro Abs: 2.9 10*3/uL (ref 1.4–7.7)
Neutrophils Relative %: 58.4 % (ref 43.0–77.0)
Platelets: 135 10*3/uL — ABNORMAL LOW (ref 150.0–400.0)
RBC: 4.66 Mil/uL (ref 4.22–5.81)
RDW: 13.8 % (ref 11.5–15.5)
WBC: 4.9 10*3/uL (ref 4.0–10.5)

## 2022-07-12 LAB — TSH: TSH: 2.72 u[IU]/mL (ref 0.35–5.50)

## 2022-07-12 LAB — FERRITIN: Ferritin: 30 ng/mL (ref 22.0–322.0)

## 2022-07-15 ENCOUNTER — Ambulatory Visit: Payer: Medicare Other | Admitting: Internal Medicine

## 2022-07-15 ENCOUNTER — Encounter: Payer: Self-pay | Admitting: Internal Medicine

## 2022-07-15 VITALS — BP 118/60 | HR 54 | Temp 97.6°F | Ht 67.0 in | Wt 127.0 lb

## 2022-07-15 DIAGNOSIS — Z0001 Encounter for general adult medical examination with abnormal findings: Secondary | ICD-10-CM | POA: Insufficient documentation

## 2022-07-15 DIAGNOSIS — Z0000 Encounter for general adult medical examination without abnormal findings: Secondary | ICD-10-CM

## 2022-07-15 DIAGNOSIS — E7800 Pure hypercholesterolemia, unspecified: Secondary | ICD-10-CM

## 2022-07-15 DIAGNOSIS — R7302 Impaired glucose tolerance (oral): Secondary | ICD-10-CM

## 2022-07-15 DIAGNOSIS — J449 Chronic obstructive pulmonary disease, unspecified: Secondary | ICD-10-CM

## 2022-07-15 DIAGNOSIS — E039 Hypothyroidism, unspecified: Secondary | ICD-10-CM | POA: Diagnosis not present

## 2022-07-15 NOTE — Patient Instructions (Signed)
Please continue all other medications as before, and refills have been done if requested. ° °Please have the pharmacy call with any other refills you may need. ° °Please continue your efforts at being more active, low cholesterol diet, and weight control. ° °You are otherwise up to date with prevention measures today. ° °Please keep your appointments with your specialists as you may have planned ° °Please make an Appointment to return in 6 months, or sooner if needed °

## 2022-07-15 NOTE — Assessment & Plan Note (Signed)
Lab Results  Component Value Date   HGBA1C 5.6 07/12/2022   Stable, pt to continue current medical treatment  - diet, wt control

## 2022-07-15 NOTE — Assessment & Plan Note (Signed)
Lab Results  Component Value Date   LDLCALC 39 07/12/2022   Stable, pt to continue current statin crestor 10 mg qd

## 2022-07-15 NOTE — Assessment & Plan Note (Signed)
Lab Results  Component Value Date   TSH 2.72 07/12/2022   Stable, pt to continue levothyroxine 50 mcg qd

## 2022-07-15 NOTE — Assessment & Plan Note (Signed)
Stable, cont inhaler prn

## 2022-07-15 NOTE — Progress Notes (Signed)
Patient ID: Jason Barton, male   DOB: 04/17/1926, 89 y.o.   MRN: [id number]         Chief Complaint:: wellness exam        HPI:  Jason Barton is a 89 y.o. male here for wellness exam; for tdap and shingrix at the pharmacy, o/w up to date                        Also Pt denies chest pain, increased sob or doe, wheezing, orthopnea, PND, increased LE swelling, palpitations, dizziness or syncope.   Pt denies polydipsia, polyuria, or new focal neuro s/s.    Pt denies fever, wt loss, night sweats, loss of appetite, or other constitutional symptoms     Wt Readings from Last 3 Encounters:  07/15/22 127 lb (57.6 kg)  01/12/22 127 lb (57.6 kg)  11/10/21 130 lb 6.4 oz (59.1 kg)   BP Readings from Last 3 Encounters:  07/15/22 118/60  01/12/22 102/60  11/10/21 122/68   Immunization History  Administered Date(s) Administered   Influenza, Seasonal, Injecte, Preservative Fre 07/17/2012   PFIZER(Purple Top)SARS-COV-2 Vaccination 07/26/2019, 08/20/2019, 04/15/2020   Pneumococcal Conjugate-13 06/11/2013   Pneumococcal Polysaccharide-23 08/17/2017   Zoster, Live 07/24/2014   Health Maintenance Due  Topic Date Due   DTaP/Tdap/Td (1 - Tdap) Never done      Past Medical History:  Diagnosis Date   COPD (chronic obstructive pulmonary disease) ([place]) 05/21/2011   Diverticulosis of colon 05/21/2011   Hypothyroidism    Impaired glucose tolerance 06/08/2012   Increased prostate specific antigen (PSA) velocity 05/24/2011   Nonmelanoma skin cancer 05/21/2011   S/P appendectomy 05/21/2011   S/P inguinal hernia repair 05/21/2011   S/p small bowel obstruction 05/21/2011   Thoracic scoliosis 05/21/2011   Past Surgical History:  Procedure Laterality Date   APPENDECTOMY  1939   COLON RESECTION     2009 for a "kink in colon" adhesions after appendectomy   SHOULDER SURGERY     dr sypher; rotater cuff   STRABISMUS SURGERY Right 11/21/2014   Procedure: REPAIR STRABISMUS RIGHT EYE ;  Surgeon: Everitt Amber, MD;  Location: [facility];  Service: Ophthalmology;  Laterality: Right;   TONSILLECTOMY  1942    reports that he has never smoked. He has never used smokeless tobacco. He reports current alcohol use. He reports that he does not use drugs. family history includes Arthritis in an other family member; Sudden death in an other family member. Allergies  Allergen Reactions   Augmentin [Amoxicillin-Pot Clavulanate] Rash    Itching rash to all torso 2 days after start augmentin 12/2017   Peanut-Containing Drug Products Rash   Current Outpatient Medications on File Prior to Visit  Medication Sig Dispense Refill   alfuzosin (UROXATRAL) 10 MG 24 hr tablet Take 1 tablet (10 mg total) by mouth daily with breakfast. 90 tablet 1   Ascorbic Acid (VITAMIN C WITH ROSE HIPS) 500 MG tablet Take 500 mg by mouth daily.     ASPIRIN LOW DOSE 81 MG tablet TAKE 1 TABLET BY MOUTH DAILY. SWALLOW WHOLE. 100 tablet PRN   Cholecalciferol (VITAMIN D PO) Take 5,000 Units by mouth every morning.     levothyroxine (SYNTHROID) 50 MCG tablet TAKE 1 TABLET BY MOUTH EVERY DAY BEFORE BREAKFAST 90 tablet 3   polyethylene glycol (MIRALAX / GLYCOLAX) 17 g packet Take 17 g by mouth daily.     rosuvastatin (CRESTOR) 10 MG tablet TAKE 1  TABLET BY MOUTH EVERY DAY 90 tablet 3   selenium 50 MCG TABS tablet Take 50 mcg by mouth daily.     Specialty Vitamins Products (PROSTATE PO) Take by mouth daily.     thiamine (VITAMIN B-1) 100 MG tablet      thiamine 50 MG tablet Take 1 tablet (50 mg total) by mouth daily. 90 tablet 1   Zinc 50 MG TABS Take by mouth.     Iron-FA-B Cmp-C-Biot-Probiotic (FUSION PLUS) CAPS Take 1 capsule by mouth daily. (Patient not taking: Reported on 04/19/2022) 90 capsule 1   No current facility-administered medications on file prior to visit.        ROS:  All others reviewed and negative.  Objective        PE:  BP 118/60 (BP Location: Left Arm, Patient Position: Sitting, Cuff Size: Normal)    Pulse (!) 54   Temp 97.6 F (36.4 C) (Oral)   Ht '5\' 7"'$  (1.702 m)   Wt 127 lb (57.6 kg)   SpO2 98%   BMI 19.89 kg/m                 Constitutional: Pt appears in NAD               HENT: Head: NCAT.                Right Ear: External ear normal.                 Left Ear: External ear normal.                Eyes: . Pupils are equal, round, and reactive to light. Conjunctivae and EOM are normal               Nose: without d/c or deformity               Neck: Neck supple. Gross normal ROM               Cardiovascular: Normal rate and regular rhythm.                 Pulmonary/Chest: Effort normal and breath sounds without rales or wheezing.                Abd:  Soft, NT, ND, + BS, no organomegaly               Neurological: Pt is alert. At baseline orientation, motor grossly intact               Skin: Skin is warm. No rashes, no other new lesions, LE edema - none               Psychiatric: Pt behavior is normal without agitation   Micro: none  Cardiac tracings I have personally interpreted today:  none  Pertinent Radiological findings (summarize): none   Lab Results  Component Value Date   WBC 4.9 07/12/2022   HGB 14.1 07/12/2022   HCT 42.2 07/12/2022   PLT 135.0 (L) 07/12/2022   GLUCOSE 101 (H) 07/12/2022   CHOL 109 07/12/2022   TRIG 58.0 07/12/2022   HDL 58.20 07/12/2022   LDLCALC 39 07/12/2022   ALT 12 07/12/2022   AST 14 07/12/2022   NA 142 07/12/2022   K 3.8 07/12/2022   CL 105 07/12/2022   CREATININE 0.81 07/12/2022   BUN 20 07/12/2022   CO2 30 07/12/2022   TSH 2.72 07/12/2022   HGBA1C 5.6 07/12/2022  Assessment/Plan:  Jason Barton is a 89 y.o. White or Caucasian [1] male with  has a past medical history of COPD (chronic obstructive pulmonary disease) ([place]) (05/21/2011), Diverticulosis of colon (05/21/2011), Hypothyroidism, Impaired glucose tolerance (06/08/2012), Increased prostate specific antigen (PSA) velocity (05/24/2011), Nonmelanoma skin cancer  (05/21/2011), S/P appendectomy (05/21/2011), S/P inguinal hernia repair (05/21/2011), S/p small bowel obstruction (05/21/2011), and Thoracic scoliosis (05/21/2011).  COPD (chronic obstructive pulmonary disease) Stable, cont inhaler prn  Encounter for well adult exam with abnormal findings Age and sex appropriate education and counseling updated with regular exercise and diet Referrals for preventative services - none needed Immunizations addressed - for tdap and shingrix at pharmacy Smoking counseling  - none needed Evidence for depression or other mood disorder - none significant Most recent labs reviewed. I have personally reviewed and have noted: 1) the patient's medical and social history 2) The patient's current medications and supplements 3) The patient's height, weight, and BMI have been recorded in the chart   Hyperlipidemia Lab Results  Component Value Date   LDLCALC 39 07/12/2022   Stable, pt to continue current statin crestor 10 mg qd   Hypothyroidism Lab Results  Component Value Date   TSH 2.72 07/12/2022   Stable, pt to continue levothyroxine 50 mcg qd  Impaired glucose tolerance Lab Results  Component Value Date   HGBA1C 5.6 07/12/2022   Stable, pt to continue current medical treatment  - diet, wt control  Followup: Return in about 6 months (around 01/13/2023).  Cathlean Cower, MD 07/15/2022 12:52 PM [facility]

## 2022-07-15 NOTE — Assessment & Plan Note (Signed)
Age and sex appropriate education and counseling updated with regular exercise and diet Referrals for preventative services - none needed Immunizations addressed - for tdap and shingrix at pharmacy Smoking counseling  - none needed Evidence for depression or other mood disorder - none significant Most recent labs reviewed. I have personally reviewed and have noted: 1) the patient's medical and social history 2) The patient's current medications and supplements 3) The patient's height, weight, and BMI have been recorded in the chart  

## 2022-07-28 ENCOUNTER — Telehealth: Payer: Self-pay | Admitting: Internal Medicine

## 2022-07-28 NOTE — Telephone Encounter (Signed)
Patient came in and dropped off forms for Independent [facility] Supportive Living, to filled out by Dr. Jenny Reichmann. Patient stated that once forms are completed he would like to be called at [phone] so he could come pick the forms up. Forms were placed in Dr. Gwynn Burly box at the front.

## 2022-08-02 DIAGNOSIS — Z0289 Encounter for other administrative examinations: Secondary | ICD-10-CM

## 2022-08-02 NOTE — Telephone Encounter (Signed)
Form completion in progress

## 2022-08-03 ENCOUNTER — Telehealth: Payer: Self-pay | Admitting: Internal Medicine

## 2022-08-03 NOTE — Telephone Encounter (Signed)
Pt called and requesting for a call back. Pt is unsure why Doctor diagnosing him with COPD.   Call back number [phone]

## 2022-08-03 NOTE — Telephone Encounter (Signed)
Forms completed, patient notified and placed at front office for pickup

## 2022-08-03 NOTE — Telephone Encounter (Signed)
Most recent testing has shown this, and we needed a specific diagnosis for his recent paperwork, thanks

## 2022-08-14 ENCOUNTER — Other Ambulatory Visit: Payer: Self-pay | Admitting: Internal Medicine

## 2022-08-15 ENCOUNTER — Telehealth: Payer: Self-pay | Admitting: Internal Medicine

## 2022-08-15 NOTE — Telephone Encounter (Signed)
Please refill as per office routine med refill policy (all routine meds to be refilled for 3 mo or monthly (per pt preference) up to one year from last visit, then month to month grace period for 3 mo, then further med refills will have to be denied) ? ?

## 2022-08-15 NOTE — Telephone Encounter (Signed)
Patient declined OV due to having a busy week and states that the pain has subsided but wanted to make you aware of the leg pain. He will make time to come in if you recommend a visit

## 2022-08-15 NOTE — Telephone Encounter (Signed)
Patient would like a call back, states he had a sharp pain in his left leg between the knee and waist that lasted through the night, barely got any sleep. Best callback number is [phone].

## 2022-08-15 NOTE — Telephone Encounter (Signed)
Ok if the pain has resolved, and there is no sweling or other unusual symptoms, we can say pt to f/u for any worsening sumptoms

## 2022-08-16 NOTE — Telephone Encounter (Signed)
Patient states that there is no pain or swelling at this time but will update [place] if anything changes

## 2022-10-14 ENCOUNTER — Encounter: Payer: Self-pay | Admitting: Cardiology

## 2022-10-14 ENCOUNTER — Ambulatory Visit: Payer: Medicare Other | Attending: Cardiology | Admitting: Cardiology

## 2022-10-14 VITALS — BP 124/62 | HR 58 | Ht 67.0 in | Wt 129.0 lb

## 2022-10-14 DIAGNOSIS — E7800 Pure hypercholesterolemia, unspecified: Secondary | ICD-10-CM | POA: Diagnosis not present

## 2022-10-14 DIAGNOSIS — I700 Atherosclerosis of aorta: Secondary | ICD-10-CM | POA: Diagnosis not present

## 2022-10-14 DIAGNOSIS — R001 Bradycardia, unspecified: Secondary | ICD-10-CM

## 2022-10-14 DIAGNOSIS — G459 Transient cerebral ischemic attack, unspecified: Secondary | ICD-10-CM | POA: Diagnosis not present

## 2022-10-14 NOTE — Patient Instructions (Signed)
  Follow-Up: At [facility], you and your health needs are our priority.  As part of our continuing mission to provide you with exceptional heart care, we have created designated Provider Care Teams.  These Care Teams include your primary Cardiologist (physician) and Advanced Practice Providers (APPs -  Physician Assistants and Nurse Practitioners) who all work together to provide you with the care you need, when you need it.  We recommend signing up for the patient portal called "MyChart".  Sign up information is provided on this After Visit Summary.  MyChart is used to connect with patients for Virtual Visits (Telemedicine).  Patients are able to view lab/test results, encounter notes, upcoming appointments, etc.  Non-urgent messages can be sent to your provider as well.   To learn more about what you can do with MyChart, go to [email or web address].    Your next appointment:   12 month(s)  Provider:   Brian Crenshaw MD    

## 2022-10-14 NOTE — Progress Notes (Signed)
HPI: FU bradycardia.  Seen with possible TIA April 2022.  Also noted to have bradycardia with heart rate of 49.  Carotid Dopplers April 2022 showed near normal bilaterally.  Echocardiogram May 2022 showed normal LV function, grade 1 diastolic dysfunction, mild aortic insufficiency, mildly dilated aortic root at 41 mm.  Monitor June 2022 showed sinus rhythm with PACs, brief PAT, PVCs; lowest heart rate 38 at 5:54 AM.  No symptoms reported.  CTA January 2023 negative for large vessel occlusion.  There was a short segment of moderate stenosis at the supraclinoid left internal carotid artery.  Since last seen, patient denies dyspnea, chest pain, palpitations or syncope.  Current Outpatient Medications  Medication Sig Dispense Refill   alfuzosin (UROXATRAL) 10 MG 24 hr tablet Take 1 tablet (10 mg total) by mouth daily with breakfast. 90 tablet 1   Ascorbic Acid (VITAMIN C WITH ROSE HIPS) 500 MG tablet Take 500 mg by mouth daily.     ASPIRIN LOW DOSE 81 MG tablet TAKE 1 TABLET BY MOUTH DAILY. SWALLOW WHOLE. 100 tablet PRN   Cholecalciferol (VITAMIN D PO) Take 5,000 Units by mouth every morning.     Iron-FA-B Cmp-C-Biot-Probiotic (FUSION PLUS) CAPS Take 1 capsule by mouth daily. 90 capsule 1   levothyroxine (SYNTHROID) 50 MCG tablet TAKE 1 TABLET BY MOUTH EVERY DAY BEFORE BREAKFAST 90 tablet 3   polyethylene glycol (MIRALAX / GLYCOLAX) 17 g packet Take 17 g by mouth daily.     rosuvastatin (CRESTOR) 10 MG tablet TAKE 1 TABLET BY MOUTH EVERY DAY 90 tablet 3   selenium 50 MCG TABS tablet Take 50 mcg by mouth daily.     Specialty Vitamins Products (PROSTATE PO) Take by mouth daily.     thiamine 50 MG tablet Take 1 tablet (50 mg total) by mouth daily. 90 tablet 1   Zinc 50 MG TABS Take by mouth.     No current facility-administered medications for this visit.     Past Medical History:  Diagnosis Date   COPD (chronic obstructive pulmonary disease) ([facility]) 05/21/2011   Diverticulosis of colon  05/21/2011   Hypothyroidism    Impaired glucose tolerance 06/08/2012   Increased prostate specific antigen (PSA) velocity 05/24/2011   Nonmelanoma skin cancer 05/21/2011   S/P appendectomy 05/21/2011   S/P inguinal hernia repair 05/21/2011   S/p small bowel obstruction 05/21/2011   Thoracic scoliosis 05/21/2011    Past Surgical History:  Procedure Laterality Date   APPENDECTOMY  1939   COLON RESECTION     2009 for a "kink in colon" adhesions after appendectomy   SHOULDER SURGERY     dr sypher; rotater cuff   STRABISMUS SURGERY Right 11/21/2014   Procedure: REPAIR STRABISMUS RIGHT EYE ;  Surgeon: Verne Carrow, MD;  Location: [facility];  Service: Ophthalmology;  Laterality: Right;   TONSILLECTOMY  1942    Social History   Socioeconomic History   Marital status: Married    Spouse name: June   Number of children: 4   Years of education: MBA   Highest education level: Not on file  Occupational History   Occupation: retired  Tobacco Use   Smoking status: Never   Smokeless tobacco: Never  Building services engineer Use: Never used  Substance and Sexual Activity   Alcohol use: Yes    Comment: Rare   Drug use: No   Sexual activity: Never  Other Topics Concern   Not on file  Social History Narrative  Lives at home with wife   Right Handed   Drinks 1-2 cups caffeine daily   Social Determinants of Health   Financial Resource Strain: Low Risk  (04/19/2022)   Overall Financial Resource Strain (CARDIA)    Difficulty of Paying Living Expenses: Not hard at all  Food Insecurity: No Food Insecurity (04/19/2022)   Hunger Vital Sign    Worried About Running Out of Food in the Last Year: Never true    Ran Out of Food in the Last Year: Never true  Transportation Needs: No Transportation Needs (04/19/2022)   PRAPARE - Administrator, Civil Service (Medical): No    Lack of Transportation (Non-Medical): No  Physical Activity: Insufficiently Active (04/19/2022)    Exercise Vital Sign    Days of Exercise per Week: 3 days    Minutes of Exercise per Session: 30 min  Stress: No Stress Concern Present (04/19/2022)   [employer] of Occupational Health - Occupational Stress Questionnaire    Feeling of Stress : Only a little  Social Connections: Socially Integrated (04/19/2022)   Social Connection and Isolation Panel [NHANES]    Frequency of Communication with Friends and Family: More than three times a week    Frequency of Social Gatherings with Friends and Family: More than three times a week    Attends Religious Services: 1 to 4 times per year    Active Member of [employer] or Organizations: Yes    Attends Banker Meetings: 1 to 4 times per year    Marital Status: Married  Catering manager Violence: Not At Risk (04/19/2022)   Humiliation, Afraid, Rape, and Kick questionnaire    Fear of Current or Ex-Partner: No    Emotionally Abused: No    Physically Abused: No    Sexually Abused: No    Family History  Problem Relation Age of Onset   Arthritis Other    Sudden death Other    Colon cancer Neg Hx    Esophageal cancer Neg Hx    Rectal cancer Neg Hx     ROS: no fevers or chills, productive cough, hemoptysis, dysphasia, odynophagia, melena, hematochezia, dysuria, hematuria, rash, seizure activity, orthopnea, PND, pedal edema, claudication. Remaining systems are negative.  Physical Exam: Well-developed well-nourished in no acute distress.  Skin is warm and dry.  HEENT is normal.  Neck is supple.  Chest is clear to auscultation with normal expansion.  Cardiovascular exam is regular rate and rhythm.  Abdominal exam nontender or distended. No masses palpated. Extremities show no edema. neuro grossly intact  ECG-sinus bradycardia with occasional PVC, nonspecific ST changes.  Personally reviewed  A/P  1 bradycardia-minimal bradycardia.  No history of syncope.  Will follow.  2 hyperlipidemia-continue statin.  3 aortic  atherosclerosis-continue statin.  4 prior TIA-continue aspirin and statin.  Olga Millers, MD

## 2022-11-11 ENCOUNTER — Ambulatory Visit: Payer: Medicare Other | Admitting: Cardiology

## 2023-01-04 ENCOUNTER — Emergency Department (HOSPITAL_COMMUNITY): Payer: Medicare Other

## 2023-01-04 ENCOUNTER — Inpatient Hospital Stay (HOSPITAL_COMMUNITY)
Admission: EM | Admit: 2023-01-04 | Discharge: 2023-01-08 | DRG: 177 | Disposition: A | Discharge status: Skilled Nursing Facility | Payer: Medicare Other | Source: Skilled Nursing Facility | Attending: Family Medicine | Admitting: Family Medicine

## 2023-01-04 DIAGNOSIS — J189 Pneumonia, unspecified organism: Principal | ICD-10-CM

## 2023-01-04 DIAGNOSIS — K5900 Constipation, unspecified: Secondary | ICD-10-CM | POA: Diagnosis present

## 2023-01-04 DIAGNOSIS — Z8673 Personal history of transient ischemic attack (TIA), and cerebral infarction without residual deficits: Secondary | ICD-10-CM

## 2023-01-04 DIAGNOSIS — Z79899 Other long term (current) drug therapy: Secondary | ICD-10-CM

## 2023-01-04 DIAGNOSIS — F0394 Unspecified dementia, unspecified severity, with anxiety: Secondary | ICD-10-CM | POA: Diagnosis present

## 2023-01-04 DIAGNOSIS — U071 COVID-19: Secondary | ICD-10-CM | POA: Diagnosis not present

## 2023-01-04 DIAGNOSIS — E7800 Pure hypercholesterolemia, unspecified: Secondary | ICD-10-CM | POA: Diagnosis not present

## 2023-01-04 DIAGNOSIS — N320 Bladder-neck obstruction: Secondary | ICD-10-CM | POA: Diagnosis present

## 2023-01-04 DIAGNOSIS — D696 Thrombocytopenia, unspecified: Secondary | ICD-10-CM | POA: Diagnosis present

## 2023-01-04 DIAGNOSIS — E785 Hyperlipidemia, unspecified: Secondary | ICD-10-CM | POA: Diagnosis present

## 2023-01-04 DIAGNOSIS — J1282 Pneumonia due to coronavirus disease 2019: Secondary | ICD-10-CM | POA: Diagnosis present

## 2023-01-04 DIAGNOSIS — J690 Pneumonitis due to inhalation of food and vomit: Secondary | ICD-10-CM | POA: Diagnosis present

## 2023-01-04 DIAGNOSIS — I77810 Thoracic aortic ectasia: Secondary | ICD-10-CM | POA: Diagnosis present

## 2023-01-04 DIAGNOSIS — J159 Unspecified bacterial pneumonia: Secondary | ICD-10-CM | POA: Diagnosis present

## 2023-01-04 DIAGNOSIS — J9601 Acute respiratory failure with hypoxia: Secondary | ICD-10-CM | POA: Diagnosis present

## 2023-01-04 DIAGNOSIS — E039 Hypothyroidism, unspecified: Secondary | ICD-10-CM | POA: Diagnosis present

## 2023-01-04 DIAGNOSIS — R001 Bradycardia, unspecified: Secondary | ICD-10-CM | POA: Diagnosis present

## 2023-01-04 DIAGNOSIS — Z91010 Allergy to peanuts: Secondary | ICD-10-CM

## 2023-01-04 DIAGNOSIS — I493 Ventricular premature depolarization: Secondary | ICD-10-CM | POA: Diagnosis present

## 2023-01-04 DIAGNOSIS — I959 Hypotension, unspecified: Secondary | ICD-10-CM | POA: Insufficient documentation

## 2023-01-04 DIAGNOSIS — Z880 Allergy status to penicillin: Secondary | ICD-10-CM

## 2023-01-04 DIAGNOSIS — N400 Enlarged prostate without lower urinary tract symptoms: Secondary | ICD-10-CM | POA: Diagnosis present

## 2023-01-04 DIAGNOSIS — I2510 Atherosclerotic heart disease of native coronary artery without angina pectoris: Secondary | ICD-10-CM | POA: Diagnosis present

## 2023-01-04 DIAGNOSIS — Z7982 Long term (current) use of aspirin: Secondary | ICD-10-CM

## 2023-01-04 DIAGNOSIS — W1830XA Fall on same level, unspecified, initial encounter: Secondary | ICD-10-CM | POA: Diagnosis present

## 2023-01-04 DIAGNOSIS — Z85828 Personal history of other malignant neoplasm of skin: Secondary | ICD-10-CM

## 2023-01-04 DIAGNOSIS — I4891 Unspecified atrial fibrillation: Secondary | ICD-10-CM | POA: Diagnosis present

## 2023-01-04 DIAGNOSIS — F419 Anxiety disorder, unspecified: Secondary | ICD-10-CM | POA: Diagnosis present

## 2023-01-04 DIAGNOSIS — S0083XA Contusion of other part of head, initial encounter: Secondary | ICD-10-CM | POA: Diagnosis present

## 2023-01-04 DIAGNOSIS — J440 Chronic obstructive pulmonary disease with acute lower respiratory infection: Secondary | ICD-10-CM | POA: Diagnosis present

## 2023-01-04 DIAGNOSIS — Z79890 Hormone replacement therapy (postmenopausal): Secondary | ICD-10-CM

## 2023-01-04 DIAGNOSIS — Z7901 Long term (current) use of anticoagulants: Secondary | ICD-10-CM

## 2023-01-04 DIAGNOSIS — J449 Chronic obstructive pulmonary disease, unspecified: Secondary | ICD-10-CM | POA: Diagnosis present

## 2023-01-04 DIAGNOSIS — Z8261 Family history of arthritis: Secondary | ICD-10-CM

## 2023-01-04 LAB — PROTIME-INR
INR: 1.1 (ref 0.8–1.2)
Prothrombin Time: 14.6 seconds (ref 11.4–15.2)

## 2023-01-04 LAB — I-STAT CHEM 8, ED
BUN: 20 mg/dL (ref 8–23)
Calcium, Ion: 1.07 mmol/L — ABNORMAL LOW (ref 1.15–1.40)
Chloride: 100 mmol/L (ref 98–111)
Creatinine, Ser: 0.9 mg/dL (ref 0.61–1.24)
Glucose, Bld: 127 mg/dL — ABNORMAL HIGH (ref 70–99)
HCT: 39 % (ref 39.0–52.0)
Hemoglobin: 13.3 g/dL (ref 13.0–17.0)
Potassium: 3.7 mmol/L (ref 3.5–5.1)
Sodium: 137 mmol/L (ref 135–145)
TCO2: 23 mmol/L (ref 22–32)

## 2023-01-04 LAB — CBC
HCT: 40.7 % (ref 39.0–52.0)
Hemoglobin: 13.4 g/dL (ref 13.0–17.0)
MCH: 30.2 pg (ref 26.0–34.0)
MCHC: 32.9 g/dL (ref 30.0–36.0)
MCV: 91.9 fL (ref 80.0–100.0)
Platelets: 71 10*3/uL — ABNORMAL LOW (ref 150–400)
RBC: 4.43 MIL/uL (ref 4.22–5.81)
RDW: 14.3 % (ref 11.5–15.5)
WBC: 3.5 10*3/uL — ABNORMAL LOW (ref 4.0–10.5)
nRBC: 0 % (ref 0.0–0.2)

## 2023-01-04 LAB — URINALYSIS, W/ REFLEX TO CULTURE (INFECTION SUSPECTED)
Bilirubin Urine: NEGATIVE
Glucose, UA: NEGATIVE mg/dL
Hgb urine dipstick: NEGATIVE
Ketones, ur: 5 mg/dL — AB
Leukocytes,Ua: NEGATIVE
Nitrite: NEGATIVE
Protein, ur: NEGATIVE mg/dL
Specific Gravity, Urine: 1.02 (ref 1.005–1.030)
pH: 5 (ref 5.0–8.0)

## 2023-01-04 LAB — COMPREHENSIVE METABOLIC PANEL
ALT: 19 U/L (ref 0–44)
AST: 27 U/L (ref 15–41)
Albumin: 3.6 g/dL (ref 3.5–5.0)
Alkaline Phosphatase: 65 U/L (ref 38–126)
Anion gap: 13 (ref 5–15)
BUN: 18 mg/dL (ref 8–23)
CO2: 23 mmol/L (ref 22–32)
Calcium: 8.5 mg/dL — ABNORMAL LOW (ref 8.9–10.3)
Chloride: 100 mmol/L (ref 98–111)
Creatinine, Ser: 1 mg/dL (ref 0.61–1.24)
GFR, Estimated: >60 mL/min (ref >60)
Glucose, Bld: 131 mg/dL — ABNORMAL HIGH (ref 70–99)
Potassium: 3.8 mmol/L (ref 3.5–5.1)
Sodium: 136 mmol/L (ref 135–145)
Total Bilirubin: 0.6 mg/dL (ref 0.3–1.2)
Total Protein: 5.9 g/dL — ABNORMAL LOW (ref 6.5–8.1)

## 2023-01-04 LAB — I-STAT CG4 LACTIC ACID, ED: Lactic Acid, Venous: 1.4 mmol/L (ref 0.5–1.9)

## 2023-01-04 LAB — BRAIN NATRIURETIC PEPTIDE: B Natriuretic Peptide: 465.7 pg/mL — ABNORMAL HIGH (ref 0.0–100.0)

## 2023-01-04 LAB — RESP PANEL BY RT-PCR (RSV, FLU A&B, COVID)  RVPGX2
Influenza A by PCR: NEGATIVE
Influenza B by PCR: NEGATIVE
Resp Syncytial Virus by PCR: NEGATIVE
SARS Coronavirus 2 by RT PCR: POSITIVE — AB

## 2023-01-04 LAB — TROPONIN I (HIGH SENSITIVITY)
Troponin I (High Sensitivity): 10 ng/L (ref <18)
Troponin I (High Sensitivity): 13 ng/L (ref <18)

## 2023-01-04 LAB — CK: Total CK: 107 U/L (ref 49–397)

## 2023-01-04 MED ORDER — LACTATED RINGERS IV SOLN: INTRAVENOUS | Status: AC

## 2023-01-04 MED ORDER — THIAMINE HCL 100 MG PO TABS
50.0000 mg | ORAL_TABLET | Freq: Every day | ORAL | Status: DC
  Administered 2023-01-05 – 2023-01-08 (×4): 50 mg via ORAL
  Filled 2023-01-04 (×7): qty 1

## 2023-01-04 MED ORDER — SODIUM CHLORIDE 0.9% FLUSH
3.0000 mL | Freq: Two times a day (BID) | INTRAVENOUS | Status: DC
  Administered 2023-01-04 – 2023-01-08 (×8): 3 mL via INTRAVENOUS

## 2023-01-04 MED ORDER — ROSUVASTATIN CALCIUM 5 MG PO TABS
10.0000 mg | ORAL_TABLET | Freq: Every day | ORAL | Status: DC
  Administered 2023-01-05 – 2023-01-08 (×4): 10 mg via ORAL
  Filled 2023-01-04 (×4): qty 2

## 2023-01-04 MED ORDER — LEVOTHYROXINE SODIUM 50 MCG PO TABS
50.0000 ug | ORAL_TABLET | Freq: Every day | ORAL | Status: DC
  Administered 2023-01-05 – 2023-01-08 (×4): 50 ug via ORAL
  Filled 2023-01-04 (×4): qty 1

## 2023-01-04 MED ORDER — ACETAMINOPHEN 325 MG PO TABS: 650.0000 mg | ORAL_TABLET | Freq: Four times a day (QID) | ORAL | Status: DC | PRN

## 2023-01-04 MED ORDER — ACETAMINOPHEN 500 MG PO TABS
1000.0000 mg | ORAL_TABLET | Freq: Once | ORAL | Status: AC
  Administered 2023-01-04: 1000 mg via ORAL
  Filled 2023-01-04: qty 2

## 2023-01-04 MED ORDER — POLYETHYLENE GLYCOL 3350 17 G PO PACK: 17.0000 g | PACK | Freq: Every day | ORAL | Status: DC | PRN

## 2023-01-04 MED ORDER — SODIUM CHLORIDE 0.9 % IV SOLN
2.0000 g | Freq: Once | INTRAVENOUS | Status: AC
  Administered 2023-01-04: 2 g via INTRAVENOUS
  Filled 2023-01-04: qty 12.5

## 2023-01-04 MED ORDER — VANCOMYCIN HCL IN DEXTROSE 1-5 GM/200ML-% IV SOLN: 1000.0000 mg | Freq: Once | INTRAVENOUS | Status: DC

## 2023-01-04 MED ORDER — ACETAMINOPHEN 650 MG RE SUPP: 650.0000 mg | Freq: Four times a day (QID) | RECTAL | Status: DC | PRN

## 2023-01-04 MED ORDER — DEXAMETHASONE SODIUM PHOSPHATE 10 MG/ML IJ SOLN
6.0000 mg | INTRAMUSCULAR | Status: DC
  Administered 2023-01-04 – 2023-01-05 (×2): 6 mg via INTRAVENOUS
  Filled 2023-01-04: qty 1
  Filled 2023-01-04 (×2): qty 0.6

## 2023-01-04 MED ORDER — SODIUM CHLORIDE 0.9 % IV SOLN
100.0000 mg | Freq: Every day | INTRAVENOUS | Status: AC
  Administered 2023-01-05 – 2023-01-06 (×2): 100 mg via INTRAVENOUS
  Filled 2023-01-04 (×2): qty 20

## 2023-01-04 MED ORDER — SODIUM CHLORIDE 0.9 % IV SOLN
500.0000 mg | INTRAVENOUS | Status: DC
  Administered 2023-01-05: 500 mg via INTRAVENOUS
  Filled 2023-01-04 (×2): qty 5

## 2023-01-04 MED ORDER — SODIUM CHLORIDE 0.9 % IV SOLN
2.0000 g | INTRAVENOUS | Status: DC
  Administered 2023-01-05 – 2023-01-07 (×3): 2 g via INTRAVENOUS
  Filled 2023-01-04 (×3): qty 20

## 2023-01-04 MED ORDER — LACTATED RINGERS IV BOLUS (SEPSIS)
500.0000 mL | Freq: Once | INTRAVENOUS | Status: AC
  Administered 2023-01-04: 500 mL via INTRAVENOUS

## 2023-01-04 MED ORDER — METRONIDAZOLE 500 MG/100ML IV SOLN
500.0000 mg | Freq: Once | INTRAVENOUS | Status: AC
  Administered 2023-01-04: 500 mg via INTRAVENOUS
  Filled 2023-01-04: qty 100

## 2023-01-04 MED ORDER — VANCOMYCIN HCL 1250 MG/250ML IV SOLN
1250.0000 mg | Freq: Once | INTRAVENOUS | Status: AC
  Administered 2023-01-04: 1250 mg via INTRAVENOUS
  Filled 2023-01-04 (×2): qty 250

## 2023-01-04 MED ORDER — SODIUM CHLORIDE 0.9 % IV SOLN
200.0000 mg | Freq: Once | INTRAVENOUS | Status: AC
  Administered 2023-01-04: 200 mg via INTRAVENOUS
  Filled 2023-01-04: qty 40

## 2023-01-04 MED ORDER — LACTATED RINGERS IV BOLUS
1000.0000 mL | Freq: Once | INTRAVENOUS | Status: AC
  Administered 2023-01-04: 1000 mL via INTRAVENOUS

## 2023-01-04 NOTE — ED Provider Notes (Signed)
[facility] AT [facility] Provider Note   CSN: [id number] Arrival date & time: 01/04/23  1106     History  Chief Complaint  Patient presents with   Level 2   Fall on [place]    Jason Barton is a 89 y.o. male.  HPI Patient presents after a fall.  He arrives via EMS from nursing facility.  He lives there with his wife, who has dementia.  Nursing facility reports that patient has had a recent lethargy and stayed in the bed all day because he was not feeling well.  He is not on oxygen at baseline.  Earlier today, he was standing in the doorway.  He leaned back on the door that he felt was closed.  This caused him to fall backwards.  He was laying on the ground, on his back for unknown amount of time.  LOC is unknown.  When EMS arrived on scene, patient was on his back with vomitus around his mouth and face.  They suspect aspiration.  SpO2 was in the mid to high 80s on room air.  He was placed on a nonrebreather.  He was noted to have an area of erythema on occipital scalp.  Patient is on warfarin.  Patient initially was bradycardic in the 40s.  Rhythm strip showed slow A-fib.  Heart rate improved with supplemental oxygen and position change.  Patient denies any current areas of pain.    Home Medications Prior to Admission medications   Medication Sig Start Date End Date Taking? Authorizing Provider  alfuzosin (UROXATRAL) 10 MG 24 hr tablet Take 1 tablet (10 mg total) by mouth daily with breakfast. 02/05/20  Yes Etta Grandchild, MD  Ascorbic Acid (VITAMIN C WITH ROSE HIPS) 500 MG tablet Take 500 mg by mouth daily.   Yes [provider]  ASPIRIN LOW DOSE 81 MG tablet TAKE 1 TABLET BY MOUTH DAILY. SWALLOW WHOLE. Patient taking differently: Take 81 mg by mouth daily. 11/08/21  Yes Corwin Levins, MD  Cholecalciferol (VITAMIN D PO) Take 5,000 Units by mouth every morning.   Yes [provider]  levothyroxine (SYNTHROID) 50 MCG tablet TAKE 1  TABLET BY MOUTH EVERY DAY BEFORE BREAKFAST Patient taking differently: Take 50 mcg by mouth daily before breakfast. 03/28/22  Yes Corwin Levins, MD  polyethylene glycol (MIRALAX / GLYCOLAX) 17 g packet Take 17 g by mouth daily.   Yes [provider]  rosuvastatin (CRESTOR) 10 MG tablet TAKE 1 TABLET BY MOUTH EVERY DAY 08/16/22  Yes Corwin Levins, MD  selenium 50 MCG TABS tablet Take 50 mcg by mouth daily.   Yes [provider]  thiamine 50 MG tablet Take 1 tablet (50 mg total) by mouth daily. 02/14/20  Yes Etta Grandchild, MD  Zinc 50 MG TABS Take by mouth.   Yes [provider]      Allergies    Augmentin [amoxicillin-pot clavulanate] and Peanut-containing drug products    Review of Systems   Review of Systems  Constitutional:  Positive for fatigue and fever.  Respiratory:  Positive for shortness of breath.   Neurological:  Positive for weakness (Generalized).  All other systems reviewed and are negative.   Physical Exam Updated Vital Signs BP 94/61   Pulse (!) 38   Temp 99.5 F (37.5 C) (Rectal)   Resp 19   SpO2 98%  Physical Exam Vitals and nursing note reviewed.  Constitutional:      General: He  is not in acute distress.    Appearance: He is well-developed. He is ill-appearing. He is not toxic-appearing or diaphoretic.  HENT:     Head: Normocephalic.     Right Ear: External ear normal.     Left Ear: External ear normal.     Nose: Nose normal.  Eyes:     Extraocular Movements: Extraocular movements intact.     Conjunctiva/sclera: Conjunctivae normal.  Neck:     Comments: Cervical collar in place Cardiovascular:     Rate and Rhythm: Normal rate and regular rhythm.     Heart sounds: No murmur heard. Pulmonary:     Effort: Pulmonary effort is normal. No respiratory distress.     Breath sounds: Rhonchi and rales present. No wheezing.  Abdominal:     General: There is distension.     Palpations: Abdomen is soft.     Tenderness: There is no  abdominal tenderness. There is no guarding or rebound.  Musculoskeletal:        General: No swelling.     Cervical back: Neck supple.     Right lower leg: No edema.     Left lower leg: No edema.  Skin:    General: Skin is warm and dry.     Coloration: Skin is not jaundiced or pale.  Neurological:     General: No focal deficit present.     Mental Status: He is alert and oriented to person, place, and time.  Psychiatric:        Mood and Affect: Mood normal.        Behavior: Behavior normal.     ED Results / Procedures / Treatments   Labs (all labs ordered are listed, but only abnormal results are displayed) Labs Reviewed  RESP PANEL BY RT-PCR (RSV, FLU A&B, COVID)  [id number] - Abnormal; Notable for the following components:      Result Value   SARS Coronavirus 2 by RT PCR POSITIVE (*)    All other components within normal limits  COMPREHENSIVE METABOLIC PANEL - Abnormal; Notable for the following components:   Glucose, Bld 131 (*)    Calcium 8.5 (*)    Total Protein 5.9 (*)    All other components within normal limits  CBC - Abnormal; Notable for the following components:   WBC 3.5 (*)    Platelets 71 (*)    All other components within normal limits  URINALYSIS, W/ REFLEX TO CULTURE (INFECTION SUSPECTED) - Abnormal; Notable for the following components:   Ketones, ur 5 (*)    Bacteria, UA RARE (*)    All other components within normal limits  BRAIN NATRIURETIC PEPTIDE - Abnormal; Notable for the following components:   B Natriuretic Peptide 465.7 (*)    All other components within normal limits  I-STAT CHEM 8, ED - Abnormal; Notable for the following components:   Glucose, Bld 127 (*)    Calcium, Ion 1.07 (*)    All other components within normal limits  CULTURE, BLOOD (ROUTINE X 2)  CULTURE, BLOOD (ROUTINE X 2)  PROTIME-INR  CK  PROCALCITONIN  COMPREHENSIVE METABOLIC PANEL  CBC  PROCALCITONIN  I-STAT CG4 LACTIC ACID, ED  TROPONIN I (HIGH SENSITIVITY)  TROPONIN I  (HIGH SENSITIVITY)    EKG EKG Interpretation Date/Time:  Wednesday January 04 2023 11:21:37 EDT Ventricular Rate:  60 PR Interval:  173 QRS Duration:  112 QT Interval:  389 QTC Calculation: 389 R Axis:   78  Text Interpretation: Sinus rhythm Borderline intraventricular  conduction delay Nonspecific T abnormalities, lateral leads Confirmed by Gloris Manchester [phone]) on 01/04/2023 11:58:20 AM  Radiology CT HEAD WO CONTRAST  Result Date: 01/04/2023 CLINICAL DATA:  Fall EXAM: CT HEAD WITHOUT CONTRAST CT CERVICAL SPINE WITHOUT CONTRAST TECHNIQUE: Multidetector CT imaging of the head and cervical spine was performed following the standard protocol without intravenous contrast. Multiplanar CT image reconstructions of the cervical spine were also generated. RADIATION DOSE REDUCTION: This exam was performed according to the departmental dose-optimization program which includes automated exposure control, adjustment of the mA and/or kV according to patient size and/or use of iterative reconstruction technique. COMPARISON:  06/23/2021 FINDINGS: CT HEAD FINDINGS Brain: No evidence of acute infarction, hemorrhage, hydrocephalus, extra-axial collection or mass lesion/mass effect. Periventricular and deep white matter hypodensity. Vascular: No hyperdense vessel or unexpected calcification. Skull: Normal. Negative for fracture or focal lesion. Sinuses/Orbits: No acute finding. Other: Soft tissue contusion of the right forehead (series 3, image 22). CT CERVICAL SPINE FINDINGS Alignment: Normal. Skull base and vertebrae: No acute fracture. No primary bone lesion or focal pathologic process. Soft tissues and spinal canal: No prevertebral fluid or swelling. No visible canal hematoma. Disc levels: Mild-to-moderate disc space height loss and osteophytosis, worst at C3-C4. Upper chest: Negative. Other: None. IMPRESSION: 1. No acute intracranial pathology. Small-vessel white matter disease. 2. Soft tissue contusion of the right  forehead. 3. No fracture or static subluxation of the cervical spine. 4. Mild-to-moderate cervical disc degenerative disease. Electronically Signed   By: Jearld Lesch M.D.   On: 01/04/2023 13:29   CT CERVICAL SPINE WO CONTRAST  Result Date: 01/04/2023 CLINICAL DATA:  Fall EXAM: CT HEAD WITHOUT CONTRAST CT CERVICAL SPINE WITHOUT CONTRAST TECHNIQUE: Multidetector CT imaging of the head and cervical spine was performed following the standard protocol without intravenous contrast. Multiplanar CT image reconstructions of the cervical spine were also generated. RADIATION DOSE REDUCTION: This exam was performed according to the departmental dose-optimization program which includes automated exposure control, adjustment of the mA and/or kV according to patient size and/or use of iterative reconstruction technique. COMPARISON:  06/23/2021 FINDINGS: CT HEAD FINDINGS Brain: No evidence of acute infarction, hemorrhage, hydrocephalus, extra-axial collection or mass lesion/mass effect. Periventricular and deep white matter hypodensity. Vascular: No hyperdense vessel or unexpected calcification. Skull: Normal. Negative for fracture or focal lesion. Sinuses/Orbits: No acute finding. Other: Soft tissue contusion of the right forehead (series 3, image 22). CT CERVICAL SPINE FINDINGS Alignment: Normal. Skull base and vertebrae: No acute fracture. No primary bone lesion or focal pathologic process. Soft tissues and spinal canal: No prevertebral fluid or swelling. No visible canal hematoma. Disc levels: Mild-to-moderate disc space height loss and osteophytosis, worst at C3-C4. Upper chest: Negative. Other: None. IMPRESSION: 1. No acute intracranial pathology. Small-vessel white matter disease. 2. Soft tissue contusion of the right forehead. 3. No fracture or static subluxation of the cervical spine. 4. Mild-to-moderate cervical disc degenerative disease. Electronically Signed   By: Jearld Lesch M.D.   On: 01/04/2023 13:29   CT  CHEST ABDOMEN PELVIS WO CONTRAST  Result Date: 01/04/2023 CLINICAL DATA:  Fall EXAM: CT CHEST, ABDOMEN, AND PELVIS WITHOUT CONTRAST CT THORACIC AND LUMBAR SPINE WITHOUT CONTRAST TECHNIQUE: Multidetector CT imaging of the chest, abdomen and pelvis was performed following the standard protocol without administration of intravenous contrast. Multidetector CT imaging of the thoracic and lumbar spine was performed following the standard protocol without administration of intravenous contrast. RADIATION DOSE REDUCTION: This exam was performed according to the departmental dose-optimization program which includes automated exposure  control, adjustment of the mA and/or kV according to patient size and/or use of iterative reconstruction technique. COMPARISON:  None Available. FINDINGS: CT CHEST FINDINGS Cardiovascular: Aortic atherosclerosis. Normal heart size. Left coronary artery calcifications. No pericardial effusion. Mediastinum/Nodes: No enlarged mediastinal, hilar, or axillary lymph nodes. Thyroid gland, trachea, and esophagus demonstrate no significant findings. Lungs/Pleura: Scattered ground-glass airspace opacities throughout the right lung base (series 4, image 69). No pleural effusion or pneumothorax. Musculoskeletal: No chest wall mass or suspicious osseous lesions identified. CT ABDOMEN PELVIS FINDINGS Hepatobiliary: No solid liver abnormality is seen. No gallstones, gallbladder wall thickening, or biliary dilatation. Pancreas: Unremarkable. No pancreatic ductal dilatation or surrounding inflammatory changes. Spleen: Normal in size without significant abnormality. Adrenals/Urinary Tract: Adrenal glands are unremarkable. Kidneys are normal, without renal calculi, solid lesion, or hydronephrosis. Bladder is unremarkable. Stomach/Bowel: Stomach is within normal limits. Appendix appears normal. No evidence of bowel wall thickening, distention, or inflammatory changes. Descending and sigmoid diverticulosis. Large  burden of stool throughout the colon and rectum. Vascular/Lymphatic: Aortic atherosclerosis. No enlarged abdominal or pelvic lymph nodes. Reproductive: Prostatomegaly. Other: Small, fat containing midline epigastric hernia (series 3, image 83). No ascites. Musculoskeletal: No acute osseous findings. CT THORACIC AND LUMBAR SPINE FINDINGS Alignment: Gentle dextroscoliosis of the thoracic spine, with otherwise normal thoracic kyphosis. Normal lumbar lordosis. Vertebral bodies: Intact. No fracture or dislocation. Disc spaces: Mild disc space height loss and osteophytosis throughout the thoracic spine. Mild disc degenerative change L1-L2 with otherwise intact lumbar disc spaces. Mild facet degenerative change of the lower lumbar levels. Paraspinous soft tissues: Unremarkable. IMPRESSION: 1. Scattered ground-glass airspace opacities throughout the right lung base, consistent with infection or aspiration. 2. No noncontrast CT evidence of acute traumatic injury to the chest, abdomen, or pelvis. 3. No fracture or dislocation of the thoracic or lumbar spine. 4. Descending and sigmoid diverticulosis. 5. Large burden of stool throughout the colon and rectum. 6. Prostatomegaly. 7. Coronary artery disease. Aortic Atherosclerosis (ICD10-I70.0). Electronically Signed   By: Jearld Lesch M.D.   On: 01/04/2023 13:23   CT T-SPINE NO CHARGE  Result Date: 01/04/2023 CLINICAL DATA:  Fall EXAM: CT CHEST, ABDOMEN, AND PELVIS WITHOUT CONTRAST CT THORACIC AND LUMBAR SPINE WITHOUT CONTRAST TECHNIQUE: Multidetector CT imaging of the chest, abdomen and pelvis was performed following the standard protocol without administration of intravenous contrast. Multidetector CT imaging of the thoracic and lumbar spine was performed following the standard protocol without administration of intravenous contrast. RADIATION DOSE REDUCTION: This exam was performed according to the departmental dose-optimization program which includes automated exposure  control, adjustment of the mA and/or kV according to patient size and/or use of iterative reconstruction technique. COMPARISON:  None Available. FINDINGS: CT CHEST FINDINGS Cardiovascular: Aortic atherosclerosis. Normal heart size. Left coronary artery calcifications. No pericardial effusion. Mediastinum/Nodes: No enlarged mediastinal, hilar, or axillary lymph nodes. Thyroid gland, trachea, and esophagus demonstrate no significant findings. Lungs/Pleura: Scattered ground-glass airspace opacities throughout the right lung base (series 4, image 69). No pleural effusion or pneumothorax. Musculoskeletal: No chest wall mass or suspicious osseous lesions identified. CT ABDOMEN PELVIS FINDINGS Hepatobiliary: No solid liver abnormality is seen. No gallstones, gallbladder wall thickening, or biliary dilatation. Pancreas: Unremarkable. No pancreatic ductal dilatation or surrounding inflammatory changes. Spleen: Normal in size without significant abnormality. Adrenals/Urinary Tract: Adrenal glands are unremarkable. Kidneys are normal, without renal calculi, solid lesion, or hydronephrosis. Bladder is unremarkable. Stomach/Bowel: Stomach is within normal limits. Appendix appears normal. No evidence of bowel wall thickening, distention, or inflammatory changes. Descending and sigmoid diverticulosis. Large  burden of stool throughout the colon and rectum. Vascular/Lymphatic: Aortic atherosclerosis. No enlarged abdominal or pelvic lymph nodes. Reproductive: Prostatomegaly. Other: Small, fat containing midline epigastric hernia (series 3, image 83). No ascites. Musculoskeletal: No acute osseous findings. CT THORACIC AND LUMBAR SPINE FINDINGS Alignment: Gentle dextroscoliosis of the thoracic spine, with otherwise normal thoracic kyphosis. Normal lumbar lordosis. Vertebral bodies: Intact. No fracture or dislocation. Disc spaces: Mild disc space height loss and osteophytosis throughout the thoracic spine. Mild disc degenerative change  L1-L2 with otherwise intact lumbar disc spaces. Mild facet degenerative change of the lower lumbar levels. Paraspinous soft tissues: Unremarkable. IMPRESSION: 1. Scattered ground-glass airspace opacities throughout the right lung base, consistent with infection or aspiration. 2. No noncontrast CT evidence of acute traumatic injury to the chest, abdomen, or pelvis. 3. No fracture or dislocation of the thoracic or lumbar spine. 4. Descending and sigmoid diverticulosis. 5. Large burden of stool throughout the colon and rectum. 6. Prostatomegaly. 7. Coronary artery disease. Aortic Atherosclerosis (ICD10-I70.0). Electronically Signed   By: Jearld Lesch M.D.   On: 01/04/2023 13:23   CT L-SPINE NO CHARGE  Result Date: 01/04/2023 CLINICAL DATA:  Fall EXAM: CT CHEST, ABDOMEN, AND PELVIS WITHOUT CONTRAST CT THORACIC AND LUMBAR SPINE WITHOUT CONTRAST TECHNIQUE: Multidetector CT imaging of the chest, abdomen and pelvis was performed following the standard protocol without administration of intravenous contrast. Multidetector CT imaging of the thoracic and lumbar spine was performed following the standard protocol without administration of intravenous contrast. RADIATION DOSE REDUCTION: This exam was performed according to the departmental dose-optimization program which includes automated exposure control, adjustment of the mA and/or kV according to patient size and/or use of iterative reconstruction technique. COMPARISON:  None Available. FINDINGS: CT CHEST FINDINGS Cardiovascular: Aortic atherosclerosis. Normal heart size. Left coronary artery calcifications. No pericardial effusion. Mediastinum/Nodes: No enlarged mediastinal, hilar, or axillary lymph nodes. Thyroid gland, trachea, and esophagus demonstrate no significant findings. Lungs/Pleura: Scattered ground-glass airspace opacities throughout the right lung base (series 4, image 69). No pleural effusion or pneumothorax. Musculoskeletal: No chest wall mass or  suspicious osseous lesions identified. CT ABDOMEN PELVIS FINDINGS Hepatobiliary: No solid liver abnormality is seen. No gallstones, gallbladder wall thickening, or biliary dilatation. Pancreas: Unremarkable. No pancreatic ductal dilatation or surrounding inflammatory changes. Spleen: Normal in size without significant abnormality. Adrenals/Urinary Tract: Adrenal glands are unremarkable. Kidneys are normal, without renal calculi, solid lesion, or hydronephrosis. Bladder is unremarkable. Stomach/Bowel: Stomach is within normal limits. Appendix appears normal. No evidence of bowel wall thickening, distention, or inflammatory changes. Descending and sigmoid diverticulosis. Large burden of stool throughout the colon and rectum. Vascular/Lymphatic: Aortic atherosclerosis. No enlarged abdominal or pelvic lymph nodes. Reproductive: Prostatomegaly. Other: Small, fat containing midline epigastric hernia (series 3, image 83). No ascites. Musculoskeletal: No acute osseous findings. CT THORACIC AND LUMBAR SPINE FINDINGS Alignment: Gentle dextroscoliosis of the thoracic spine, with otherwise normal thoracic kyphosis. Normal lumbar lordosis. Vertebral bodies: Intact. No fracture or dislocation. Disc spaces: Mild disc space height loss and osteophytosis throughout the thoracic spine. Mild disc degenerative change L1-L2 with otherwise intact lumbar disc spaces. Mild facet degenerative change of the lower lumbar levels. Paraspinous soft tissues: Unremarkable. IMPRESSION: 1. Scattered ground-glass airspace opacities throughout the right lung base, consistent with infection or aspiration. 2. No noncontrast CT evidence of acute traumatic injury to the chest, abdomen, or pelvis. 3. No fracture or dislocation of the thoracic or lumbar spine. 4. Descending and sigmoid diverticulosis. 5. Large burden of stool throughout the colon and rectum. 6. Prostatomegaly. 7. Coronary  artery disease. Aortic Atherosclerosis (ICD10-I70.0). Electronically  Signed   By: Jearld Lesch M.D.   On: 01/04/2023 13:23   DG Chest Port 1 View  Result Date: 01/04/2023 CLINICAL DATA:  Level 2 trauma. Patient fell through door way. Complains of shortness of breath, fever and COVID exposure. EXAM: PORTABLE CHEST 1 VIEW COMPARISON:  02/05/20 FINDINGS: Heart size and mediastinal contours are normal. Aortic atherosclerosis. Lungs appear hyperinflated with by lateral upper lobe scarring. No superimposed pleural effusion, interstitial edema or airspace consolidation. Curvature of the thoracic spine is convex towards the right. No acute osseous findings. IMPRESSION: 1. No acute cardiopulmonary disease. 2.  Aortic Atherosclerosis (ICD10-I70.0). Electronically Signed   By: Signa Kell M.D.   On: 01/04/2023 12:18   DG Pelvis Portable  Result Date: 01/04/2023 CLINICAL DATA:  Fall. EXAM: PORTABLE PELVIS 1-2 VIEWS COMPARISON:  None Available. FINDINGS: There is no evidence of pelvic fracture or diastasis. No pelvic bone lesions are seen. IMPRESSION: Negative. Electronically Signed   By: Obie Dredge M.D.   On: 01/04/2023 12:13    Procedures Procedures    Medications Ordered in ED Medications  vancomycin (VANCOREADY) IVPB 1250 mg/250 mL (0 mg Intravenous Hold 01/04/23 1330)  sodium chloride flush (NS) 0.9 % injection 3 mL (has no administration in time range)  acetaminophen (TYLENOL) tablet 650 mg (has no administration in time range)    Or  acetaminophen (TYLENOL) suppository 650 mg (has no administration in time range)  polyethylene glycol (MIRALAX / GLYCOLAX) packet 17 g (has no administration in time range)  lactated ringers infusion (has no administration in time range)  dexamethasone (DECADRON) injection 6 mg (has no administration in time range)  remdesivir 200 mg in sodium chloride 0.9% 250 mL IVPB (has no administration in time range)    Followed by  remdesivir 100 mg in sodium chloride 0.9 % 100 mL IVPB (has no administration in time range)   levothyroxine (SYNTHROID) tablet 50 mcg (has no administration in time range)  rosuvastatin (CRESTOR) tablet 10 mg (has no administration in time range)  thiamine (VITAMIN B1) tablet 50 mg (has no administration in time range)  cefTRIAXone (ROCEPHIN) 2 g in sodium chloride 0.9 % 100 mL IVPB (has no administration in time range)  azithromycin (ZITHROMAX) 500 mg in sodium chloride 0.9 % 250 mL IVPB (has no administration in time range)  lactated ringers bolus 500 mL (0 mLs Intravenous Stopped 01/04/23 1301)  ceFEPIme (MAXIPIME) 2 g in sodium chloride 0.9 % 100 mL IVPB (0 g Intravenous Stopped 01/04/23 1302)  metroNIDAZOLE (FLAGYL) IVPB 500 mg (0 mg Intravenous Stopped 01/04/23 1437)  acetaminophen (TYLENOL) tablet 1,000 mg (1,000 mg Oral Given 01/04/23 1327)  lactated ringers bolus 1,000 mL (1,000 mLs Intravenous New Bag/Given 01/04/23 1526)    ED Course/ Medical Decision Making/ A&P                             Medical Decision Making Amount and/or Complexity of Data Reviewed Labs: ordered. Radiology: ordered. ECG/medicine tests: ordered.  Risk OTC drugs. Prescription drug management. Decision regarding hospitalization.   This patient presents to the ED for concern of fall, this involves an extensive number of treatment options, and is a complaint that carries with it a high risk of complications and morbidity.  The differential diagnosis includes acute injuries, syncope, infection, polypharmacy, aspiration   Co morbidities that complicate the patient evaluation  COPD, HLD, hypothyroidism,   Additional history obtained:  Additional  history obtained from EMS External records from outside source obtained and reviewed including EMR   Lab Tests:  I Ordered, and personally interpreted labs.  The pertinent results include: Leukopenia and thrombocytopenia (appear to be chronic), normal kidney function, normal electrolytes, normal troponin, normal lactate.  BNP is moderately elevated.   Patient is found to be COVID-positive.   Imaging Studies ordered:  I ordered imaging studies including x-ray of chest and pelvis, CT imaging of head, cervical spine, chest, abdomen, pelvis, T-spine, L-spine I independently visualized and interpreted imaging which showed no evidence of acute injuries.  Patient is found to have multifocal pneumonia and/or aspiration I agree with the radiologist interpretation   Cardiac Monitoring: / EKG:  The patient was maintained on a cardiac monitor.  I personally viewed and interpreted the cardiac monitored which showed an underlying rhythm of: Atrial fibrillation   Problem List / ED Course / Critical interventions / Medication management  Patient presents after ground-level fall.  During this fall, he fell backwards and struck the back of his head.  He is on warfarin.  He arrives as level 2 trauma.  EMS reports that nursing facility noted that he has had recent fatigue and subjective fever.  He did stay in the bed all day yesterday.  He has a new hypoxic respiratory failure today.  This could be secondary to an aspiration event while he was on the ground.  Additionally, could be secondary to ongoing infection that preceded his fall.  Trauma workup and septic workup were initiated.  Per EMS, nursing facility reports that patient is on warfarin.  This is not documented in EMR.  He did have a recent cardiology visit where he was noted to be on aspirin only. Patient was found to be febrile.  He was given IV fluids and broad-spectrum antibiotics.  On imaging studies, he does not appear to have any acute injuries.  He does have evidence of pneumonia.  Given his lethargy yesterday, I suspect that this preceded his fall.  Given EMS report, he also likely aspirated while on the ground.  He was able to be weaned down to 4 L of supplemental oxygen.  He remained hemodynamically stable.  He was admitted to medicine for further management. I ordered medication including IV  fluids for hydration; broad-spectrum antibiotics for febrile illness Reevaluation of the patient after these medicines showed that the patient improved I have reviewed the patients home medicines and have made adjustments as needed   Social Determinants of Health:  Resides in nursing facility  CRITICAL CARE Performed by: Gloris Manchester   Total critical care time: 32 minutes  Critical care time was exclusive of separately billable procedures and treating other patients.  Critical care was necessary to treat or prevent imminent or life-threatening deterioration.  Critical care was time spent personally by me on the following activities: development of treatment plan with patient and/or surrogate as well as nursing, discussions with consultants, evaluation of patient's response to treatment, examination of patient, obtaining history from patient or surrogate, ordering and performing treatments and interventions, ordering and review of laboratory studies, ordering and review of radiographic studies, pulse oximetry and re-evaluation of patient's condition.         Final Clinical Impression(s) / ED Diagnoses Final diagnoses:  Multifocal pneumonia  Acute respiratory failure with hypoxia [facility])    Rx / DC Orders ED Discharge Orders     None         Gloris Manchester, MD 01/04/23 1723

## 2023-01-04 NOTE — H&P (Addendum)
History and Physical   Jason Barton:[id number] DOB: 05-Apr-1926 DOA: 01/04/2023  PCP: Corwin Levins, MD   Patient coming from: [facility]  Chief Complaint: Fall, Respiratory failure  HPI: Jason Barton is a 89 y.o. male with medical history significant of TIA, hyperlipidemia, hypothyroidism, COPD, constipation, bradycardia, bladder outlet obstruction, scoliosis, anxiety, low back pain presenting after a fall.  Patient had been feeling well for the past couple days with some lethargy and fevers.  Earlier today he stepped backwards and tried to lean into a closet door which he thought was closed and fell into the closet as it was open.  Unclear if he hit his head or lost consciousness, there was some swelling noted by EMS.  He currently is on warfarin for atrial fibrillation.  Suspicion for aspiration while he was on the floor as he was found in his vomit by EMS who thought he was there for at least several minutes if not longer.  EMS did show 1 episode of bradycardia into the 30s and route.  Initially placed on nonrebreather but able to be weaned to 4 L supplemental oxygen.  Patient denies chills, chest pain, abdominal pain, constipation, diarrhea.  Of note, wife lives in the same facility as patient and has dementia.  ED Course: Vital signs in the ED notable for fever to 1-3.4, blood pressure in the 90s to 140 systolic, heart rate in the 50s to 60s, respiratory rate in the teens to 20s, weaned to 4 L supplemental oxygen to maintain saturations.  Lab workup included CMP with glucose 131, calcium 8.5, protein 5.9.  CBC with mild leukopenia at 3.5, platelets 71 which is near baseline of 90-1 30.  CK normal.  I-STAT lactic acid normal.  Troponin normal with repeat pending.  BNP 465.  Respiratory panel for flu COVID and RSV pending.  Urinalysis with ketones and rare bacteria only.  Blood culture pending.  Chest x-ray showed no acute abnormality, pelvis x-ray showed no acute abnormality, CT  head and CT C-spine showed no acute abnormality but did comment on degenerative disc disease.  CT of the chest abdomen pelvis showed groundglass opacities at the right lung base consistent with infection versus aspiration, also noted was large stool burden, diverticulosis, prostatomegaly, CAD.  CT of the T-spine and L-spine were negative for acute abnormality as well.  Patient received vancomycin, cefepime, Flagyl in the ED as well as 500 cc IV fluid.  Review of Systems: As per HPI otherwise all other systems reviewed and are negative.  Past Medical History:  Diagnosis Date   COPD (chronic obstructive pulmonary disease) (HCC) 05/21/2011   Diverticulosis of colon 05/21/2011   Hypothyroidism    Impaired glucose tolerance 06/08/2012   Increased prostate specific antigen (PSA) velocity 05/24/2011   Nonmelanoma skin cancer 05/21/2011   S/P appendectomy 05/21/2011   S/P inguinal hernia repair 05/21/2011   S/p small bowel obstruction 05/21/2011   Thoracic scoliosis 05/21/2011    Past Surgical History:  Procedure Laterality Date   APPENDECTOMY  1939   COLON RESECTION     2009 for a "kink in colon" adhesions after appendectomy   SHOULDER SURGERY     dr sypher; rotater cuff   STRABISMUS SURGERY Right 11/21/2014   Procedure: REPAIR STRABISMUS RIGHT EYE ;  Surgeon: Verne Carrow, MD;  Location: [facility];  Service: Ophthalmology;  Laterality: Right;   TONSILLECTOMY  1942    Social History  reports that he has never smoked. He has never used  smokeless tobacco. He reports current alcohol use. He reports that he does not use drugs.  Allergies  Allergen Reactions   Augmentin [Amoxicillin-Pot Clavulanate] Rash    Itching rash to all torso 2 days after start augmentin 12/2017   Peanut-Containing Drug Products Rash    Family History  Problem Relation Age of Onset   Arthritis Other    Sudden death Other    Colon cancer Neg Hx    Esophageal cancer Neg Hx    Rectal cancer Neg Hx    Reviewed on admission  Prior to Admission medications   Medication Sig Start Date End Date Taking? Authorizing Provider  alfuzosin (UROXATRAL) 10 MG 24 hr tablet Take 1 tablet (10 mg total) by mouth daily with breakfast. 02/05/20   Etta Grandchild, MD  Ascorbic Acid (VITAMIN C WITH ROSE HIPS) 500 MG tablet Take 500 mg by mouth daily.    [provider]  ASPIRIN LOW DOSE 81 MG tablet TAKE 1 TABLET BY MOUTH DAILY. SWALLOW WHOLE. 11/08/21   Corwin Levins, MD  Cholecalciferol (VITAMIN D PO) Take 5,000 Units by mouth every morning.    [provider]  Iron-FA-B Cmp-C-Biot-Probiotic (FUSION PLUS) CAPS Take 1 capsule by mouth daily. 02/14/20   Etta Grandchild, MD  levothyroxine (SYNTHROID) 50 MCG tablet TAKE 1 TABLET BY MOUTH EVERY DAY BEFORE BREAKFAST 03/28/22   Corwin Levins, MD  polyethylene glycol (MIRALAX / GLYCOLAX) 17 g packet Take 17 g by mouth daily.    [provider]  rosuvastatin (CRESTOR) 10 MG tablet TAKE 1 TABLET BY MOUTH EVERY DAY 08/16/22   Corwin Levins, MD  selenium 50 MCG TABS tablet Take 50 mcg by mouth daily.    [provider]  Specialty Vitamins Products (PROSTATE PO) Take by mouth daily.    [provider]  thiamine 50 MG tablet Take 1 tablet (50 mg total) by mouth daily. 02/14/20   Etta Grandchild, MD  Zinc 50 MG TABS Take by mouth.    [provider]    Physical Exam: Vitals:   01/04/23 1400 01/04/23 1443 01/04/23 1511 01/04/23 1524  BP: (!) 90/57  (!) 79/42 (!) 99/47  Pulse: 68  (!) 46 (!) 56  Resp: (!) 28  (!) 22 (!) 21  Temp:  98.3 F (36.8 C) 99.5 F (37.5 C)   TempSrc:  Oral Rectal   SpO2: 98%  100% 100%    Physical Exam Constitutional:      General: He is not in acute distress.    Appearance: Normal appearance.  HENT:     Head: Normocephalic and atraumatic.     Mouth/Throat:     Mouth: Mucous membranes are moist.     Pharynx: Oropharynx is clear.  Eyes:     Extraocular Movements: Extraocular movements  intact.     Pupils: Pupils are equal, round, and reactive to light.  Cardiovascular:     Rate and Rhythm: Regular rhythm. Bradycardia present.     Pulses: Normal pulses.     Heart sounds: Normal heart sounds.  Pulmonary:     Effort: Pulmonary effort is normal. No respiratory distress.     Breath sounds: Normal breath sounds.  Abdominal:     General: Bowel sounds are normal. There is no distension.     Palpations: Abdomen is soft.     Tenderness: There is no abdominal tenderness.  Musculoskeletal:        General: No swelling or deformity.  Skin:  General: Skin is warm and dry.  Neurological:     General: No focal deficit present.     Mental Status: Mental status is at baseline.    Labs on Admission: I have personally reviewed following labs and imaging studies  CBC: Recent Labs  Lab 01/04/23 1130 01/04/23 1139  WBC 3.5*  --   HGB 13.4 13.3  HCT 40.7 39.0  MCV 91.9  --   PLT 71*  --     Basic Metabolic Panel: Recent Labs  Lab 01/04/23 1130 01/04/23 1139  NA 136 137  K 3.8 3.7  CL 100 100  CO2 23  --   GLUCOSE 131* 127*  BUN 18 20  CREATININE 1.00 0.90  CALCIUM 8.5*  --     GFR: CrCl cannot be calculated (Unknown ideal weight.).  Liver Function Tests: Recent Labs  Lab 01/04/23 1130  AST 27  ALT 19  ALKPHOS 65  BILITOT 0.6  PROT 5.9*  ALBUMIN 3.6    Urine analysis:    Component Value Date/Time   COLORURINE YELLOW 01/04/2023 1118   APPEARANCEUR CLEAR 01/04/2023 1118   LABSPEC 1.020 01/04/2023 1118   PHURINE 5.0 01/04/2023 1118   GLUCOSEU NEGATIVE 01/04/2023 1118   GLUCOSEU NEGATIVE 08/28/2020 1000   HGBUR NEGATIVE 01/04/2023 1118   BILIRUBINUR NEGATIVE 01/04/2023 1118   KETONESUR 5 (A) 01/04/2023 1118   PROTEINUR NEGATIVE 01/04/2023 1118   UROBILINOGEN 0.2 08/28/2020 1000   NITRITE NEGATIVE 01/04/2023 1118   LEUKOCYTESUR NEGATIVE 01/04/2023 1118    Radiological Exams on Admission: CT HEAD WO CONTRAST  Result Date: 01/04/2023 CLINICAL  DATA:  Fall EXAM: CT HEAD WITHOUT CONTRAST CT CERVICAL SPINE WITHOUT CONTRAST TECHNIQUE: Multidetector CT imaging of the head and cervical spine was performed following the standard protocol without intravenous contrast. Multiplanar CT image reconstructions of the cervical spine were also generated. RADIATION DOSE REDUCTION: This exam was performed according to the departmental dose-optimization program which includes automated exposure control, adjustment of the mA and/or kV according to patient size and/or use of iterative reconstruction technique. COMPARISON:  06/23/2021 FINDINGS: CT HEAD FINDINGS Brain: No evidence of acute infarction, hemorrhage, hydrocephalus, extra-axial collection or mass lesion/mass effect. Periventricular and deep white matter hypodensity. Vascular: No hyperdense vessel or unexpected calcification. Skull: Normal. Negative for fracture or focal lesion. Sinuses/Orbits: No acute finding. Other: Soft tissue contusion of the right forehead (series 3, image 22). CT CERVICAL SPINE FINDINGS Alignment: Normal. Skull base and vertebrae: No acute fracture. No primary bone lesion or focal pathologic process. Soft tissues and spinal canal: No prevertebral fluid or swelling. No visible canal hematoma. Disc levels: Mild-to-moderate disc space height loss and osteophytosis, worst at C3-C4. Upper chest: Negative. Other: None. IMPRESSION: 1. No acute intracranial pathology. Small-vessel white matter disease. 2. Soft tissue contusion of the right forehead. 3. No fracture or static subluxation of the cervical spine. 4. Mild-to-moderate cervical disc degenerative disease. Electronically Signed   By: Jearld Lesch M.D.   On: 01/04/2023 13:29   CT CERVICAL SPINE WO CONTRAST  Result Date: 01/04/2023 CLINICAL DATA:  Fall EXAM: CT HEAD WITHOUT CONTRAST CT CERVICAL SPINE WITHOUT CONTRAST TECHNIQUE: Multidetector CT imaging of the head and cervical spine was performed following the standard protocol without  intravenous contrast. Multiplanar CT image reconstructions of the cervical spine were also generated. RADIATION DOSE REDUCTION: This exam was performed according to the departmental dose-optimization program which includes automated exposure control, adjustment of the mA and/or kV according to patient size and/or use of iterative reconstruction technique.  COMPARISON:  06/23/2021 FINDINGS: CT HEAD FINDINGS Brain: No evidence of acute infarction, hemorrhage, hydrocephalus, extra-axial collection or mass lesion/mass effect. Periventricular and deep white matter hypodensity. Vascular: No hyperdense vessel or unexpected calcification. Skull: Normal. Negative for fracture or focal lesion. Sinuses/Orbits: No acute finding. Other: Soft tissue contusion of the right forehead (series 3, image 22). CT CERVICAL SPINE FINDINGS Alignment: Normal. Skull base and vertebrae: No acute fracture. No primary bone lesion or focal pathologic process. Soft tissues and spinal canal: No prevertebral fluid or swelling. No visible canal hematoma. Disc levels: Mild-to-moderate disc space height loss and osteophytosis, worst at C3-C4. Upper chest: Negative. Other: None. IMPRESSION: 1. No acute intracranial pathology. Small-vessel white matter disease. 2. Soft tissue contusion of the right forehead. 3. No fracture or static subluxation of the cervical spine. 4. Mild-to-moderate cervical disc degenerative disease. Electronically Signed   By: Jearld Lesch M.D.   On: 01/04/2023 13:29   CT CHEST ABDOMEN PELVIS WO CONTRAST  Result Date: 01/04/2023 CLINICAL DATA:  Fall EXAM: CT CHEST, ABDOMEN, AND PELVIS WITHOUT CONTRAST CT THORACIC AND LUMBAR SPINE WITHOUT CONTRAST TECHNIQUE: Multidetector CT imaging of the chest, abdomen and pelvis was performed following the standard protocol without administration of intravenous contrast. Multidetector CT imaging of the thoracic and lumbar spine was performed following the standard protocol without  administration of intravenous contrast. RADIATION DOSE REDUCTION: This exam was performed according to the departmental dose-optimization program which includes automated exposure control, adjustment of the mA and/or kV according to patient size and/or use of iterative reconstruction technique. COMPARISON:  None Available. FINDINGS: CT CHEST FINDINGS Cardiovascular: Aortic atherosclerosis. Normal heart size. Left coronary artery calcifications. No pericardial effusion. Mediastinum/Nodes: No enlarged mediastinal, hilar, or axillary lymph nodes. Thyroid gland, trachea, and esophagus demonstrate no significant findings. Lungs/Pleura: Scattered ground-glass airspace opacities throughout the right lung base (series 4, image 69). No pleural effusion or pneumothorax. Musculoskeletal: No chest wall mass or suspicious osseous lesions identified. CT ABDOMEN PELVIS FINDINGS Hepatobiliary: No solid liver abnormality is seen. No gallstones, gallbladder wall thickening, or biliary dilatation. Pancreas: Unremarkable. No pancreatic ductal dilatation or surrounding inflammatory changes. Spleen: Normal in size without significant abnormality. Adrenals/Urinary Tract: Adrenal glands are unremarkable. Kidneys are normal, without renal calculi, solid lesion, or hydronephrosis. Bladder is unremarkable. Stomach/Bowel: Stomach is within normal limits. Appendix appears normal. No evidence of bowel wall thickening, distention, or inflammatory changes. Descending and sigmoid diverticulosis. Large burden of stool throughout the colon and rectum. Vascular/Lymphatic: Aortic atherosclerosis. No enlarged abdominal or pelvic lymph nodes. Reproductive: Prostatomegaly. Other: Small, fat containing midline epigastric hernia (series 3, image 83). No ascites. Musculoskeletal: No acute osseous findings. CT THORACIC AND LUMBAR SPINE FINDINGS Alignment: Gentle dextroscoliosis of the thoracic spine, with otherwise normal thoracic kyphosis. Normal lumbar  lordosis. Vertebral bodies: Intact. No fracture or dislocation. Disc spaces: Mild disc space height loss and osteophytosis throughout the thoracic spine. Mild disc degenerative change L1-L2 with otherwise intact lumbar disc spaces. Mild facet degenerative change of the lower lumbar levels. Paraspinous soft tissues: Unremarkable. IMPRESSION: 1. Scattered ground-glass airspace opacities throughout the right lung base, consistent with infection or aspiration. 2. No noncontrast CT evidence of acute traumatic injury to the chest, abdomen, or pelvis. 3. No fracture or dislocation of the thoracic or lumbar spine. 4. Descending and sigmoid diverticulosis. 5. Large burden of stool throughout the colon and rectum. 6. Prostatomegaly. 7. Coronary artery disease. Aortic Atherosclerosis (ICD10-I70.0). Electronically Signed   By: Jearld Lesch M.D.   On: 01/04/2023 13:23   CT T-SPINE  NO CHARGE  Result Date: 01/04/2023 CLINICAL DATA:  Fall EXAM: CT CHEST, ABDOMEN, AND PELVIS WITHOUT CONTRAST CT THORACIC AND LUMBAR SPINE WITHOUT CONTRAST TECHNIQUE: Multidetector CT imaging of the chest, abdomen and pelvis was performed following the standard protocol without administration of intravenous contrast. Multidetector CT imaging of the thoracic and lumbar spine was performed following the standard protocol without administration of intravenous contrast. RADIATION DOSE REDUCTION: This exam was performed according to the departmental dose-optimization program which includes automated exposure control, adjustment of the mA and/or kV according to patient size and/or use of iterative reconstruction technique. COMPARISON:  None Available. FINDINGS: CT CHEST FINDINGS Cardiovascular: Aortic atherosclerosis. Normal heart size. Left coronary artery calcifications. No pericardial effusion. Mediastinum/Nodes: No enlarged mediastinal, hilar, or axillary lymph nodes. Thyroid gland, trachea, and esophagus demonstrate no significant findings.  Lungs/Pleura: Scattered ground-glass airspace opacities throughout the right lung base (series 4, image 69). No pleural effusion or pneumothorax. Musculoskeletal: No chest wall mass or suspicious osseous lesions identified. CT ABDOMEN PELVIS FINDINGS Hepatobiliary: No solid liver abnormality is seen. No gallstones, gallbladder wall thickening, or biliary dilatation. Pancreas: Unremarkable. No pancreatic ductal dilatation or surrounding inflammatory changes. Spleen: Normal in size without significant abnormality. Adrenals/Urinary Tract: Adrenal glands are unremarkable. Kidneys are normal, without renal calculi, solid lesion, or hydronephrosis. Bladder is unremarkable. Stomach/Bowel: Stomach is within normal limits. Appendix appears normal. No evidence of bowel wall thickening, distention, or inflammatory changes. Descending and sigmoid diverticulosis. Large burden of stool throughout the colon and rectum. Vascular/Lymphatic: Aortic atherosclerosis. No enlarged abdominal or pelvic lymph nodes. Reproductive: Prostatomegaly. Other: Small, fat containing midline epigastric hernia (series 3, image 83). No ascites. Musculoskeletal: No acute osseous findings. CT THORACIC AND LUMBAR SPINE FINDINGS Alignment: Gentle dextroscoliosis of the thoracic spine, with otherwise normal thoracic kyphosis. Normal lumbar lordosis. Vertebral bodies: Intact. No fracture or dislocation. Disc spaces: Mild disc space height loss and osteophytosis throughout the thoracic spine. Mild disc degenerative change L1-L2 with otherwise intact lumbar disc spaces. Mild facet degenerative change of the lower lumbar levels. Paraspinous soft tissues: Unremarkable. IMPRESSION: 1. Scattered ground-glass airspace opacities throughout the right lung base, consistent with infection or aspiration. 2. No noncontrast CT evidence of acute traumatic injury to the chest, abdomen, or pelvis. 3. No fracture or dislocation of the thoracic or lumbar spine. 4. Descending  and sigmoid diverticulosis. 5. Large burden of stool throughout the colon and rectum. 6. Prostatomegaly. 7. Coronary artery disease. Aortic Atherosclerosis (ICD10-I70.0). Electronically Signed   By: Jearld Lesch M.D.   On: 01/04/2023 13:23   CT L-SPINE NO CHARGE  Result Date: 01/04/2023 CLINICAL DATA:  Fall EXAM: CT CHEST, ABDOMEN, AND PELVIS WITHOUT CONTRAST CT THORACIC AND LUMBAR SPINE WITHOUT CONTRAST TECHNIQUE: Multidetector CT imaging of the chest, abdomen and pelvis was performed following the standard protocol without administration of intravenous contrast. Multidetector CT imaging of the thoracic and lumbar spine was performed following the standard protocol without administration of intravenous contrast. RADIATION DOSE REDUCTION: This exam was performed according to the departmental dose-optimization program which includes automated exposure control, adjustment of the mA and/or kV according to patient size and/or use of iterative reconstruction technique. COMPARISON:  None Available. FINDINGS: CT CHEST FINDINGS Cardiovascular: Aortic atherosclerosis. Normal heart size. Left coronary artery calcifications. No pericardial effusion. Mediastinum/Nodes: No enlarged mediastinal, hilar, or axillary lymph nodes. Thyroid gland, trachea, and esophagus demonstrate no significant findings. Lungs/Pleura: Scattered ground-glass airspace opacities throughout the right lung base (series 4, image 69). No pleural effusion or pneumothorax. Musculoskeletal: No chest wall mass or  suspicious osseous lesions identified. CT ABDOMEN PELVIS FINDINGS Hepatobiliary: No solid liver abnormality is seen. No gallstones, gallbladder wall thickening, or biliary dilatation. Pancreas: Unremarkable. No pancreatic ductal dilatation or surrounding inflammatory changes. Spleen: Normal in size without significant abnormality. Adrenals/Urinary Tract: Adrenal glands are unremarkable. Kidneys are normal, without renal calculi, solid lesion, or  hydronephrosis. Bladder is unremarkable. Stomach/Bowel: Stomach is within normal limits. Appendix appears normal. No evidence of bowel wall thickening, distention, or inflammatory changes. Descending and sigmoid diverticulosis. Large burden of stool throughout the colon and rectum. Vascular/Lymphatic: Aortic atherosclerosis. No enlarged abdominal or pelvic lymph nodes. Reproductive: Prostatomegaly. Other: Small, fat containing midline epigastric hernia (series 3, image 83). No ascites. Musculoskeletal: No acute osseous findings. CT THORACIC AND LUMBAR SPINE FINDINGS Alignment: Gentle dextroscoliosis of the thoracic spine, with otherwise normal thoracic kyphosis. Normal lumbar lordosis. Vertebral bodies: Intact. No fracture or dislocation. Disc spaces: Mild disc space height loss and osteophytosis throughout the thoracic spine. Mild disc degenerative change L1-L2 with otherwise intact lumbar disc spaces. Mild facet degenerative change of the lower lumbar levels. Paraspinous soft tissues: Unremarkable. IMPRESSION: 1. Scattered ground-glass airspace opacities throughout the right lung base, consistent with infection or aspiration. 2. No noncontrast CT evidence of acute traumatic injury to the chest, abdomen, or pelvis. 3. No fracture or dislocation of the thoracic or lumbar spine. 4. Descending and sigmoid diverticulosis. 5. Large burden of stool throughout the colon and rectum. 6. Prostatomegaly. 7. Coronary artery disease. Aortic Atherosclerosis (ICD10-I70.0). Electronically Signed   By: Jearld Lesch M.D.   On: 01/04/2023 13:23   DG Chest Port 1 View  Result Date: 01/04/2023 CLINICAL DATA:  Level 2 trauma. Patient fell through door way. Complains of shortness of breath, fever and COVID exposure. EXAM: PORTABLE CHEST 1 VIEW COMPARISON:  02/05/20 FINDINGS: Heart size and mediastinal contours are normal. Aortic atherosclerosis. Lungs appear hyperinflated with by lateral upper lobe scarring. No superimposed pleural  effusion, interstitial edema or airspace consolidation. Curvature of the thoracic spine is convex towards the right. No acute osseous findings. IMPRESSION: 1. No acute cardiopulmonary disease. 2.  Aortic Atherosclerosis (ICD10-I70.0). Electronically Signed   By: Signa Kell M.D.   On: 01/04/2023 12:18   DG Pelvis Portable  Result Date: 01/04/2023 CLINICAL DATA:  Fall. EXAM: PORTABLE PELVIS 1-2 VIEWS COMPARISON:  None Available. FINDINGS: There is no evidence of pelvic fracture or diastasis. No pelvic bone lesions are seen. IMPRESSION: Negative. Electronically Signed   By: Obie Dredge M.D.   On: 01/04/2023 12:13    EKG: Independently reviewed.  Sinus rhythm at 60 bpm.  PACs noted.  Baseline artifact and baseline wander.  Nonspecific T wave changes.  Assessment/Plan Principal Problem:   Acute respiratory failure with hypoxia (HCC) Active Problems:   COPD (chronic obstructive pulmonary disease) (HCC)   Hyperlipidemia   Hypothyroidism   Bladder outlet obstruction   History of TIA (transient ischemic attack)   Anxiety   COVID-19 virus infection   Hypotension   Acute respiratory failure with hypoxia Aspiration versus PNA Febrile illness (Viral vs Bacterial > Present after fall at facility, EMS noted that he may have aspirated on vomit that he was found in on the scene. > Initially requiring nonrebreather but weaned to 4 L in the ED. > Chest x-ray with out acute normality but CT showed groundglass opacity at the right lung base consistent with infection versus aspiration.  Did have fevers 2 days prior to event, but unclear if chest imaging changes represent aspiration versus pneumonia. > Respiratory panel  for flu COVID RSV pending.  Started on vancomycin, cefepime, Flagyl in the ED. - Monitor on telemetry - Supplemental oxygen, wean as tolerated - Maintenance IV fluids - Follow-up viral panel - Consider RVP for further evaluation of viral etiology if viral panel negative given no  leukocytosis - Hold off on further antibiotics for now - Procalcitonin - Supportive care ADDENDUM Hypotension COVID-19 infection > Patient results came back positive for COVID-19.  This likely explains his fever prior to event.  Symptoms ongoing for 3 days. > Also with worsening blood pressure into the 80s systolic.  With some intermittent worsening bradycardia as below. > Leukopenia and thrombocytopenia may be related to this - Upgrade patient to progressive bed - 1 L IV fluid bolus, monitor response - Continue IV fluids after bolus - Start Decadron - Start remdesivir for moderate to severe COVID with infiltrate and oxygen requirement  Bradycardia > Known trigger of bradycardia typically his heart rate in the low 50s. > Heart rate in the 40s primarily in the ED some drops into the 30s.  Remains asymptomatic. > If patient becomes symptomatic or has persistent heart rate in the 30s or prolonged pauses will consult cardiology. - Continue to monitor closely on telemetry  - Avoid nodal blocking agents  Fall > Fall at facility today.  Thought he was leaning against a closet door that was closed but it was not and he fell into the closet. > Imaging in ED negative for any acute trauma and acute abnormalities. - Continue to monitor  History of TIA Hyperlipidemia - Continue home aspirin, rosuvastatin  Hypothyroidism - Continue home Synthroid  Bladder outlet obstruction - Hold home alfuzosin considering low BP  History of COPD > Listed in chart but not on any medication   Thrombocytopenia > Platelets currently 71 down from baseline of 90-1 30. - Will hold off on chemoprophylaxis for DVT  DVT prophylaxis: SCDs Code Status:   Full  Family Communication:  Updated at bedside  Disposition Plan:   Patient is from:  [facility]  Anticipated DC to:  Same as above  Anticipated DC date:  1 to 3 days  Anticipated DC barriers: None  Consults called:  None Admission status:   Observation, telemetry  Severity of Illness: The appropriate patient status for this patient is OBSERVATION. Observation status is judged to be reasonable and necessary in order to provide the required intensity of service to ensure the patient's safety. The patient's presenting symptoms, physical exam findings, and initial radiographic and laboratory data in the context of their medical condition is felt to place them at decreased risk for further clinical deterioration. Furthermore, it is anticipated that the patient will be medically stable for discharge from the hospital within 2 midnights of admission.    Synetta Fail MD [facility]  How to contact the [facility] Attending or Consulting provider 7A - 7P or covering provider during after hours 7P -7A, for this patient?   Check the care team in [facility] and look for a) attending/consulting [facility] provider listed and b) the [facility] team listed Log into [email or web address] and use [place]'s universal password to access. If you do not have the password, please contact the hospital operator. Locate the [facility] provider you are looking for under [facility] and page to a number that you can be directly reached. If you still have difficulty reaching the provider, please page the [facility] (Director on Call) for the Hospitalists listed on amion for assistance.  01/04/2023, 4:44 PM

## 2023-01-04 NOTE — ED Notes (Signed)
ED TO INPATIENT HANDOFF REPORT  ED Nurse Name and Phone #: Delfin Edis / [phone]  S Name/Age/Gender Jason Barton 89 y.o. male Room/Bed: 012C/012C  Code Status   Code Status: Full Code  Home/SNF/Other Home Patient oriented to: self, place, time, and situation Is this baseline? Yes   Triage Complete: Triage complete  Chief Complaint Acute respiratory failure with hypoxia [facility]) [J96.01]  Triage Note Pt to ED via EMS from [employer] at [facility]. Pt had mechanical fall at facility, pt stepped backwards and fell into closet. Unsure if pt hit his head, unsure of LOC. Pt is on warfarin. Pt has hx of a fib. Pt aspirated while laying on floor on his own vomit. Airway patent. Pt has also been lethargic at home x2 days with fevers. Pt reports being around Covid + people. Pt had brief episode of bradycardia with HR of 30 in a fib with EMS for ~5 mins. Pt was originally 88% on RA with EMS. EMS placed pt on NRB. Pt came up to 100% on NRB with EMS. Pt has hx of dementia baseline.    EMS: 88% RA 65 HR 202 CBG 30 RR 18 R Wrist   Pt denies pain. Pt's only c/o nausea. Pt in c-collar upon arrival to ED.    Allergies Allergies  Allergen Reactions   Augmentin [Amoxicillin-Pot Clavulanate] Rash    Itching rash to all torso 2 days after start augmentin 12/2017   Peanut-Containing Drug Products Rash    Level of Care/Admitting Diagnosis ED Disposition     ED Disposition  Admit   Condition  --   Comment  Hospital Area: [facility] [[id number]]  Level of Care: Progressive [102]  Admit to Progressive based on following criteria: RESPIRATORY PROBLEMS hypoxemic/hypercapnic respiratory failure that is responsive to NIPPV (BiPAP) or High Flow Nasal Cannula (6-80 lpm). Frequent assessment/intervention, no > Q2 hrs < Q4 hrs, to maintain oxygenation and pulmonary hygiene.  Admit to Progressive based on following criteria: MULTISYSTEM THREATS such as stable sepsis,  metabolic/electrolyte imbalance with or without encephalopathy that is responding to early treatment.  May place patient in observation at [facility] or [facility] if equivalent level of care is available:: No  Covid Evaluation: Confirmed COVID Positive  Diagnosis: Acute respiratory failure with hypoxia [facility]) [[id number]]  Admitting Physician: Synetta Fail [[id number]]  Attending Physician: Synetta Fail [[id number]]          B Medical/Surgery History Past Medical History:  Diagnosis Date   COPD (chronic obstructive pulmonary disease) (HCC) 05/21/2011   Diverticulosis of colon 05/21/2011   Hypothyroidism    Impaired glucose tolerance 06/08/2012   Increased prostate specific antigen (PSA) velocity 05/24/2011   Nonmelanoma skin cancer 05/21/2011   S/P appendectomy 05/21/2011   S/P inguinal hernia repair 05/21/2011   S/p small bowel obstruction 05/21/2011   Thoracic scoliosis 05/21/2011   Past Surgical History:  Procedure Laterality Date   APPENDECTOMY  1939   COLON RESECTION     2009 for a "kink in colon" adhesions after appendectomy   SHOULDER SURGERY     dr sypher; rotater cuff   STRABISMUS SURGERY Right 11/21/2014   Procedure: REPAIR STRABISMUS RIGHT EYE ;  Surgeon: Verne Carrow, MD;  Location: [facility];  Service: Ophthalmology;  Laterality: Right;   TONSILLECTOMY  1942     A IV Location/Drains/Wounds Patient Lines/Drains/Airways Status     Active Line/Drains/Airways     Name Placement date Placement time Site Days  Peripheral IV 01/04/23 18 G Anterior;Right Forearm 01/04/23  1118  Forearm  less than 1   Peripheral IV 01/04/23 18 G Right Antecubital 01/04/23  1133  Antecubital  less than 1   Incision (Closed) 11/21/14 Eye Right 11/21/14  1025  -- 2966            Intake/Output Last 24 hours  Intake/Output Summary (Last 24 hours) at 01/04/2023 2014 Last data filed at 01/04/2023 1437 Gross per 24 hour  Intake 700 ml  Output --  Net 700 ml     Labs/Imaging Results for orders placed or performed during the hospital encounter of 01/04/23 (from the past 48 hour(s))  Urinalysis, w/ Reflex to Culture (Infection Suspected) -Urine, Clean Catch     Status: Abnormal   Collection Time: 01/04/23 11:18 AM  Result Value Ref Range   Specimen Source URINE, CLEAN CATCH    Color, Urine YELLOW YELLOW   APPearance CLEAR CLEAR   Specific Gravity, Urine 1.020 1.005 - 1.030   pH 5.0 5.0 - 8.0   Glucose, UA NEGATIVE NEGATIVE mg/dL   Hgb urine dipstick NEGATIVE NEGATIVE   Bilirubin Urine NEGATIVE NEGATIVE   Ketones, ur 5 (A) NEGATIVE mg/dL   Protein, ur NEGATIVE NEGATIVE mg/dL   Nitrite NEGATIVE NEGATIVE   Leukocytes,Ua NEGATIVE NEGATIVE   RBC / HPF 0-5 0 - 5 RBC/hpf   WBC, UA 0-5 0 - 5 WBC/hpf    Comment:        Reflex urine culture not performed if WBC <=10, OR if Squamous epithelial cells >5. If Squamous epithelial cells >5 suggest recollection.    Bacteria, UA RARE (A) NONE SEEN   Squamous Epithelial / HPF 0-5 0 - 5 /HPF   Mucus PRESENT     Comment: Performed at [facility], [address] [place], [place] [address]  Resp panel by RT-PCR (RSV, Flu A&B, Covid) Anterior Nasal Swab     Status: Abnormal   Collection Time: 01/04/23 11:20 AM   Specimen: Anterior Nasal Swab  Result Value Ref Range   SARS Coronavirus 2 by RT PCR POSITIVE (A) NEGATIVE   Influenza A by PCR NEGATIVE NEGATIVE   Influenza B by PCR NEGATIVE NEGATIVE    Comment: (NOTE) The Xpert Xpress SARS-CoV-2/FLU/RSV plus assay is intended as an aid in the diagnosis of influenza from Nasopharyngeal swab specimens and should not be used as a sole basis for treatment. Nasal washings and aspirates are unacceptable for Xpert Xpress SARS-CoV-2/FLU/RSV testing.  Fact Sheet for Patients: BloggerCourse.com  Fact Sheet for Healthcare Providers: SeriousBroker.it  This test is not yet approved or cleared by the [place] FDA and has been authorized for detection and/or diagnosis of SARS-CoV-2 by FDA under an Emergency Use Authorization (EUA). This EUA will remain in effect (meaning this test can be used) for the duration of the COVID-19 declaration under Section 564(b)(1) of the Act, 21 U.S.C. section 360bbb-3(b)(1), unless the authorization is terminated or revoked.     Resp Syncytial Virus by PCR NEGATIVE NEGATIVE    Comment: (NOTE) Fact Sheet for Patients: BloggerCourse.com  Fact Sheet for Healthcare Providers: SeriousBroker.it  This test is not yet approved or cleared by the [place] FDA and has been authorized for detection and/or diagnosis of SARS-CoV-2 by FDA under an Emergency Use Authorization (EUA). This EUA will remain in effect (meaning this test can be used) for the duration of the COVID-19 declaration under Section 564(b)(1) of the Act, 21 U.S.C. section 360bbb-3(b)(1), unless the authorization  is terminated or revoked.  Performed at [facility], [address] [place], [place] [address]   Comprehensive metabolic panel     Status: Abnormal   Collection Time: 01/04/23 11:30 AM  Result Value Ref Range   Sodium 136 135 - 145 mmol/L   Potassium 3.8 3.5 - 5.1 mmol/L   Chloride 100 98 - 111 mmol/L   CO2 23 22 - 32 mmol/L   Glucose, Bld 131 (H) 70 - 99 mg/dL    Comment: Glucose reference range applies only to samples taken after fasting for at least 8 hours.   BUN 18 8 - 23 mg/dL   Creatinine, Ser 0.98 0.61 - 1.24 mg/dL   Calcium 8.5 (L) 8.9 - 10.3 mg/dL   Total Protein 5.9 (L) 6.5 - 8.1 g/dL   Albumin 3.6 3.5 - 5.0 g/dL   AST 27 15 - 41 U/L   ALT 19 0 - 44 U/L   Alkaline Phosphatase 65 38 - 126 U/L   Total Bilirubin 0.6 0.3 - 1.2 mg/dL   GFR, Estimated >11 >91 mL/min    Comment: (NOTE) Calculated using the CKD-EPI Creatinine Equation (2021)    Anion gap 13 5 - 15    Comment: Performed at [facility]  Lab, [address] [place], [place] [address]  CBC     Status: Abnormal   Collection Time: 01/04/23 11:30 AM  Result Value Ref Range   WBC 3.5 (L) 4.0 - 10.5 K/uL   RBC 4.43 4.22 - 5.81 MIL/uL   Hemoglobin 13.4 13.0 - 17.0 g/dL   HCT 56.2 13.0 - 86.5 %   MCV 91.9 80.0 - 100.0 fL   MCH 30.2 26.0 - 34.0 pg   MCHC 32.9 30.0 - 36.0 g/dL   RDW 78.4 69.6 - 29.5 %   Platelets 71 (L) 150 - 400 K/uL    Comment: Immature Platelet Fraction may be clinically indicated, consider ordering this additional test [id number] REPEATED TO VERIFY    nRBC 0.0 0.0 - 0.2 %    Comment: Performed at [facility], [address] [place], [place] [address]  Protime-INR     Status: None   Collection Time: 01/04/23 11:30 AM  Result Value Ref Range   Prothrombin Time 14.6 11.4 - 15.2 seconds   INR 1.1 0.8 - 1.2    Comment: (NOTE) INR goal varies based on device and disease states. Performed at [facility], [address], [place], [place] [address]   CK     Status: None   Collection Time: 01/04/23 11:30 AM  Result Value Ref Range   Total CK 107 49 - 397 U/L    Comment: Performed at [facility], [address] [place], [place] [address]  Troponin I (High Sensitivity)     Status: None   Collection Time: 01/04/23 11:30 AM  Result Value Ref Range   Troponin I (High Sensitivity) 10 <18 ng/L    Comment: (NOTE) Elevated high sensitivity troponin I (hsTnI) values and significant  changes across serial measurements may suggest ACS but many other  chronic and acute conditions are known to elevate hsTnI results.  Refer to the "Links" section for chest pain algorithms and additional  guidance. Performed at [facility], [address] [place], [place] [address]   Brain natriuretic peptide     Status: Abnormal   Collection Time: 01/04/23 11:30 AM  Result Value Ref Range   B Natriuretic Peptide 465.7 (  H) 0.0 - 100.0 pg/mL    Comment: Performed at [facility], [address], [place], [place] [address]  I-Stat Chem 8, ED     Status: Abnormal   Collection Time: 01/04/23 11:39 AM  Result Value Ref Range   Sodium 137 135 - 145 mmol/L   Potassium 3.7 3.5 - 5.1 mmol/L   Chloride 100 98 - 111 mmol/L   BUN 20 8 - 23 mg/dL   Creatinine, Ser 6.04 0.61 - 1.24 mg/dL   Glucose, Bld 540 (H) 70 - 99 mg/dL    Comment: Glucose reference range applies only to samples taken after fasting for at least 8 hours.   Calcium, Ion 1.07 (L) 1.15 - 1.40 mmol/L   TCO2 23 22 - 32 mmol/L   Hemoglobin 13.3 13.0 - 17.0 g/dL   HCT 98.1 19.1 - 47.8 %  I-Stat Lactic Acid, ED     Status: None   Collection Time: 01/04/23 11:40 AM  Result Value Ref Range   Lactic Acid, Venous 1.4 0.5 - 1.9 mmol/L  Troponin I (High Sensitivity)     Status: None   Collection Time: 01/04/23  1:18 PM  Result Value Ref Range   Troponin I (High Sensitivity) 13 <18 ng/L    Comment: (NOTE) Elevated high sensitivity troponin I (hsTnI) values and significant  changes across serial measurements may suggest ACS but many other  chronic and acute conditions are known to elevate hsTnI results.  Refer to the "Links" section for chest pain algorithms and additional  guidance. Performed at [facility], [address], [place], [place] [address]    CT HEAD WO CONTRAST  Result Date: 01/04/2023 CLINICAL DATA:  Fall EXAM: CT HEAD WITHOUT CONTRAST CT CERVICAL SPINE WITHOUT CONTRAST TECHNIQUE: Multidetector CT imaging of the head and cervical spine was performed following the standard protocol without intravenous contrast. Multiplanar CT image reconstructions of the cervical spine were also generated. RADIATION DOSE REDUCTION: This exam was performed according to the departmental dose-optimization program which includes automated exposure control, adjustment of the mA and/or kV according to patient size and/or use of iterative reconstruction technique. COMPARISON:  06/23/2021 FINDINGS: CT HEAD FINDINGS Brain: No evidence of  acute infarction, hemorrhage, hydrocephalus, extra-axial collection or mass lesion/mass effect. Periventricular and deep white matter hypodensity. Vascular: No hyperdense vessel or unexpected calcification. Skull: Normal. Negative for fracture or focal lesion. Sinuses/Orbits: No acute finding. Other: Soft tissue contusion of the right forehead (series 3, image 22). CT CERVICAL SPINE FINDINGS Alignment: Normal. Skull base and vertebrae: No acute fracture. No primary bone lesion or focal pathologic process. Soft tissues and spinal canal: No prevertebral fluid or swelling. No visible canal hematoma. Disc levels: Mild-to-moderate disc space height loss and osteophytosis, worst at C3-C4. Upper chest: Negative. Other: None. IMPRESSION: 1. No acute intracranial pathology. Small-vessel white matter disease. 2. Soft tissue contusion of the right forehead. 3. No fracture or static subluxation of the cervical spine. 4. Mild-to-moderate cervical disc degenerative disease. Electronically Signed   By: Jearld Lesch M.D.   On: 01/04/2023 13:29   CT CERVICAL SPINE WO CONTRAST  Result Date: 01/04/2023 CLINICAL DATA:  Fall EXAM: CT HEAD WITHOUT CONTRAST CT CERVICAL SPINE WITHOUT CONTRAST TECHNIQUE: Multidetector CT imaging of the head and cervical spine was performed following the standard protocol without intravenous contrast. Multiplanar CT image reconstructions of the cervical spine were also generated. RADIATION DOSE REDUCTION: This exam was performed according to the departmental dose-optimization program which includes automated exposure control,  adjustment of the mA and/or kV according to patient size and/or use of iterative reconstruction technique. COMPARISON:  06/23/2021 FINDINGS: CT HEAD FINDINGS Brain: No evidence of acute infarction, hemorrhage, hydrocephalus, extra-axial collection or mass lesion/mass effect. Periventricular and deep white matter hypodensity. Vascular: No hyperdense vessel or unexpected  calcification. Skull: Normal. Negative for fracture or focal lesion. Sinuses/Orbits: No acute finding. Other: Soft tissue contusion of the right forehead (series 3, image 22). CT CERVICAL SPINE FINDINGS Alignment: Normal. Skull base and vertebrae: No acute fracture. No primary bone lesion or focal pathologic process. Soft tissues and spinal canal: No prevertebral fluid or swelling. No visible canal hematoma. Disc levels: Mild-to-moderate disc space height loss and osteophytosis, worst at C3-C4. Upper chest: Negative. Other: None. IMPRESSION: 1. No acute intracranial pathology. Small-vessel white matter disease. 2. Soft tissue contusion of the right forehead. 3. No fracture or static subluxation of the cervical spine. 4. Mild-to-moderate cervical disc degenerative disease. Electronically Signed   By: Jearld Lesch M.D.   On: 01/04/2023 13:29   CT CHEST ABDOMEN PELVIS WO CONTRAST  Result Date: 01/04/2023 CLINICAL DATA:  Fall EXAM: CT CHEST, ABDOMEN, AND PELVIS WITHOUT CONTRAST CT THORACIC AND LUMBAR SPINE WITHOUT CONTRAST TECHNIQUE: Multidetector CT imaging of the chest, abdomen and pelvis was performed following the standard protocol without administration of intravenous contrast. Multidetector CT imaging of the thoracic and lumbar spine was performed following the standard protocol without administration of intravenous contrast. RADIATION DOSE REDUCTION: This exam was performed according to the departmental dose-optimization program which includes automated exposure control, adjustment of the mA and/or kV according to patient size and/or use of iterative reconstruction technique. COMPARISON:  None Available. FINDINGS: CT CHEST FINDINGS Cardiovascular: Aortic atherosclerosis. Normal heart size. Left coronary artery calcifications. No pericardial effusion. Mediastinum/Nodes: No enlarged mediastinal, hilar, or axillary lymph nodes. Thyroid gland, trachea, and esophagus demonstrate no significant findings.  Lungs/Pleura: Scattered ground-glass airspace opacities throughout the right lung base (series 4, image 69). No pleural effusion or pneumothorax. Musculoskeletal: No chest wall mass or suspicious osseous lesions identified. CT ABDOMEN PELVIS FINDINGS Hepatobiliary: No solid liver abnormality is seen. No gallstones, gallbladder wall thickening, or biliary dilatation. Pancreas: Unremarkable. No pancreatic ductal dilatation or surrounding inflammatory changes. Spleen: Normal in size without significant abnormality. Adrenals/Urinary Tract: Adrenal glands are unremarkable. Kidneys are normal, without renal calculi, solid lesion, or hydronephrosis. Bladder is unremarkable. Stomach/Bowel: Stomach is within normal limits. Appendix appears normal. No evidence of bowel wall thickening, distention, or inflammatory changes. Descending and sigmoid diverticulosis. Large burden of stool throughout the colon and rectum. Vascular/Lymphatic: Aortic atherosclerosis. No enlarged abdominal or pelvic lymph nodes. Reproductive: Prostatomegaly. Other: Small, fat containing midline epigastric hernia (series 3, image 83). No ascites. Musculoskeletal: No acute osseous findings. CT THORACIC AND LUMBAR SPINE FINDINGS Alignment: Gentle dextroscoliosis of the thoracic spine, with otherwise normal thoracic kyphosis. Normal lumbar lordosis. Vertebral bodies: Intact. No fracture or dislocation. Disc spaces: Mild disc space height loss and osteophytosis throughout the thoracic spine. Mild disc degenerative change L1-L2 with otherwise intact lumbar disc spaces. Mild facet degenerative change of the lower lumbar levels. Paraspinous soft tissues: Unremarkable. IMPRESSION: 1. Scattered ground-glass airspace opacities throughout the right lung base, consistent with infection or aspiration. 2. No noncontrast CT evidence of acute traumatic injury to the chest, abdomen, or pelvis. 3. No fracture or dislocation of the thoracic or lumbar spine. 4. Descending  and sigmoid diverticulosis. 5. Large burden of stool throughout the colon and rectum. 6. Prostatomegaly. 7. Coronary artery disease. Aortic Atherosclerosis (ICD10-I70.0). Electronically  Signed   By: Jearld Lesch M.D.   On: 01/04/2023 13:23   CT T-SPINE NO CHARGE  Result Date: 01/04/2023 CLINICAL DATA:  Fall EXAM: CT CHEST, ABDOMEN, AND PELVIS WITHOUT CONTRAST CT THORACIC AND LUMBAR SPINE WITHOUT CONTRAST TECHNIQUE: Multidetector CT imaging of the chest, abdomen and pelvis was performed following the standard protocol without administration of intravenous contrast. Multidetector CT imaging of the thoracic and lumbar spine was performed following the standard protocol without administration of intravenous contrast. RADIATION DOSE REDUCTION: This exam was performed according to the departmental dose-optimization program which includes automated exposure control, adjustment of the mA and/or kV according to patient size and/or use of iterative reconstruction technique. COMPARISON:  None Available. FINDINGS: CT CHEST FINDINGS Cardiovascular: Aortic atherosclerosis. Normal heart size. Left coronary artery calcifications. No pericardial effusion. Mediastinum/Nodes: No enlarged mediastinal, hilar, or axillary lymph nodes. Thyroid gland, trachea, and esophagus demonstrate no significant findings. Lungs/Pleura: Scattered ground-glass airspace opacities throughout the right lung base (series 4, image 69). No pleural effusion or pneumothorax. Musculoskeletal: No chest wall mass or suspicious osseous lesions identified. CT ABDOMEN PELVIS FINDINGS Hepatobiliary: No solid liver abnormality is seen. No gallstones, gallbladder wall thickening, or biliary dilatation. Pancreas: Unremarkable. No pancreatic ductal dilatation or surrounding inflammatory changes. Spleen: Normal in size without significant abnormality. Adrenals/Urinary Tract: Adrenal glands are unremarkable. Kidneys are normal, without renal calculi, solid lesion, or  hydronephrosis. Bladder is unremarkable. Stomach/Bowel: Stomach is within normal limits. Appendix appears normal. No evidence of bowel wall thickening, distention, or inflammatory changes. Descending and sigmoid diverticulosis. Large burden of stool throughout the colon and rectum. Vascular/Lymphatic: Aortic atherosclerosis. No enlarged abdominal or pelvic lymph nodes. Reproductive: Prostatomegaly. Other: Small, fat containing midline epigastric hernia (series 3, image 83). No ascites. Musculoskeletal: No acute osseous findings. CT THORACIC AND LUMBAR SPINE FINDINGS Alignment: Gentle dextroscoliosis of the thoracic spine, with otherwise normal thoracic kyphosis. Normal lumbar lordosis. Vertebral bodies: Intact. No fracture or dislocation. Disc spaces: Mild disc space height loss and osteophytosis throughout the thoracic spine. Mild disc degenerative change L1-L2 with otherwise intact lumbar disc spaces. Mild facet degenerative change of the lower lumbar levels. Paraspinous soft tissues: Unremarkable. IMPRESSION: 1. Scattered ground-glass airspace opacities throughout the right lung base, consistent with infection or aspiration. 2. No noncontrast CT evidence of acute traumatic injury to the chest, abdomen, or pelvis. 3. No fracture or dislocation of the thoracic or lumbar spine. 4. Descending and sigmoid diverticulosis. 5. Large burden of stool throughout the colon and rectum. 6. Prostatomegaly. 7. Coronary artery disease. Aortic Atherosclerosis (ICD10-I70.0). Electronically Signed   By: Jearld Lesch M.D.   On: 01/04/2023 13:23   CT L-SPINE NO CHARGE  Result Date: 01/04/2023 CLINICAL DATA:  Fall EXAM: CT CHEST, ABDOMEN, AND PELVIS WITHOUT CONTRAST CT THORACIC AND LUMBAR SPINE WITHOUT CONTRAST TECHNIQUE: Multidetector CT imaging of the chest, abdomen and pelvis was performed following the standard protocol without administration of intravenous contrast. Multidetector CT imaging of the thoracic and lumbar spine  was performed following the standard protocol without administration of intravenous contrast. RADIATION DOSE REDUCTION: This exam was performed according to the departmental dose-optimization program which includes automated exposure control, adjustment of the mA and/or kV according to patient size and/or use of iterative reconstruction technique. COMPARISON:  None Available. FINDINGS: CT CHEST FINDINGS Cardiovascular: Aortic atherosclerosis. Normal heart size. Left coronary artery calcifications. No pericardial effusion. Mediastinum/Nodes: No enlarged mediastinal, hilar, or axillary lymph nodes. Thyroid gland, trachea, and esophagus demonstrate no significant findings. Lungs/Pleura: Scattered ground-glass airspace opacities throughout the right  lung base (series 4, image 69). No pleural effusion or pneumothorax. Musculoskeletal: No chest wall mass or suspicious osseous lesions identified. CT ABDOMEN PELVIS FINDINGS Hepatobiliary: No solid liver abnormality is seen. No gallstones, gallbladder wall thickening, or biliary dilatation. Pancreas: Unremarkable. No pancreatic ductal dilatation or surrounding inflammatory changes. Spleen: Normal in size without significant abnormality. Adrenals/Urinary Tract: Adrenal glands are unremarkable. Kidneys are normal, without renal calculi, solid lesion, or hydronephrosis. Bladder is unremarkable. Stomach/Bowel: Stomach is within normal limits. Appendix appears normal. No evidence of bowel wall thickening, distention, or inflammatory changes. Descending and sigmoid diverticulosis. Large burden of stool throughout the colon and rectum. Vascular/Lymphatic: Aortic atherosclerosis. No enlarged abdominal or pelvic lymph nodes. Reproductive: Prostatomegaly. Other: Small, fat containing midline epigastric hernia (series 3, image 83). No ascites. Musculoskeletal: No acute osseous findings. CT THORACIC AND LUMBAR SPINE FINDINGS Alignment: Gentle dextroscoliosis of the thoracic spine, with  otherwise normal thoracic kyphosis. Normal lumbar lordosis. Vertebral bodies: Intact. No fracture or dislocation. Disc spaces: Mild disc space height loss and osteophytosis throughout the thoracic spine. Mild disc degenerative change L1-L2 with otherwise intact lumbar disc spaces. Mild facet degenerative change of the lower lumbar levels. Paraspinous soft tissues: Unremarkable. IMPRESSION: 1. Scattered ground-glass airspace opacities throughout the right lung base, consistent with infection or aspiration. 2. No noncontrast CT evidence of acute traumatic injury to the chest, abdomen, or pelvis. 3. No fracture or dislocation of the thoracic or lumbar spine. 4. Descending and sigmoid diverticulosis. 5. Large burden of stool throughout the colon and rectum. 6. Prostatomegaly. 7. Coronary artery disease. Aortic Atherosclerosis (ICD10-I70.0). Electronically Signed   By: Jearld Lesch M.D.   On: 01/04/2023 13:23   DG Chest Port 1 View  Result Date: 01/04/2023 CLINICAL DATA:  Level 2 trauma. Patient fell through door way. Complains of shortness of breath, fever and COVID exposure. EXAM: PORTABLE CHEST 1 VIEW COMPARISON:  02/05/20 FINDINGS: Heart size and mediastinal contours are normal. Aortic atherosclerosis. Lungs appear hyperinflated with by lateral upper lobe scarring. No superimposed pleural effusion, interstitial edema or airspace consolidation. Curvature of the thoracic spine is convex towards the right. No acute osseous findings. IMPRESSION: 1. No acute cardiopulmonary disease. 2.  Aortic Atherosclerosis (ICD10-I70.0). Electronically Signed   By: Signa Kell M.D.   On: 01/04/2023 12:18   DG Pelvis Portable  Result Date: 01/04/2023 CLINICAL DATA:  Fall. EXAM: PORTABLE PELVIS 1-2 VIEWS COMPARISON:  None Available. FINDINGS: There is no evidence of pelvic fracture or diastasis. No pelvic bone lesions are seen. IMPRESSION: Negative. Electronically Signed   By: Obie Dredge M.D.   On: 01/04/2023 12:13     Pending Labs Unresulted Labs (From admission, onward)     Start     Ordered   01/05/23 0500  Comprehensive metabolic panel  Tomorrow morning,   R        01/04/23 1438   01/05/23 0500  CBC  Tomorrow morning,   R        01/04/23 1438   01/04/23 1439  Procalcitonin  Daily,   R     References:    Procalcitonin Lower Respiratory Tract Infection AND Sepsis Procalcitonin Algorithm   01/04/23 1438   01/04/23 1118  Blood Culture (routine x 2)  (Undifferentiated presentation (screening labs and basic nursing orders))  BLOOD CULTURE X 2,   STAT      01/04/23 1117            Vitals/Pain Today's Vitals   01/04/23 1811 01/04/23 1956 01/04/23 2011 01/04/23 2012  BP:    113/67  Pulse:    (!) 48  Resp:    18  Temp:  98.4 F (36.9 C)  98.2 F (36.8 C)  TempSrc:    Oral  SpO2:    97%  PainSc: 0-No pain  0-No pain     Isolation Precautions Airborne and Contact precautions  Medications Medications  vancomycin (VANCOREADY) IVPB 1250 mg/250 mL (1,250 mg Intravenous New Bag/Given 01/04/23 1906)  sodium chloride flush (NS) 0.9 % injection 3 mL (3 mLs Intravenous Not Given 01/04/23 1740)  acetaminophen (TYLENOL) tablet 650 mg (has no administration in time range)    Or  acetaminophen (TYLENOL) suppository 650 mg (has no administration in time range)  polyethylene glycol (MIRALAX / GLYCOLAX) packet 17 g (has no administration in time range)  lactated ringers infusion ( Intravenous New Bag/Given 01/04/23 1907)  dexamethasone (DECADRON) injection 6 mg (6 mg Intravenous Given 01/04/23 1804)  remdesivir 200 mg in sodium chloride 0.9% 250 mL IVPB (has no administration in time range)    Followed by  remdesivir 100 mg in sodium chloride 0.9 % 100 mL IVPB (has no administration in time range)  levothyroxine (SYNTHROID) tablet 50 mcg (has no administration in time range)  rosuvastatin (CRESTOR) tablet 10 mg (has no administration in time range)  thiamine (VITAMIN B1) tablet 50 mg (has no  administration in time range)  cefTRIAXone (ROCEPHIN) 2 g in sodium chloride 0.9 % 100 mL IVPB (has no administration in time range)  azithromycin (ZITHROMAX) 500 mg in sodium chloride 0.9 % 250 mL IVPB (has no administration in time range)  lactated ringers bolus 500 mL (0 mLs Intravenous Stopped 01/04/23 1301)  ceFEPIme (MAXIPIME) 2 g in sodium chloride 0.9 % 100 mL IVPB (0 g Intravenous Stopped 01/04/23 1302)  metroNIDAZOLE (FLAGYL) IVPB 500 mg (0 mg Intravenous Stopped 01/04/23 1437)  acetaminophen (TYLENOL) tablet 1,000 mg (1,000 mg Oral Given 01/04/23 1327)  lactated ringers bolus 1,000 mL (0 mLs Intravenous Stopped 01/04/23 1815)    Mobility walks     Focused Assessments Pulmonary Assessment Handoff:  Lung sounds:   O2 Device: Nasal Cannula O2 Flow Rate (L/min): 4 L/min    R Recommendations: See Admitting Provider Note  Report given to:   Additional Notes: Pt currently on 4L O2, vanc infusing with LR, A&OX4, at baseline ambulates without assist

## 2023-01-04 NOTE — ED Triage Notes (Signed)
Pt denies pain. Pt's only c/o nausea. Pt in c-collar upon arrival to ED.

## 2023-01-04 NOTE — ED Triage Notes (Signed)
Pt to ED via EMS from [facility] at [facility]. Pt had mechanical fall at facility, pt stepped backwards and fell into closet. Unsure if pt hit his head, unsure of LOC. Pt is on warfarin. Pt has hx of a fib. Pt aspirated while laying on floor on his own vomit. Airway patent. Pt has also been lethargic at home x2 days with fevers. Pt reports being around Covid + people. Pt had brief episode of bradycardia with HR of 30 in a fib with EMS for ~5 mins. Pt was originally 88% on RA with EMS. EMS placed pt on NRB. Pt came up to 100% on NRB with EMS. Pt has hx of dementia baseline.    EMS: 88% RA 65 HR 202 CBG 30 RR 18 R Wrist

## 2023-01-04 NOTE — ED Notes (Signed)
Patient transported to CT 

## 2023-01-04 NOTE — Progress Notes (Signed)
Orthopedic Tech Progress Note Patient Details:  Jason Barton [birth year] [id number] Level 2 Trauma  Patient ID: Jason Barton, male   DOB: [birth year], 89 y.o.   MRN: [id number]  Jason Barton 01/04/2023, 11:15 AM

## 2023-01-04 NOTE — ED Notes (Signed)
C collar removed by Dr Dixon  

## 2023-01-04 NOTE — Progress Notes (Signed)
ED Pharmacy Antibiotic Sign Off An antibiotic consult was received from an ED provider for sepsis per pharmacy dosing for vancomycin and cefepime. A chart review was completed to assess appropriateness.   The following one time order(s) were placed:  Vancomycin 1250 mg IV x 1 Cefepime 2g IV x 1  Further antibiotic and/or antibiotic pharmacy consults should be ordered by the admitting provider if indicated.   Thank you for allowing pharmacy to be a part of this patient's care.   Daylene Posey, [facility]  Clinical Pharmacist 01/04/23 11:48 AM

## 2023-01-04 NOTE — ED Notes (Signed)
Pt returned from CT, placed back on bedside cardiac monitor.

## 2023-01-05 DIAGNOSIS — I77810 Thoracic aortic ectasia: Secondary | ICD-10-CM | POA: Diagnosis present

## 2023-01-05 DIAGNOSIS — Z85828 Personal history of other malignant neoplasm of skin: Secondary | ICD-10-CM | POA: Diagnosis not present

## 2023-01-05 DIAGNOSIS — N320 Bladder-neck obstruction: Secondary | ICD-10-CM | POA: Diagnosis present

## 2023-01-05 DIAGNOSIS — Z91010 Allergy to peanuts: Secondary | ICD-10-CM | POA: Diagnosis not present

## 2023-01-05 DIAGNOSIS — I4891 Unspecified atrial fibrillation: Secondary | ICD-10-CM | POA: Diagnosis present

## 2023-01-05 DIAGNOSIS — E785 Hyperlipidemia, unspecified: Secondary | ICD-10-CM | POA: Diagnosis present

## 2023-01-05 DIAGNOSIS — I959 Hypotension, unspecified: Secondary | ICD-10-CM | POA: Diagnosis present

## 2023-01-05 DIAGNOSIS — Z880 Allergy status to penicillin: Secondary | ICD-10-CM | POA: Diagnosis not present

## 2023-01-05 DIAGNOSIS — E039 Hypothyroidism, unspecified: Secondary | ICD-10-CM | POA: Diagnosis present

## 2023-01-05 DIAGNOSIS — J690 Pneumonitis due to inhalation of food and vomit: Secondary | ICD-10-CM | POA: Diagnosis present

## 2023-01-05 DIAGNOSIS — J1282 Pneumonia due to coronavirus disease 2019: Secondary | ICD-10-CM | POA: Diagnosis present

## 2023-01-05 DIAGNOSIS — J440 Chronic obstructive pulmonary disease with acute lower respiratory infection: Secondary | ICD-10-CM | POA: Diagnosis present

## 2023-01-05 DIAGNOSIS — S0083XA Contusion of other part of head, initial encounter: Secondary | ICD-10-CM | POA: Diagnosis present

## 2023-01-05 DIAGNOSIS — W1830XA Fall on same level, unspecified, initial encounter: Secondary | ICD-10-CM | POA: Diagnosis present

## 2023-01-05 DIAGNOSIS — N400 Enlarged prostate without lower urinary tract symptoms: Secondary | ICD-10-CM | POA: Diagnosis present

## 2023-01-05 DIAGNOSIS — Z8673 Personal history of transient ischemic attack (TIA), and cerebral infarction without residual deficits: Secondary | ICD-10-CM | POA: Diagnosis not present

## 2023-01-05 DIAGNOSIS — U071 COVID-19: Secondary | ICD-10-CM | POA: Diagnosis present

## 2023-01-05 DIAGNOSIS — J9601 Acute respiratory failure with hypoxia: Secondary | ICD-10-CM | POA: Diagnosis present

## 2023-01-05 DIAGNOSIS — I2510 Atherosclerotic heart disease of native coronary artery without angina pectoris: Secondary | ICD-10-CM | POA: Diagnosis present

## 2023-01-05 DIAGNOSIS — R001 Bradycardia, unspecified: Secondary | ICD-10-CM

## 2023-01-05 DIAGNOSIS — D696 Thrombocytopenia, unspecified: Secondary | ICD-10-CM | POA: Diagnosis present

## 2023-01-05 DIAGNOSIS — J159 Unspecified bacterial pneumonia: Secondary | ICD-10-CM | POA: Diagnosis present

## 2023-01-05 DIAGNOSIS — K5900 Constipation, unspecified: Secondary | ICD-10-CM | POA: Diagnosis present

## 2023-01-05 DIAGNOSIS — Z79890 Hormone replacement therapy (postmenopausal): Secondary | ICD-10-CM | POA: Diagnosis not present

## 2023-01-05 DIAGNOSIS — F0394 Unspecified dementia, unspecified severity, with anxiety: Secondary | ICD-10-CM | POA: Diagnosis present

## 2023-01-05 LAB — COMPREHENSIVE METABOLIC PANEL
ALT: 17 U/L (ref 0–44)
AST: 25 U/L (ref 15–41)
Albumin: 2.7 g/dL — ABNORMAL LOW (ref 3.5–5.0)
Alkaline Phosphatase: 49 U/L (ref 38–126)
Anion gap: 7 (ref 5–15)
BUN: 14 mg/dL (ref 8–23)
CO2: 24 mmol/L (ref 22–32)
Calcium: 7.6 mg/dL — ABNORMAL LOW (ref 8.9–10.3)
Chloride: 102 mmol/L (ref 98–111)
Creatinine, Ser: 0.93 mg/dL (ref 0.61–1.24)
GFR, Estimated: >60 mL/min (ref >60)
Glucose, Bld: 123 mg/dL — ABNORMAL HIGH (ref 70–99)
Potassium: 3.3 mmol/L — ABNORMAL LOW (ref 3.5–5.1)
Sodium: 133 mmol/L — ABNORMAL LOW (ref 135–145)
Total Bilirubin: 0.4 mg/dL (ref 0.3–1.2)
Total Protein: 4.9 g/dL — ABNORMAL LOW (ref 6.5–8.1)

## 2023-01-05 LAB — CBC
HCT: 39.9 % (ref 39.0–52.0)
Hemoglobin: 13.3 g/dL (ref 13.0–17.0)
MCH: 31.3 pg (ref 26.0–34.0)
MCHC: 33.3 g/dL (ref 30.0–36.0)
MCV: 93.9 fL (ref 80.0–100.0)
Platelets: 76 10*3/uL — ABNORMAL LOW (ref 150–400)
RBC: 4.25 MIL/uL (ref 4.22–5.81)
RDW: 14.4 % (ref 11.5–15.5)
WBC: 7.5 10*3/uL (ref 4.0–10.5)
nRBC: 0 % (ref 0.0–0.2)

## 2023-01-05 LAB — BRAIN NATRIURETIC PEPTIDE: B Natriuretic Peptide: 797 pg/mL — ABNORMAL HIGH (ref 0.0–100.0)

## 2023-01-05 LAB — PROCALCITONIN: Procalcitonin: 0.16 ng/mL

## 2023-01-05 LAB — TSH: TSH: 1.273 u[IU]/mL (ref 0.350–4.500)

## 2023-01-05 MED ORDER — ALFUZOSIN HCL ER 10 MG PO TB24
10.0000 mg | ORAL_TABLET | Freq: Every day | ORAL | Status: DC
  Administered 2023-01-06 – 2023-01-08 (×3): 10 mg via ORAL
  Filled 2023-01-05 (×3): qty 1

## 2023-01-05 MED ORDER — POTASSIUM CHLORIDE CRYS ER 20 MEQ PO TBCR
40.0000 meq | EXTENDED_RELEASE_TABLET | Freq: Once | ORAL | Status: AC
  Administered 2023-01-05: 40 meq via ORAL
  Filled 2023-01-05: qty 2

## 2023-01-05 MED ORDER — GUAIFENESIN-DM 100-10 MG/5ML PO SYRP: 5.0000 mL | ORAL_SOLUTION | ORAL | Status: DC | PRN

## 2023-01-05 NOTE — Consult Note (Addendum)
Cardiology Consultation   Patient ID: [name] MRN: [id number]; DOB: January 16, 1926  Admit date: 01/04/2023 Date of Consult: 01/05/2023  PCP:  Corwin Levins, MD   [facility] Providers Cardiologist:  Olga Millers, MD   {  Patient Profile:   Jason Barton is a 89 y.o. male with a history of bradycardia, hypothyroidism, possible TIA in 09/2020, COPD, chronic thrombocytopenia, bladder outlet obstruction, and dementia who is being seen 01/05/2023 for the evaluation of bradycardia at the request of Dr. Lowell Guitar.  History of Present Illness:   [name] is a 89 year old male with the above history who is followed by Dr. Jens Som. He has known bradycardia. Monitor in 10/2020 after a possible TIA showed underlying sinus bradycardia with average heart rate of 55 bpm and 9% ventricular ectopy but no concerning arrhythmias. Echo at that time showed LVEF of 60-65% with normal wall motion and grade 1 diastolic dysfunction, normal RV, and mild dilatation of the aortic root and ascending aorta measuring 41 mm and 42 mm respectively. Patient was last seen by Dr. Jens Som in 09/2022 at which time he was doing well from a cardiac standpoint.  Patient presented to the ED on 01/04/2023 after a mechanical fall. Upon EMS arrival, patient was noted to be hypoxic with O2 sat of 88% and was placed on NRB. He also reportedly had a brief episode of bradycardia with heart rates in the 30 en route to the ED. In the ED, EKG showed sinus rhythm normal sinus rhythm with PVCs/ PACs. High-sensitivity troponin negative x2. BNP elevated at 465. Chest x-ray showed no acute findings. Pelvic x-ray was negative. Head/ neck CT showed no acute findings. Chest/ abdominal/ pelvic CT showed scattered ground-glass airspace opacities throughout the right lung base consistent with infection or aspiration. WBC 3.5, Hgb 13.4, Plts 71. Na 136, K 3.8, Glucose 131, BUN 18, Cr 1.0. LFTs negative. Respiratory panel positive for  COVID. Blood culture negative to date. Patient was admitted with acute hypoxic respiratory failure secondary to aspiration vs pneumonia and COVID 19. Cardiology was consulted for bradycardia.  At the time of this evaluation, patient is resting comfortably in no acute distress. He has been doing well from a cardiac standpoint. He described 2 mechanical falls over the last 2 days - both episodes occurred while he was putting on/ taking off a pair of [place] shorts. After the 2nd fall yesterday, he states he felt very weak and was unable to get himself off the floor. He denies any palpitations, lightheadedness, dizziness, or syncope. No chest pain, shortness of breath, orthopnea, PND, or edema. Per triage note in the ED, he has also been feeling lethargic the past 2 days with mild fevers. He lives at [facility] at [facility] and has apparently been around COVID + residents. Triage note also mentions that patient aspirated while laying in the floor in his own vomit after the second fall. He denies any cough or nasal congestion. No abnormal bleeding in urine or stools.   Reviewed telemetry which shows sinus rhythm with rates mostly in the 50s but occasionally in the 40s. However, hear rate will increase with activity. His heart rates increased to the 70s to 80s just moving around in bed. He does have frequent PACs/ PVCs with some short compensatory pauses. But no significant bradycardia or high grade AV block.  Past Medical History:  Diagnosis Date   COPD (chronic obstructive pulmonary disease) (HCC) 05/21/2011   Diverticulosis of colon 05/21/2011  Hypothyroidism    Impaired glucose tolerance 06/08/2012   Increased prostate specific antigen (PSA) velocity 05/24/2011   Nonmelanoma skin cancer 05/21/2011   S/P appendectomy 05/21/2011   S/P inguinal hernia repair 05/21/2011   S/p small bowel obstruction 05/21/2011   Thoracic scoliosis 05/21/2011    Past Surgical History:  Procedure Laterality Date    APPENDECTOMY  1939   COLON RESECTION     2009 for a "kink in colon" adhesions after appendectomy   SHOULDER SURGERY     dr sypher; rotater cuff   STRABISMUS SURGERY Right 11/21/2014   Procedure: REPAIR STRABISMUS RIGHT EYE ;  Surgeon: Verne Carrow, MD;  Location: [facility];  Service: Ophthalmology;  Laterality: Right;   TONSILLECTOMY  1942       Inpatient Medications: Scheduled Meds:  [START ON 01/06/2023] alfuzosin  10 mg Oral Q breakfast   dexamethasone (DECADRON) injection  6 mg Intravenous Q24H   levothyroxine  50 mcg Oral QAC breakfast   rosuvastatin  10 mg Oral Daily   sodium chloride flush  3 mL Intravenous Q12H   thiamine  50 mg Oral Daily   Continuous Infusions:  azithromycin     cefTRIAXone (ROCEPHIN)  IV Stopped (01/05/23 0101)   remdesivir 100 mg in sodium chloride 0.9 % 100 mL IVPB 100 mg (01/05/23 1038)   PRN Meds: acetaminophen **OR** acetaminophen, guaiFENesin-dextromethorphan, polyethylene glycol  Allergies:    Allergies  Allergen Reactions   Augmentin [Amoxicillin-Pot Clavulanate] Rash    Itching rash to all torso 2 days after start augmentin 12/2017   Peanut-Containing Drug Products Rash    Social History:   Social History   Socioeconomic History   Marital status: Married    Spouse name: June   Number of children: 4   Years of education: MBA   Highest education level: Not on file  Occupational History   Occupation: retired  Tobacco Use   Smoking status: Never   Smokeless tobacco: Never  Vaping Use   Vaping status: Never Used  Substance and Sexual Activity   Alcohol use: Yes    Comment: Rare   Drug use: No   Sexual activity: Never  Other Topics Concern   Not on file  Social History Narrative   Lives at home with wife   Right Handed   Drinks 1-2 cups caffeine daily   Social Determinants of Health   Financial Resource Strain: Low Risk  (04/19/2022)   Overall Financial Resource Strain (CARDIA)    Difficulty of Paying  Living Expenses: Not hard at all  Food Insecurity: No Food Insecurity (04/19/2022)   Hunger Vital Sign    Worried About Running Out of Food in the Last Year: Never true    Ran Out of Food in the Last Year: Never true  Transportation Needs: No Transportation Needs (04/19/2022)   PRAPARE - Administrator, Civil Service (Medical): No    Lack of Transportation (Non-Medical): No  Physical Activity: Insufficiently Active (04/19/2022)   Exercise Vital Sign    Days of Exercise per Week: 3 days    Minutes of Exercise per Session: 30 min  Stress: No Stress Concern Present (04/19/2022)   [employer] of Occupational Health - Occupational Stress Questionnaire    Feeling of Stress : Only a little  Social Connections: Socially Integrated (04/19/2022)   Social Connection and Isolation Panel [NHANES]    Frequency of Communication with Friends and Family: More than three times a week    Frequency of Social  Gatherings with Friends and Family: More than three times a week    Attends Religious Services: 1 to 4 times per year    Active Member of [employer] or Organizations: Yes    Attends Banker Meetings: 1 to 4 times per year    Marital Status: Married  Catering manager Violence: Not At Risk (04/19/2022)   Humiliation, Afraid, Rape, and Kick questionnaire    Fear of Current or Ex-Partner: No    Emotionally Abused: No    Physically Abused: No    Sexually Abused: No    Family History:   Family History  Problem Relation Age of Onset   Arthritis Other    Sudden death Other    Colon cancer Neg Hx    Esophageal cancer Neg Hx    Rectal cancer Neg Hx      ROS:  Please see the history of present illness.  All other ROS reviewed and negative.     Physical Exam/Data:   Vitals:   01/05/23 0356 01/05/23 0541 01/05/23 1000 01/05/23 1025  BP: 117/64 110/84  105/69  Pulse: (!) 45 (!) 44 65 60  Resp: 19 18 18 18   Temp: 98.4 F (36.9 C) 98 F (36.7 C)  98.8 F (37.1 C)   TempSrc: Oral Oral  Oral  SpO2: 97% 96% 95% 97%  Weight:      Height:        Intake/Output Summary (Last 24 hours) at 01/05/2023 1737 Last data filed at 01/05/2023 0747 Gross per 24 hour  Intake 1340 ml  Output 1400 ml  Net -60 ml      01/04/2023   11:10 PM 10/14/2022   11:02 AM 07/15/2022   11:23 AM  Last 3 Weights  Weight (lbs) 131 lb 2.8 oz 129 lb 127 lb  Weight (kg) 59.5 kg 58.514 kg 57.607 kg     Body mass index is 20.54 kg/m.  General: 89 y.o. thin Caucasian male resting comfortably in no acute distress. HEENT: Normocephalic and atraumatic. Sclera clear.  Neck: Supple. No carotid bruits. No JVD. Heart: RRR. Distinct S1 and S2. No murmurs, gallops, or rubs.  Lungs: No increased work of breathing. Clear to ausculation bilaterally. No wheezes, rhonchi, or rales.  Abdomen: Soft, non-distended, and non-tender to palpation.  Extremities: No lower extremity edema. Skin: Warm and dry. Neuro: Alert and oriented x3. No focal deficits. Psych: Normal affect. Responds appropriately.   EKG:  The EKG was personally reviewed and demonstrates:  Sinus rhythm normal sinus rhythm with PVCs/ PACs. Telemetry:  Telemetry was personally reviewed and demonstrates:  Sinus rhythm with rates mostly in the 50s but occasionally in the 40s. However, hear rate will increase with activity. His heart rates increased to the 70s to 80s just moving around in bed. He does have frequent PACs/ PVCs with some short compensatory pauses. But no significant bradycardia or high grade AV block.  Relevant CV Studies:  Echocardiogram 10/20/2020: Impressions: 1. Left ventricular ejection fraction, by estimation, is 60 to 65%. The  left ventricle has normal function. The left ventricle has no regional  wall motion abnormalities. Left ventricular diastolic parameters are  consistent with Grade I diastolic  dysfunction (impaired relaxation).   2. Right ventricular systolic function is normal. The right ventricular   size is normal. There is normal pulmonary artery systolic pressure. The  estimated right ventricular systolic pressure is 18.7 mmHg.   3. The mitral valve is grossly normal. Trivial mitral valve  regurgitation. No evidence of mitral  stenosis.   4. The aortic valve is tricuspid. There is mild calcification of the  aortic valve. Aortic valve regurgitation is mild. Mild aortic valve  sclerosis is present, with no evidence of aortic valve stenosis.   5. Aortic dilatation noted. There is mild dilatation of the aortic root,  measuring 41 mm. There is mild dilatation of the aortic root and of the  ascending aorta, measuring 42 mm.   6. The inferior vena cava is normal in size with greater than 50%  respiratory variability, suggesting right atrial pressure of 3 mmHg.  _______________  Monitor 10/2020: Predominant rhythm was sinus rhythm 9% ventricular ectopy No symptoms recorded No prolonged arrhythmias noted   Laboratory Data:  High Sensitivity Troponin:   Recent Labs  Lab 01/04/23 1130 01/04/23 1318  TROPONINIHS 10 13     Chemistry Recent Labs  Lab 01/04/23 1130 01/04/23 1139 01/05/23 0714  NA 136 137 133*  K 3.8 3.7 3.3*  CL 100 100 102  CO2 23  --  24  GLUCOSE 131* 127* 123*  BUN 18 20 14   CREATININE 1.00 0.90 0.93  CALCIUM 8.5*  --  7.6*  GFRNONAA >60  --  >60  ANIONGAP 13  --  7    Recent Labs  Lab 01/04/23 1130 01/05/23 0714  PROT 5.9* 4.9*  ALBUMIN 3.6 2.7*  AST 27 25  ALT 19 17  ALKPHOS 65 49  BILITOT 0.6 0.4   Lipids No results for input(s): "CHOL", "TRIG", "HDL", "LABVLDL", "LDLCALC", "CHOLHDL" in the last 168 hours.  Hematology Recent Labs  Lab 01/04/23 1130 01/04/23 1139 01/05/23 0714  WBC 3.5*  --  7.5  RBC 4.43  --  4.25  HGB 13.4 13.3 13.3  HCT 40.7 39.0 39.9  MCV 91.9  --  93.9  MCH 30.2  --  31.3  MCHC 32.9  --  33.3  RDW 14.3  --  14.4  PLT 71*  --  76*   Thyroid  Recent Labs  Lab 01/05/23 0708  TSH 1.273    BNP Recent Labs   Lab 01/04/23 1130 01/05/23 0708  BNP 465.7* 797.0*    DDimer No results for input(s): "DDIMER" in the last 168 hours.   Radiology/Studies:  CT HEAD WO CONTRAST  Result Date: 01/04/2023 CLINICAL DATA:  Fall EXAM: CT HEAD WITHOUT CONTRAST CT CERVICAL SPINE WITHOUT CONTRAST TECHNIQUE: Multidetector CT imaging of the head and cervical spine was performed following the standard protocol without intravenous contrast. Multiplanar CT image reconstructions of the cervical spine were also generated. RADIATION DOSE REDUCTION: This exam was performed according to the departmental dose-optimization program which includes automated exposure control, adjustment of the mA and/or kV according to patient size and/or use of iterative reconstruction technique. COMPARISON:  06/23/2021 FINDINGS: CT HEAD FINDINGS Brain: No evidence of acute infarction, hemorrhage, hydrocephalus, extra-axial collection or mass lesion/mass effect. Periventricular and deep white matter hypodensity. Vascular: No hyperdense vessel or unexpected calcification. Skull: Normal. Negative for fracture or focal lesion. Sinuses/Orbits: No acute finding. Other: Soft tissue contusion of the right forehead (series 3, image 22). CT CERVICAL SPINE FINDINGS Alignment: Normal. Skull base and vertebrae: No acute fracture. No primary bone lesion or focal pathologic process. Soft tissues and spinal canal: No prevertebral fluid or swelling. No visible canal hematoma. Disc levels: Mild-to-moderate disc space height loss and osteophytosis, worst at C3-C4. Upper chest: Negative. Other: None. IMPRESSION: 1. No acute intracranial pathology. Small-vessel white matter disease. 2. Soft tissue contusion of the right forehead. 3. No  fracture or static subluxation of the cervical spine. 4. Mild-to-moderate cervical disc degenerative disease. Electronically Signed   By: Jearld Lesch M.D.   On: 01/04/2023 13:29   CT CERVICAL SPINE WO CONTRAST  Result Date:  01/04/2023 CLINICAL DATA:  Fall EXAM: CT HEAD WITHOUT CONTRAST CT CERVICAL SPINE WITHOUT CONTRAST TECHNIQUE: Multidetector CT imaging of the head and cervical spine was performed following the standard protocol without intravenous contrast. Multiplanar CT image reconstructions of the cervical spine were also generated. RADIATION DOSE REDUCTION: This exam was performed according to the departmental dose-optimization program which includes automated exposure control, adjustment of the mA and/or kV according to patient size and/or use of iterative reconstruction technique. COMPARISON:  06/23/2021 FINDINGS: CT HEAD FINDINGS Brain: No evidence of acute infarction, hemorrhage, hydrocephalus, extra-axial collection or mass lesion/mass effect. Periventricular and deep white matter hypodensity. Vascular: No hyperdense vessel or unexpected calcification. Skull: Normal. Negative for fracture or focal lesion. Sinuses/Orbits: No acute finding. Other: Soft tissue contusion of the right forehead (series 3, image 22). CT CERVICAL SPINE FINDINGS Alignment: Normal. Skull base and vertebrae: No acute fracture. No primary bone lesion or focal pathologic process. Soft tissues and spinal canal: No prevertebral fluid or swelling. No visible canal hematoma. Disc levels: Mild-to-moderate disc space height loss and osteophytosis, worst at C3-C4. Upper chest: Negative. Other: None. IMPRESSION: 1. No acute intracranial pathology. Small-vessel white matter disease. 2. Soft tissue contusion of the right forehead. 3. No fracture or static subluxation of the cervical spine. 4. Mild-to-moderate cervical disc degenerative disease. Electronically Signed   By: Jearld Lesch M.D.   On: 01/04/2023 13:29   CT CHEST ABDOMEN PELVIS WO CONTRAST  Result Date: 01/04/2023 CLINICAL DATA:  Fall EXAM: CT CHEST, ABDOMEN, AND PELVIS WITHOUT CONTRAST CT THORACIC AND LUMBAR SPINE WITHOUT CONTRAST TECHNIQUE: Multidetector CT imaging of the chest, abdomen and  pelvis was performed following the standard protocol without administration of intravenous contrast. Multidetector CT imaging of the thoracic and lumbar spine was performed following the standard protocol without administration of intravenous contrast. RADIATION DOSE REDUCTION: This exam was performed according to the departmental dose-optimization program which includes automated exposure control, adjustment of the mA and/or kV according to patient size and/or use of iterative reconstruction technique. COMPARISON:  None Available. FINDINGS: CT CHEST FINDINGS Cardiovascular: Aortic atherosclerosis. Normal heart size. Left coronary artery calcifications. No pericardial effusion. Mediastinum/Nodes: No enlarged mediastinal, hilar, or axillary lymph nodes. Thyroid gland, trachea, and esophagus demonstrate no significant findings. Lungs/Pleura: Scattered ground-glass airspace opacities throughout the right lung base (series 4, image 69). No pleural effusion or pneumothorax. Musculoskeletal: No chest wall mass or suspicious osseous lesions identified. CT ABDOMEN PELVIS FINDINGS Hepatobiliary: No solid liver abnormality is seen. No gallstones, gallbladder wall thickening, or biliary dilatation. Pancreas: Unremarkable. No pancreatic ductal dilatation or surrounding inflammatory changes. Spleen: Normal in size without significant abnormality. Adrenals/Urinary Tract: Adrenal glands are unremarkable. Kidneys are normal, without renal calculi, solid lesion, or hydronephrosis. Bladder is unremarkable. Stomach/Bowel: Stomach is within normal limits. Appendix appears normal. No evidence of bowel wall thickening, distention, or inflammatory changes. Descending and sigmoid diverticulosis. Large burden of stool throughout the colon and rectum. Vascular/Lymphatic: Aortic atherosclerosis. No enlarged abdominal or pelvic lymph nodes. Reproductive: Prostatomegaly. Other: Small, fat containing midline epigastric hernia (series 3, image  83). No ascites. Musculoskeletal: No acute osseous findings. CT THORACIC AND LUMBAR SPINE FINDINGS Alignment: Gentle dextroscoliosis of the thoracic spine, with otherwise normal thoracic kyphosis. Normal lumbar lordosis. Vertebral bodies: Intact. No fracture or dislocation. Disc spaces: Mild  disc space height loss and osteophytosis throughout the thoracic spine. Mild disc degenerative change L1-L2 with otherwise intact lumbar disc spaces. Mild facet degenerative change of the lower lumbar levels. Paraspinous soft tissues: Unremarkable. IMPRESSION: 1. Scattered ground-glass airspace opacities throughout the right lung base, consistent with infection or aspiration. 2. No noncontrast CT evidence of acute traumatic injury to the chest, abdomen, or pelvis. 3. No fracture or dislocation of the thoracic or lumbar spine. 4. Descending and sigmoid diverticulosis. 5. Large burden of stool throughout the colon and rectum. 6. Prostatomegaly. 7. Coronary artery disease. Aortic Atherosclerosis (ICD10-I70.0). Electronically Signed   By: Jearld Lesch M.D.   On: 01/04/2023 13:23   CT T-SPINE NO CHARGE  Result Date: 01/04/2023 CLINICAL DATA:  Fall EXAM: CT CHEST, ABDOMEN, AND PELVIS WITHOUT CONTRAST CT THORACIC AND LUMBAR SPINE WITHOUT CONTRAST TECHNIQUE: Multidetector CT imaging of the chest, abdomen and pelvis was performed following the standard protocol without administration of intravenous contrast. Multidetector CT imaging of the thoracic and lumbar spine was performed following the standard protocol without administration of intravenous contrast. RADIATION DOSE REDUCTION: This exam was performed according to the departmental dose-optimization program which includes automated exposure control, adjustment of the mA and/or kV according to patient size and/or use of iterative reconstruction technique. COMPARISON:  None Available. FINDINGS: CT CHEST FINDINGS Cardiovascular: Aortic atherosclerosis. Normal heart size. Left  coronary artery calcifications. No pericardial effusion. Mediastinum/Nodes: No enlarged mediastinal, hilar, or axillary lymph nodes. Thyroid gland, trachea, and esophagus demonstrate no significant findings. Lungs/Pleura: Scattered ground-glass airspace opacities throughout the right lung base (series 4, image 69). No pleural effusion or pneumothorax. Musculoskeletal: No chest wall mass or suspicious osseous lesions identified. CT ABDOMEN PELVIS FINDINGS Hepatobiliary: No solid liver abnormality is seen. No gallstones, gallbladder wall thickening, or biliary dilatation. Pancreas: Unremarkable. No pancreatic ductal dilatation or surrounding inflammatory changes. Spleen: Normal in size without significant abnormality. Adrenals/Urinary Tract: Adrenal glands are unremarkable. Kidneys are normal, without renal calculi, solid lesion, or hydronephrosis. Bladder is unremarkable. Stomach/Bowel: Stomach is within normal limits. Appendix appears normal. No evidence of bowel wall thickening, distention, or inflammatory changes. Descending and sigmoid diverticulosis. Large burden of stool throughout the colon and rectum. Vascular/Lymphatic: Aortic atherosclerosis. No enlarged abdominal or pelvic lymph nodes. Reproductive: Prostatomegaly. Other: Small, fat containing midline epigastric hernia (series 3, image 83). No ascites. Musculoskeletal: No acute osseous findings. CT THORACIC AND LUMBAR SPINE FINDINGS Alignment: Gentle dextroscoliosis of the thoracic spine, with otherwise normal thoracic kyphosis. Normal lumbar lordosis. Vertebral bodies: Intact. No fracture or dislocation. Disc spaces: Mild disc space height loss and osteophytosis throughout the thoracic spine. Mild disc degenerative change L1-L2 with otherwise intact lumbar disc spaces. Mild facet degenerative change of the lower lumbar levels. Paraspinous soft tissues: Unremarkable. IMPRESSION: 1. Scattered ground-glass airspace opacities throughout the right lung base,  consistent with infection or aspiration. 2. No noncontrast CT evidence of acute traumatic injury to the chest, abdomen, or pelvis. 3. No fracture or dislocation of the thoracic or lumbar spine. 4. Descending and sigmoid diverticulosis. 5. Large burden of stool throughout the colon and rectum. 6. Prostatomegaly. 7. Coronary artery disease. Aortic Atherosclerosis (ICD10-I70.0). Electronically Signed   By: Jearld Lesch M.D.   On: 01/04/2023 13:23   CT L-SPINE NO CHARGE  Result Date: 01/04/2023 CLINICAL DATA:  Fall EXAM: CT CHEST, ABDOMEN, AND PELVIS WITHOUT CONTRAST CT THORACIC AND LUMBAR SPINE WITHOUT CONTRAST TECHNIQUE: Multidetector CT imaging of the chest, abdomen and pelvis was performed following the standard protocol without administration of intravenous contrast.  Multidetector CT imaging of the thoracic and lumbar spine was performed following the standard protocol without administration of intravenous contrast. RADIATION DOSE REDUCTION: This exam was performed according to the departmental dose-optimization program which includes automated exposure control, adjustment of the mA and/or kV according to patient size and/or use of iterative reconstruction technique. COMPARISON:  None Available. FINDINGS: CT CHEST FINDINGS Cardiovascular: Aortic atherosclerosis. Normal heart size. Left coronary artery calcifications. No pericardial effusion. Mediastinum/Nodes: No enlarged mediastinal, hilar, or axillary lymph nodes. Thyroid gland, trachea, and esophagus demonstrate no significant findings. Lungs/Pleura: Scattered ground-glass airspace opacities throughout the right lung base (series 4, image 69). No pleural effusion or pneumothorax. Musculoskeletal: No chest wall mass or suspicious osseous lesions identified. CT ABDOMEN PELVIS FINDINGS Hepatobiliary: No solid liver abnormality is seen. No gallstones, gallbladder wall thickening, or biliary dilatation. Pancreas: Unremarkable. No pancreatic ductal dilatation or  surrounding inflammatory changes. Spleen: Normal in size without significant abnormality. Adrenals/Urinary Tract: Adrenal glands are unremarkable. Kidneys are normal, without renal calculi, solid lesion, or hydronephrosis. Bladder is unremarkable. Stomach/Bowel: Stomach is within normal limits. Appendix appears normal. No evidence of bowel wall thickening, distention, or inflammatory changes. Descending and sigmoid diverticulosis. Large burden of stool throughout the colon and rectum. Vascular/Lymphatic: Aortic atherosclerosis. No enlarged abdominal or pelvic lymph nodes. Reproductive: Prostatomegaly. Other: Small, fat containing midline epigastric hernia (series 3, image 83). No ascites. Musculoskeletal: No acute osseous findings. CT THORACIC AND LUMBAR SPINE FINDINGS Alignment: Gentle dextroscoliosis of the thoracic spine, with otherwise normal thoracic kyphosis. Normal lumbar lordosis. Vertebral bodies: Intact. No fracture or dislocation. Disc spaces: Mild disc space height loss and osteophytosis throughout the thoracic spine. Mild disc degenerative change L1-L2 with otherwise intact lumbar disc spaces. Mild facet degenerative change of the lower lumbar levels. Paraspinous soft tissues: Unremarkable. IMPRESSION: 1. Scattered ground-glass airspace opacities throughout the right lung base, consistent with infection or aspiration. 2. No noncontrast CT evidence of acute traumatic injury to the chest, abdomen, or pelvis. 3. No fracture or dislocation of the thoracic or lumbar spine. 4. Descending and sigmoid diverticulosis. 5. Large burden of stool throughout the colon and rectum. 6. Prostatomegaly. 7. Coronary artery disease. Aortic Atherosclerosis (ICD10-I70.0). Electronically Signed   By: Jearld Lesch M.D.   On: 01/04/2023 13:23   DG Chest Port 1 View  Result Date: 01/04/2023 CLINICAL DATA:  Level 2 trauma. Patient fell through door way. Complains of shortness of breath, fever and COVID exposure. EXAM:  PORTABLE CHEST 1 VIEW COMPARISON:  02/05/20 FINDINGS: Heart size and mediastinal contours are normal. Aortic atherosclerosis. Lungs appear hyperinflated with by lateral upper lobe scarring. No superimposed pleural effusion, interstitial edema or airspace consolidation. Curvature of the thoracic spine is convex towards the right. No acute osseous findings. IMPRESSION: 1. No acute cardiopulmonary disease. 2.  Aortic Atherosclerosis (ICD10-I70.0). Electronically Signed   By: Signa Kell M.D.   On: 01/04/2023 12:18   DG Pelvis Portable  Result Date: 01/04/2023 CLINICAL DATA:  Fall. EXAM: PORTABLE PELVIS 1-2 VIEWS COMPARISON:  None Available. FINDINGS: There is no evidence of pelvic fracture or diastasis. No pelvic bone lesions are seen. IMPRESSION: Negative. Electronically Signed   By: Obie Dredge M.D.   On: 01/04/2023 12:13     Assessment and Plan:   Bradycardia Patient has a history of bradycardia. Prior monitor in sinus bradycardia with average heart rate of 55 bpm and 9% ventricular ectopy but no concerning arrhythmias. He was admitted after a mechanical fall and was found to have acute hypoxic respiratory failure secondary to  aspiration pneumonia and COVID. We were consulted for bradycardia. I do not think this was the cause of his fall. Fall was clearly mechanical in nature. He denies any cardiac symptoms. Telemetry shows sinus rhythm with rates mostly in the 50s but occasionally in the 40s. However, hear rate will increase with activity. His heart rates increased to the 70s to 80s just moving around in bed. He does have frequent PACs/ PVCs with some short compensatory pauses. But no significant bradycardia or high grade AV block. No indication for pacemaker at this time. He seems to be at his baseline. TSH normal. Potassium slightly low - will replete.  Otherwise, per primary team: - Acute hypoxic respiratory failure - Aspiration pneumonia - COVID - Chronic thrombocytopenia   Risk  Assessment/Risk Scores:  {  For questions or updates, please contact [facility] Please consult [email or web address] for contact info under    Signed, Corrin Parker, PA-C  01/05/2023 5:37 PM  Patient seen and examined   I agree with findings as noted by Thane Edu above Pt is a 89 yo with known bradycardia   Admitted after a fall, vomiting   Found to have COVID Here in hospital he has been on tele   HRs have been 30s to 80s with some PVCs  No significant pauses BP initially low  IMproved with IV fluids    ON exam, he is comfortable laying in bed   The pt denies dizziness   Breathing is OK  Neck  JVP is normal Lungs are CTA   Cardiac exam   RRR  No S3  No murmurs  Abd is supple  Ext are without edema   2+ pulses   Impression Bradycardia   Does not appear hemodynamically destaabilizing   No indication for pacer  Avoid drugs that slow the heart   Replete electrolytes   Will sign off   Please call with questions  Dietrich Pates MD

## 2023-01-05 NOTE — Progress Notes (Signed)
   01/05/23 0050  Vitals  BP 107/82  MAP (mmHg) 90  Pulse Rate (!) 41  ECG Heart Rate (!) 31  Resp 20  MEWS COLOR  MEWS Score Color Yellow  Oxygen Therapy  SpO2 98 %  MEWS Score  MEWS Temp 0  MEWS Systolic 0  MEWS Pulse 2  MEWS RR 0  MEWS LOC 0  MEWS Score 2  Provider Notification  Provider Name/Title Imogene Burn DO  Date Provider Notified 01/05/23  Time Provider Notified 0050  Method of Notification Page  Notification Reason Change in status  Provider response No new orders   PT arrived from ED. VSS noted above- HR ranging from 20s-50s. EKG obtained. TRH on call notified. Pt otherwise asymptomatic w/ stable BPs, AOx3, no complaints. Pt and daughter updated on plan of care.

## 2023-01-05 NOTE — TOC CAGE-AID Note (Signed)
Transition of Care [facility]) - CAGE-AID Screening   Patient Details  Name: Jason Barton MRN: [id number] Date of Birth: September 29, 1925  Transition of Care [facility]) CM/SW Contact:    Katha Hamming, RN Phone Number: 01/05/2023, 7:37 PM   Clinical Narrative:    CAGE-AID Screening:    Have You Ever Felt You Ought to Cut Down on Your Drinking or Drug Use?: No Have People Annoyed You By [employer] Your Drinking Or Drug Use?: No Have You Felt Bad Or Guilty About Your Drinking Or Drug Use?: No Have You Ever Had a Drink or Used Drugs First Thing In The Morning to Steady Your Nerves or to Get Rid of a Hangover?: No CAGE-AID Score: 0  Substance Abuse Education Offered: No

## 2023-01-05 NOTE — Progress Notes (Signed)
Patient received via stretcher from E.R. with R.N. alert and orin. Skin warm and dry and pale no pain or complaints. Orin. To room Reynolds R.Nl into assess patient. H.R. in the 22's S.B. came in with low H.R. Daughter at bedside.

## 2023-01-05 NOTE — Evaluation (Signed)
Physical Therapy Evaluation Patient Details Name: Jason Barton MRN: [id number] DOB: [birth year] Today's Date: 01/05/2023  History of Present Illness  89 yo male presents to [facility] from [place] on 7/17 s/p fall, aspiration event secondary to vomiting. + covid19. PMH includes TIA, hyperlipidemia, hypothyroidism, COPD, constipation, bradycardia, bladder outlet obstruction, scoliosis, anxiety, LBP.  Clinical Impression   Pt presents with generalized weakness, impaired balance but no use of AD, impaired activity tolerance vs baseline. Pt to benefit from acute PT to address deficits. Pt ambulated hallway distance without AD and close guard for safety, no physical assist during assessment except to elevate trunk off of bed. Pt is independent with mobility and assists his wife with dementia at baseline. PT recommending HH services post-acutely. PT to progress mobility as tolerated, and will continue to follow acutely.      SpO2 91-100% on RA during gait, left on RA at end of session satting 97% HR 38-72 during session      Assistance Recommended at Discharge PRN  If plan is discharge home, recommend the following:  Can travel by private vehicle  A little help with walking and/or transfers        Equipment Recommendations None recommended by PT  Recommendations for Other Services       Functional Status Assessment Patient has had a recent decline in their functional status and demonstrates the ability to make significant improvements in function in a reasonable and predictable amount of time.     Precautions / Restrictions Precautions Precautions: Fall Restrictions Weight Bearing Restrictions: No      Mobility  Bed Mobility Overal bed mobility: Needs Assistance Bed Mobility: Supine to Sit, Sit to Supine     Supine to sit: HOB elevated, Min assist Sit to supine: Supervision, HOB elevated   General bed mobility comments: light assist for trunk elevation, increased time and  use of bedrails to perform    Transfers Overall transfer level: Needs assistance Equipment used: 1 person hand held assist Transfers: Sit to/from Stand Sit to Stand: Min guard           General transfer comment: safety, slow to rise and reaches for environment once standing    Ambulation/Gait Ambulation/Gait assistance: Min assist Gait Distance (Feet): 150 Feet   Gait Pattern/deviations: Step-through pattern, Decreased stride length Gait velocity: decr     General Gait Details: slowed, no AD use. SpO2 93-100% on RA  Stairs            Wheelchair Mobility     Tilt Bed    Modified Rankin (Stroke Patients Only)       Balance Overall balance assessment: Mild deficits observed, not formally tested Sitting-balance support: No upper extremity supported, Feet supported Sitting balance-Leahy Scale: Fair     Standing balance support: During functional activity Standing balance-Leahy Scale: Fair                               Pertinent Vitals/Pain Pain Assessment Pain Assessment: No/denies pain    Home Living Family/patient expects to be discharged to:: Private residence Living Arrangements: Spouse/significant other Available Help at Discharge: Family Type of Home: Independent living facility Home Access: Level entry       Home Layout: One level Home Equipment: None      Prior Function Prior Level of Function : Independent/Modified Independent             Mobility Comments: no AD, takes care of  his wife with dementia ADLs Comments: independent     Hand Dominance   Dominant Hand: Right    Extremity/Trunk Assessment   Upper Extremity Assessment Upper Extremity Assessment: Defer to OT evaluation    Lower Extremity Assessment Lower Extremity Assessment: Generalized weakness    Cervical / Trunk Assessment Cervical / Trunk Assessment: Kyphotic;Other exceptions Cervical / Trunk Exceptions: scoliosis  Communication    Communication: No difficulties  Cognition Arousal/Alertness: Awake/alert Behavior During Therapy: WFL for tasks assessed/performed Overall Cognitive Status: Within Functional Limits for tasks assessed                                          General Comments General comments (skin integrity, edema, etc.): vss on RA, HR 38-72 bpm during session (has been upper 30s-40s at rest)    Exercises     Assessment/Plan    PT Assessment Patient needs continued PT services  PT Problem List Decreased strength;Decreased mobility;Decreased balance;Decreased knowledge of use of DME;Decreased activity tolerance;Decreased safety awareness;Cardiopulmonary status limiting activity       PT Treatment Interventions DME instruction;Therapeutic activities;Patient/family education;Therapeutic exercise;Gait training;Stair training;Balance training;Neuromuscular re-education;Functional mobility training    PT Goals (Current goals can be found in the Care Plan section)  Acute Rehab PT Goals Patient Stated Goal: home with wife PT Goal Formulation: With patient Time For Goal Achievement: 01/19/23 Potential to Achieve Goals: Good    Frequency Min 1X/week     Co-evaluation               AM-PAC PT "6 Clicks" Mobility  Outcome Measure Help needed turning from your back to your side while in a flat bed without using bedrails?: None Help needed moving from lying on your back to sitting on the side of a flat bed without using bedrails?: A Little Help needed moving to and from a bed to a chair (including a wheelchair)?: A Little Help needed standing up from a chair using your arms (Barton.g., wheelchair or bedside chair)?: A Little Help needed to walk in hospital room?: A Little Help needed climbing 3-5 steps with a railing? : A Little 6 Click Score: 19    End of Session   Activity Tolerance: Patient tolerated treatment well Patient left: in bed;with call bell/phone within reach;with bed  alarm set Nurse Communication: Mobility status;Other (comment) (pt on RA, RN aware and approves) PT Visit Diagnosis: Muscle weakness (generalized) (M62.81);Other abnormalities of gait and mobility (R26.89)    Time: 1300-1326 PT Time Calculation (min) (ACUTE ONLY): 26 min   Charges:   PT Evaluation $PT Eval Low Complexity: 1 Low PT Treatments $Gait Training: 8-22 mins PT General Charges $$ ACUTE PT VISIT: 1 Visit         Marye Round, PT DPT Acute Rehabilitation Services Secure Chat Preferred  Office [phone]   Jason Barton Christain [place] 01/05/2023, 2:59 PM

## 2023-01-05 NOTE — Evaluation (Signed)
Clinical/Bedside Swallow Evaluation Patient Details  Name: Jason Barton MRN: [id number] Date of Birth: 06-Oct-1925  Today's Date: 01/05/2023 Time: SLP Start Time (ACUTE ONLY): 1133 SLP Stop Time (ACUTE ONLY): 1149 SLP Time Calculation (min) (ACUTE ONLY): 16 min  Past Medical History:  Past Medical History:  Diagnosis Date   COPD (chronic obstructive pulmonary disease) (HCC) 05/21/2011   Diverticulosis of colon 05/21/2011   Hypothyroidism    Impaired glucose tolerance 06/08/2012   Increased prostate specific antigen (PSA) velocity 05/24/2011   Nonmelanoma skin cancer 05/21/2011   S/P appendectomy 05/21/2011   S/P inguinal hernia repair 05/21/2011   S/p small bowel obstruction 05/21/2011   Thoracic scoliosis 05/21/2011   Past Surgical History:  Past Surgical History:  Procedure Laterality Date   APPENDECTOMY  1939   COLON RESECTION     2009 for a "kink in colon" adhesions after appendectomy   SHOULDER SURGERY     dr sypher; rotater cuff   STRABISMUS SURGERY Right 11/21/2014   Procedure: REPAIR STRABISMUS RIGHT EYE ;  Surgeon: Verne Carrow, MD;  Location: [facility];  Service: Ophthalmology;  Laterality: Right;   TONSILLECTOMY  1942   HPI:  Jason Barton is a 89 y.o. male with medical history significant of TIA, hyperlipidemia, hypothyroidism, COPD, constipation, bradycardia, bladder outlet obstruction, scoliosis, anxiety, low back pain presenting after a fall. Found to have COVID. Per chart suspicion for aspiration while he was on the floor as he was found in his vomit by EMS who thought he was there for at least several minutes if not longer. CXR no active disease 7/17.    Assessment / Plan / Recommendation  Clinical Impression  Pt alert, pleasant and conversive with his daughter at bedside. He denies prior or current difficulty swallowing. He reports having "phlegm the last several days" likely from Covid. He has intact dentition, normal labial/lingual movements  and strong volitional cough. He had a cough with initial sip of ice water but prior to this he verbalized immediately after swallowing stating "the water is so cold, I never drink ice water." Suspect combination of cold and abduction of vocal cords contributed to this response. All other sips of room temperature water were consumed with brisk swallow and there was no coughing, throat clearing or changes in vocal quality with multiple sips. Applesauce trials were consumed swifly without incident. His mastication was within functional limits over several trials of solid. Pt's respiratory status at baseline was stable and remained throughout assessment and Spo2 98%. Recommend he continue regular texture, thin liquids, pills with water. No further ST is needed. SLP Visit Diagnosis: Dysphagia, unspecified (R13.10)    Aspiration Risk  Mild aspiration risk    Diet Recommendation Regular;Thin liquid    Liquid Administration via: Straw;Cup Medication Administration: Whole meds with liquid Supervision: Patient able to self feed Compensations: Slow rate;Small sips/bites Postural Changes: Seated upright at 90 degrees    Other  Recommendations Oral Care Recommendations: Oral care BID    Recommendations for follow up therapy are one component of a multi-disciplinary discharge planning process, led by the attending physician.  Recommendations may be updated based on patient status, additional functional criteria and insurance authorization.  Follow up Recommendations No SLP follow up      Assistance Recommended at Discharge    Functional Status Assessment Patient has not had a recent decline in their functional status  Frequency and Duration            Prognosis  Swallow Study   General Date of Onset: 01/04/23 HPI: Jason Barton is a 89 y.o. male with medical history significant of TIA, hyperlipidemia, hypothyroidism, COPD, constipation, bradycardia, bladder outlet obstruction,  scoliosis, anxiety, low back pain presenting after a fall. Found to have COVID. Per chart suspicion for aspiration while he was on the floor as he was found in his vomit by EMS who thought he was there for at least several minutes if not longer. CXR no active disease 7/17. Type of Study: Bedside Swallow Evaluation Previous Swallow Assessment:  (none) Diet Prior to this Study: Regular;Thin liquids (Level 0) Temperature Spikes Noted: No Respiratory Status: Nasal cannula History of Recent Intubation: No Behavior/Cognition: Alert;Cooperative;Pleasant mood Oral Cavity Assessment: Within Functional Limits Oral Care Completed by SLP: No Oral Cavity - Dentition: Adequate natural dentition Vision: Functional for self-feeding Self-Feeding Abilities: Able to feed self Patient Positioning: Upright in bed Baseline Vocal Quality: Normal Volitional Cough: Strong Volitional Swallow: Able to elicit    Oral/Motor/Sensory Function Overall Oral Motor/Sensory Function: Within functional limits   Ice Chips Ice chips: Not tested   Thin Liquid Thin Liquid: Impaired Presentation: Cup Oral Phase Impairments:  (none) Oral Phase Functional Implications:  (none) Pharyngeal  Phase Impairments: Cough - Immediate (x 1 - see impressions)    Nectar Thick Nectar Thick Liquid: Not tested   Honey Thick Honey Thick Liquid: Not tested   Puree Puree: Within functional limits   Solid     Solid: Within functional limits      Jason Barton 01/05/2023,12:40 PM

## 2023-01-05 NOTE — Progress Notes (Signed)
PROGRESS NOTE    NOLYN SWAB  IRC:[id number] DOB: 06-May-1926 DOA: 01/04/2023 PCP: Corwin Levins, MD  Chief Complaint  Patient presents with   Level 2   Fall on [place]    Brief Narrative:   DMARI SCHUBRING is Katya Rolston 89 y.o. male with medical history significant of TIA, hyperlipidemia, hypothyroidism, COPD, constipation, bradycardia, bladder outlet obstruction, scoliosis, anxiety, low back pain presenting after Sefora Tietje fall.   Patient had been feeling well for the past couple days with some lethargy and fevers.  Earlier today he stepped backwards and tried to lean into Griselle Rufer closet door which he thought was closed and fell into the closet as it was open.  Unclear if he hit his head or lost consciousness, there was some swelling noted by EMS.  He currently is on warfarin for atrial fibrillation.  Suspicion for aspiration while he was on the floor as he was found in his vomit by EMS who thought he was there for at least several minutes if not longer.  EMS did show 1 episode of bradycardia into the 30s and route.  Initially placed on nonrebreather but able to be weaned to 4 L supplemental oxygen.   He's been found to have covid.  Currently on dexamethasone and remdesivir.    See below for additional details   Patient denies chills, chest pain, abdominal pain, constipation, diarrhea.   Of note, wife lives in the same facility as patient and has dementia.    Assessment & Plan:   Principal Problem:   Acute respiratory failure with hypoxia (HCC) Active Problems:   COPD (chronic obstructive pulmonary disease) (HCC)   Hyperlipidemia   Hypothyroidism   Bladder outlet obstruction   History of TIA (transient ischemic attack)   Anxiety   COVID-19 virus infection   Hypotension   COVID  Acute respiratory failure with hypoxia Aspiration Pneumonia Covid 19 Virus Infection  - currently requiring 1.5 L [place] - CT with ground glass airspace opacities throughout the right lung base (aspiration vs  infection)  - concern for aspiration  - currently on dexamethasone, remdesivir - on empiric abx to cover possible bacterial pneumonia - covid 19 positive - strict I/O, daily weights    Bradycardia Rates ranging from 30's-50's Appreciate cardiology assistance   Fall Suspect this is related to generalized weakness with covid 19 infection PT/OT CT CTL spine without fracture  Soft tissue contusion of R forehead   History of TIA Hyperlipidemia - Continue home aspirin, rosuvastatin   Hypothyroidism - Continue home Synthroid - TSH wnl   Bladder outlet obstruction - home alfuzosin    History of COPD > Listed in chart but not on any medication    Thrombocytopenia > Platelets currently 71 down from baseline of 90-1 30. - Will hold off on chemoprophylaxis for DVT - related to covid infection?  Constipation - episode of incontinence today, overflow?   - likely needs bowel regimen (hold for now with incontinence)     DVT prophylaxis: scd Code Status: full Family Communication: daughter Disposition:   Status is: Inpatient Remains inpatient appropriate because: need for continued inpatient care   Consultants:  cardiology  Procedures:  none  Antimicrobials:  Anti-infectives (From admission, onward)    Start     Dose/Rate Route Frequency Ordered Stop   01/05/23 2000  azithromycin (ZITHROMAX) 500 mg in sodium chloride 0.9 % 250 mL IVPB        500 mg 250 mL/hr over 60 Minutes Intravenous Every 24 hours 01/04/23 1655  01/05/23 1000  remdesivir 100 mg in sodium chloride 0.9 % 100 mL IVPB       Placed in "Followed by" Linked Group   100 mg 200 mL/hr over 30 Minutes Intravenous Daily 01/04/23 1640 01/07/23 0959   01/05/23 0000  cefTRIAXone (ROCEPHIN) 2 g in sodium chloride 0.9 % 100 mL IVPB        2 g 200 mL/hr over 30 Minutes Intravenous Every 24 hours 01/04/23 1655     01/04/23 2000  remdesivir 200 mg in sodium chloride 0.9% 250 mL IVPB       Placed in "Followed  by" Linked Group   200 mg 580 mL/hr over 30 Minutes Intravenous Once 01/04/23 1640 01/04/23 2243   01/04/23 1200  vancomycin (VANCOREADY) IVPB 1250 mg/250 mL        1,250 mg 166.7 mL/hr over 90 Minutes Intravenous  Once 01/04/23 1148 01/04/23 2109   01/04/23 1145  ceFEPIme (MAXIPIME) 2 g in sodium chloride 0.9 % 100 mL IVPB        2 g 200 mL/hr over 30 Minutes Intravenous  Once 01/04/23 1136 01/04/23 1302   01/04/23 1145  metroNIDAZOLE (FLAGYL) IVPB 500 mg        500 mg 100 mL/hr over 60 Minutes Intravenous  Once 01/04/23 1136 01/04/23 1437   01/04/23 1145  vancomycin (VANCOCIN) IVPB 1000 mg/200 mL premix  Status:  Discontinued        1,000 mg 200 mL/hr over 60 Minutes Intravenous  Once 01/04/23 1136 01/04/23 1148       Subjective: Daughter at bedside Feels better overall   Objective: Vitals:   01/05/23 0356 01/05/23 0541 01/05/23 1000 01/05/23 1025  BP: 117/64 110/84  105/69  Pulse: (!) 45 (!) 44 65 60  Resp: 19 18 18 18   Temp: 98.4 F (36.9 C) 98 F (36.7 C)  98.8 F (37.1 C)  TempSrc: Oral Oral  Oral  SpO2: 97% 96% 95% 97%  Weight:      Height:        Intake/Output Summary (Last 24 hours) at 01/05/2023 1728 Last data filed at 01/05/2023 0747 Gross per 24 hour  Intake 1340 ml  Output 1400 ml  Net -60 ml   Filed Weights   01/04/23 2310  Weight: 59.5 kg    Examination:  General exam: Appears calm and comfortable, generally weak Respiratory system: good air movement bilaterally Cardiovascular system: bradycardic Gastrointestinal system: Abdomen is nondistended, soft and nontender.  Central nervous system: Alert and oriented. No focal neurological deficits. Extremities: no LEE    Data Reviewed: I have personally reviewed following labs and imaging studies  CBC: Recent Labs  Lab 01/04/23 1130 01/04/23 1139 01/05/23 0714  WBC 3.5*  --  7.5  HGB 13.4 13.3 13.3  HCT 40.7 39.0 39.9  MCV 91.9  --  93.9  PLT 71*  --  76*    Basic Metabolic  Panel: Recent Labs  Lab 01/04/23 1130 01/04/23 1139 01/05/23 0714  NA 136 137 133*  K 3.8 3.7 3.3*  CL 100 100 102  CO2 23  --  24  GLUCOSE 131* 127* 123*  BUN 18 20 14   CREATININE 1.00 0.90 0.93  CALCIUM 8.5*  --  7.6*    GFR: Estimated Creatinine Clearance: 40.9 mL/min (by C-G formula based on SCr of 0.93 mg/dL).  Liver Function Tests: Recent Labs  Lab 01/04/23 1130 01/05/23 0714  AST 27 25  ALT 19 17  ALKPHOS 65 49  BILITOT 0.6 0.4  PROT 5.9* 4.9*  ALBUMIN 3.6 2.7*    CBG: No results for input(s): "GLUCAP" in the last 168 hours.   Recent Results (from the past 240 hour(s))  Resp panel by RT-PCR (RSV, Flu Dala Breault&B, Covid) Anterior Nasal Swab     Status: Abnormal   Collection Time: 01/04/23 11:20 AM   Specimen: Anterior Nasal Swab  Result Value Ref Range Status   SARS Coronavirus 2 by RT PCR POSITIVE (Keerat Denicola) NEGATIVE Final   Influenza Ziyonna Christner by PCR NEGATIVE NEGATIVE Final   Influenza B by PCR NEGATIVE NEGATIVE Final    Comment: (NOTE) The Xpert Xpress SARS-CoV-2/FLU/RSV plus assay is intended as an aid in the diagnosis of influenza from Nasopharyngeal swab specimens and should not be used as Delani Kohli sole basis for treatment. Nasal washings and aspirates are unacceptable for Xpert Xpress SARS-CoV-2/FLU/RSV testing.  Fact Sheet for Patients: BloggerCourse.com  Fact Sheet for Healthcare Providers: SeriousBroker.it  This test is not yet approved or cleared by the [place] FDA and has been authorized for detection and/or diagnosis of SARS-CoV-2 by FDA under an Emergency Use Authorization (EUA). This EUA will remain in effect (meaning this test can be used) for the duration of the COVID-19 declaration under Section 564(b)(1) of the Act, 21 U.S.C. section 360bbb-3(b)(1), unless the authorization is terminated or revoked.     Resp Syncytial Virus by PCR NEGATIVE NEGATIVE Final    Comment: (NOTE) Fact Sheet for  Patients: BloggerCourse.com  Fact Sheet for Healthcare Providers: SeriousBroker.it  This test is not yet approved or cleared by the [place] FDA and has been authorized for detection and/or diagnosis of SARS-CoV-2 by FDA under an Emergency Use Authorization (EUA). This EUA will remain in effect (meaning this test can be used) for the duration of the COVID-19 declaration under Section 564(b)(1) of the Act, 21 U.S.C. section 360bbb-3(b)(1), unless the authorization is terminated or revoked.  Performed at [facility], [address], [place], [place] [address]   Blood Culture (routine x 2)     Status: None (Preliminary result)   Collection Time: 01/04/23 11:30 AM   Specimen: BLOOD  Result Value Ref Range Status   Specimen Description BLOOD RIGHT ANTECUBITAL  Final   Special Requests   Final    BOTTLES DRAWN AEROBIC AND ANAEROBIC Blood Culture results may not be optimal due to an excessive volume of blood received in culture bottles   Culture   Final    NO GROWTH < 24 HOURS Performed at [facility], [address] [place], [place] [address]    Report Status PENDING  Incomplete  Blood Culture (routine x 2)     Status: None (Preliminary result)   Collection Time: 01/04/23 11:47 AM   Specimen: BLOOD  Result Value Ref Range Status   Specimen Description BLOOD SITE NOT SPECIFIED  Final   Special Requests   Final    BOTTLES DRAWN AEROBIC AND ANAEROBIC Blood Culture results may not be optimal due to an inadequate volume of blood received in culture bottles   Culture   Final    NO GROWTH < 24 HOURS Performed at [facility], [address], [place], [place] [address]    Report Status PENDING  Incomplete         Radiology Studies: CT HEAD WO CONTRAST  Result Date: 01/04/2023 CLINICAL DATA:  Fall EXAM: CT HEAD WITHOUT CONTRAST CT CERVICAL SPINE WITHOUT CONTRAST TECHNIQUE: Multidetector CT imaging of the  head and cervical spine was performed  following the standard protocol without intravenous contrast. Multiplanar CT image reconstructions of the cervical spine were also generated. RADIATION DOSE REDUCTION: This exam was performed according to the departmental dose-optimization program which includes automated exposure control, adjustment of the mA and/or kV according to patient size and/or use of iterative reconstruction technique. COMPARISON:  06/23/2021 FINDINGS: CT HEAD FINDINGS Brain: No evidence of acute infarction, hemorrhage, hydrocephalus, extra-axial collection or mass lesion/mass effect. Periventricular and deep white matter hypodensity. Vascular: No hyperdense vessel or unexpected calcification. Skull: Normal. Negative for fracture or focal lesion. Sinuses/Orbits: No acute finding. Other: Soft tissue contusion of the right forehead (series 3, image 22). CT CERVICAL SPINE FINDINGS Alignment: Normal. Skull base and vertebrae: No acute fracture. No primary bone lesion or focal pathologic process. Soft tissues and spinal canal: No prevertebral fluid or swelling. No visible canal hematoma. Disc levels: Mild-to-moderate disc space height loss and osteophytosis, worst at C3-C4. Upper chest: Negative. Other: None. IMPRESSION: 1. No acute intracranial pathology. Small-vessel white matter disease. 2. Soft tissue contusion of the right forehead. 3. No fracture or static subluxation of the cervical spine. 4. Mild-to-moderate cervical disc degenerative disease. Electronically Signed   By: Jearld Lesch M.D.   On: 01/04/2023 13:29   CT CERVICAL SPINE WO CONTRAST  Result Date: 01/04/2023 CLINICAL DATA:  Fall EXAM: CT HEAD WITHOUT CONTRAST CT CERVICAL SPINE WITHOUT CONTRAST TECHNIQUE: Multidetector CT imaging of the head and cervical spine was performed following the standard protocol without intravenous contrast. Multiplanar CT image reconstructions of the cervical spine were also generated. RADIATION DOSE  REDUCTION: This exam was performed according to the departmental dose-optimization program which includes automated exposure control, adjustment of the mA and/or kV according to patient size and/or use of iterative reconstruction technique. COMPARISON:  06/23/2021 FINDINGS: CT HEAD FINDINGS Brain: No evidence of acute infarction, hemorrhage, hydrocephalus, extra-axial collection or mass lesion/mass effect. Periventricular and deep white matter hypodensity. Vascular: No hyperdense vessel or unexpected calcification. Skull: Normal. Negative for fracture or focal lesion. Sinuses/Orbits: No acute finding. Other: Soft tissue contusion of the right forehead (series 3, image 22). CT CERVICAL SPINE FINDINGS Alignment: Normal. Skull base and vertebrae: No acute fracture. No primary bone lesion or focal pathologic process. Soft tissues and spinal canal: No prevertebral fluid or swelling. No visible canal hematoma. Disc levels: Mild-to-moderate disc space height loss and osteophytosis, worst at C3-C4. Upper chest: Negative. Other: None. IMPRESSION: 1. No acute intracranial pathology. Small-vessel white matter disease. 2. Soft tissue contusion of the right forehead. 3. No fracture or static subluxation of the cervical spine. 4. Mild-to-moderate cervical disc degenerative disease. Electronically Signed   By: Jearld Lesch M.D.   On: 01/04/2023 13:29   CT CHEST ABDOMEN PELVIS WO CONTRAST  Result Date: 01/04/2023 CLINICAL DATA:  Fall EXAM: CT CHEST, ABDOMEN, AND PELVIS WITHOUT CONTRAST CT THORACIC AND LUMBAR SPINE WITHOUT CONTRAST TECHNIQUE: Multidetector CT imaging of the chest, abdomen and pelvis was performed following the standard protocol without administration of intravenous contrast. Multidetector CT imaging of the thoracic and lumbar spine was performed following the standard protocol without administration of intravenous contrast. RADIATION DOSE REDUCTION: This exam was performed according to the departmental  dose-optimization program which includes automated exposure control, adjustment of the mA and/or kV according to patient size and/or use of iterative reconstruction technique. COMPARISON:  None Available. FINDINGS: CT CHEST FINDINGS Cardiovascular: Aortic atherosclerosis. Normal heart size. Left coronary artery calcifications. No pericardial effusion. Mediastinum/Nodes: No enlarged mediastinal, hilar, or axillary lymph nodes. Thyroid gland, trachea, and esophagus  demonstrate no significant findings. Lungs/Pleura: Scattered ground-glass airspace opacities throughout the right lung base (series 4, image 69). No pleural effusion or pneumothorax. Musculoskeletal: No chest wall mass or suspicious osseous lesions identified. CT ABDOMEN PELVIS FINDINGS Hepatobiliary: No solid liver abnormality is seen. No gallstones, gallbladder wall thickening, or biliary dilatation. Pancreas: Unremarkable. No pancreatic ductal dilatation or surrounding inflammatory changes. Spleen: Normal in size without significant abnormality. Adrenals/Urinary Tract: Adrenal glands are unremarkable. Kidneys are normal, without renal calculi, solid lesion, or hydronephrosis. Bladder is unremarkable. Stomach/Bowel: Stomach is within normal limits. Appendix appears normal. No evidence of bowel wall thickening, distention, or inflammatory changes. Descending and sigmoid diverticulosis. Large burden of stool throughout the colon and rectum. Vascular/Lymphatic: Aortic atherosclerosis. No enlarged abdominal or pelvic lymph nodes. Reproductive: Prostatomegaly. Other: Small, fat containing midline epigastric hernia (series 3, image 83). No ascites. Musculoskeletal: No acute osseous findings. CT THORACIC AND LUMBAR SPINE FINDINGS Alignment: Gentle dextroscoliosis of the thoracic spine, with otherwise normal thoracic kyphosis. Normal lumbar lordosis. Vertebral bodies: Intact. No fracture or dislocation. Disc spaces: Mild disc space height loss and osteophytosis  throughout the thoracic spine. Mild disc degenerative change L1-L2 with otherwise intact lumbar disc spaces. Mild facet degenerative change of the lower lumbar levels. Paraspinous soft tissues: Unremarkable. IMPRESSION: 1. Scattered ground-glass airspace opacities throughout the right lung base, consistent with infection or aspiration. 2. No noncontrast CT evidence of acute traumatic injury to the chest, abdomen, or pelvis. 3. No fracture or dislocation of the thoracic or lumbar spine. 4. Descending and sigmoid diverticulosis. 5. Large burden of stool throughout the colon and rectum. 6. Prostatomegaly. 7. Coronary artery disease. Aortic Atherosclerosis (ICD10-I70.0). Electronically Signed   By: Jearld Lesch M.D.   On: 01/04/2023 13:23   CT T-SPINE NO CHARGE  Result Date: 01/04/2023 CLINICAL DATA:  Fall EXAM: CT CHEST, ABDOMEN, AND PELVIS WITHOUT CONTRAST CT THORACIC AND LUMBAR SPINE WITHOUT CONTRAST TECHNIQUE: Multidetector CT imaging of the chest, abdomen and pelvis was performed following the standard protocol without administration of intravenous contrast. Multidetector CT imaging of the thoracic and lumbar spine was performed following the standard protocol without administration of intravenous contrast. RADIATION DOSE REDUCTION: This exam was performed according to the departmental dose-optimization program which includes automated exposure control, adjustment of the mA and/or kV according to patient size and/or use of iterative reconstruction technique. COMPARISON:  None Available. FINDINGS: CT CHEST FINDINGS Cardiovascular: Aortic atherosclerosis. Normal heart size. Left coronary artery calcifications. No pericardial effusion. Mediastinum/Nodes: No enlarged mediastinal, hilar, or axillary lymph nodes. Thyroid gland, trachea, and esophagus demonstrate no significant findings. Lungs/Pleura: Scattered ground-glass airspace opacities throughout the right lung base (series 4, image 69). No pleural effusion or  pneumothorax. Musculoskeletal: No chest wall mass or suspicious osseous lesions identified. CT ABDOMEN PELVIS FINDINGS Hepatobiliary: No solid liver abnormality is seen. No gallstones, gallbladder wall thickening, or biliary dilatation. Pancreas: Unremarkable. No pancreatic ductal dilatation or surrounding inflammatory changes. Spleen: Normal in size without significant abnormality. Adrenals/Urinary Tract: Adrenal glands are unremarkable. Kidneys are normal, without renal calculi, solid lesion, or hydronephrosis. Bladder is unremarkable. Stomach/Bowel: Stomach is within normal limits. Appendix appears normal. No evidence of bowel wall thickening, distention, or inflammatory changes. Descending and sigmoid diverticulosis. Large burden of stool throughout the colon and rectum. Vascular/Lymphatic: Aortic atherosclerosis. No enlarged abdominal or pelvic lymph nodes. Reproductive: Prostatomegaly. Other: Small, fat containing midline epigastric hernia (series 3, image 83). No ascites. Musculoskeletal: No acute osseous findings. CT THORACIC AND LUMBAR SPINE FINDINGS Alignment: Gentle dextroscoliosis of the thoracic spine, with  otherwise normal thoracic kyphosis. Normal lumbar lordosis. Vertebral bodies: Intact. No fracture or dislocation. Disc spaces: Mild disc space height loss and osteophytosis throughout the thoracic spine. Mild disc degenerative change L1-L2 with otherwise intact lumbar disc spaces. Mild facet degenerative change of the lower lumbar levels. Paraspinous soft tissues: Unremarkable. IMPRESSION: 1. Scattered ground-glass airspace opacities throughout the right lung base, consistent with infection or aspiration. 2. No noncontrast CT evidence of acute traumatic injury to the chest, abdomen, or pelvis. 3. No fracture or dislocation of the thoracic or lumbar spine. 4. Descending and sigmoid diverticulosis. 5. Large burden of stool throughout the colon and rectum. 6. Prostatomegaly. 7. Coronary artery disease.  Aortic Atherosclerosis (ICD10-I70.0). Electronically Signed   By: Jearld Lesch M.D.   On: 01/04/2023 13:23   CT L-SPINE NO CHARGE  Result Date: 01/04/2023 CLINICAL DATA:  Fall EXAM: CT CHEST, ABDOMEN, AND PELVIS WITHOUT CONTRAST CT THORACIC AND LUMBAR SPINE WITHOUT CONTRAST TECHNIQUE: Multidetector CT imaging of the chest, abdomen and pelvis was performed following the standard protocol without administration of intravenous contrast. Multidetector CT imaging of the thoracic and lumbar spine was performed following the standard protocol without administration of intravenous contrast. RADIATION DOSE REDUCTION: This exam was performed according to the departmental dose-optimization program which includes automated exposure control, adjustment of the mA and/or kV according to patient size and/or use of iterative reconstruction technique. COMPARISON:  None Available. FINDINGS: CT CHEST FINDINGS Cardiovascular: Aortic atherosclerosis. Normal heart size. Left coronary artery calcifications. No pericardial effusion. Mediastinum/Nodes: No enlarged mediastinal, hilar, or axillary lymph nodes. Thyroid gland, trachea, and esophagus demonstrate no significant findings. Lungs/Pleura: Scattered ground-glass airspace opacities throughout the right lung base (series 4, image 69). No pleural effusion or pneumothorax. Musculoskeletal: No chest wall mass or suspicious osseous lesions identified. CT ABDOMEN PELVIS FINDINGS Hepatobiliary: No solid liver abnormality is seen. No gallstones, gallbladder wall thickening, or biliary dilatation. Pancreas: Unremarkable. No pancreatic ductal dilatation or surrounding inflammatory changes. Spleen: Normal in size without significant abnormality. Adrenals/Urinary Tract: Adrenal glands are unremarkable. Kidneys are normal, without renal calculi, solid lesion, or hydronephrosis. Bladder is unremarkable. Stomach/Bowel: Stomach is within normal limits. Appendix appears normal. No evidence of bowel  wall thickening, distention, or inflammatory changes. Descending and sigmoid diverticulosis. Large burden of stool throughout the colon and rectum. Vascular/Lymphatic: Aortic atherosclerosis. No enlarged abdominal or pelvic lymph nodes. Reproductive: Prostatomegaly. Other: Small, fat containing midline epigastric hernia (series 3, image 83). No ascites. Musculoskeletal: No acute osseous findings. CT THORACIC AND LUMBAR SPINE FINDINGS Alignment: Gentle dextroscoliosis of the thoracic spine, with otherwise normal thoracic kyphosis. Normal lumbar lordosis. Vertebral bodies: Intact. No fracture or dislocation. Disc spaces: Mild disc space height loss and osteophytosis throughout the thoracic spine. Mild disc degenerative change L1-L2 with otherwise intact lumbar disc spaces. Mild facet degenerative change of the lower lumbar levels. Paraspinous soft tissues: Unremarkable. IMPRESSION: 1. Scattered ground-glass airspace opacities throughout the right lung base, consistent with infection or aspiration. 2. No noncontrast CT evidence of acute traumatic injury to the chest, abdomen, or pelvis. 3. No fracture or dislocation of the thoracic or lumbar spine. 4. Descending and sigmoid diverticulosis. 5. Large burden of stool throughout the colon and rectum. 6. Prostatomegaly. 7. Coronary artery disease. Aortic Atherosclerosis (ICD10-I70.0). Electronically Signed   By: Jearld Lesch M.D.   On: 01/04/2023 13:23   DG Chest Port 1 View  Result Date: 01/04/2023 CLINICAL DATA:  Level 2 trauma. Patient fell through door way. Complains of shortness of breath, fever and COVID exposure. EXAM: PORTABLE  CHEST 1 VIEW COMPARISON:  02/05/20 FINDINGS: Heart size and mediastinal contours are normal. Aortic atherosclerosis. Lungs appear hyperinflated with by lateral upper lobe scarring. No superimposed pleural effusion, interstitial edema or airspace consolidation. Curvature of the thoracic spine is convex towards the right. No acute osseous  findings. IMPRESSION: 1. No acute cardiopulmonary disease. 2.  Aortic Atherosclerosis (ICD10-I70.0). Electronically Signed   By: Signa Kell M.D.   On: 01/04/2023 12:18   DG Pelvis Portable  Result Date: 01/04/2023 CLINICAL DATA:  Fall. EXAM: PORTABLE PELVIS 1-2 VIEWS COMPARISON:  None Available. FINDINGS: There is no evidence of pelvic fracture or diastasis. No pelvic bone lesions are seen. IMPRESSION: Negative. Electronically Signed   By: Obie Dredge M.D.   On: 01/04/2023 12:13        Scheduled Meds:  dexamethasone (DECADRON) injection  6 mg Intravenous Q24H   levothyroxine  50 mcg Oral QAC breakfast   rosuvastatin  10 mg Oral Daily   sodium chloride flush  3 mL Intravenous Q12H   thiamine  50 mg Oral Daily   Continuous Infusions:  azithromycin     cefTRIAXone (ROCEPHIN)  IV Stopped (01/05/23 0101)   remdesivir 100 mg in sodium chloride 0.9 % 100 mL IVPB 100 mg (01/05/23 1038)     LOS: 0 days    Time spent: over 30 min    Lacretia Nicks, MD [facility]   To contact the attending provider between 7A-7P or the covering provider during after hours 7P-7A, please log into the web site [email or web address] and access using universal [place] password for that web site. If you do not have the password, please call the hospital operator.  01/05/2023, 5:28 PM

## 2023-01-06 DIAGNOSIS — J9601 Acute respiratory failure with hypoxia: Secondary | ICD-10-CM | POA: Diagnosis not present

## 2023-01-06 LAB — COMPREHENSIVE METABOLIC PANEL
ALT: 21 U/L (ref 0–44)
AST: 29 U/L (ref 15–41)
Albumin: 3 g/dL — ABNORMAL LOW (ref 3.5–5.0)
Alkaline Phosphatase: 52 U/L (ref 38–126)
Anion gap: 7 (ref 5–15)
BUN: 18 mg/dL (ref 8–23)
CO2: 25 mmol/L (ref 22–32)
Calcium: 8.1 mg/dL — ABNORMAL LOW (ref 8.9–10.3)
Chloride: 105 mmol/L (ref 98–111)
Creatinine, Ser: 0.86 mg/dL (ref 0.61–1.24)
GFR, Estimated: >60 mL/min (ref >60)
Glucose, Bld: 104 mg/dL — ABNORMAL HIGH (ref 70–99)
Potassium: 3.7 mmol/L (ref 3.5–5.1)
Sodium: 137 mmol/L (ref 135–145)
Total Bilirubin: 0.4 mg/dL (ref 0.3–1.2)
Total Protein: 5.4 g/dL — ABNORMAL LOW (ref 6.5–8.1)

## 2023-01-06 LAB — PHOSPHORUS: Phosphorus: 2 mg/dL — ABNORMAL LOW (ref 2.5–4.6)

## 2023-01-06 LAB — CBC WITH DIFFERENTIAL/PLATELET
Abs Immature Granulocytes: 0.01 10*3/uL (ref 0.00–0.07)
Basophils Absolute: 0 10*3/uL (ref 0.0–0.1)
Basophils Relative: 0 %
Eosinophils Absolute: 0 10*3/uL (ref 0.0–0.5)
Eosinophils Relative: 0 %
HCT: 43.2 % (ref 39.0–52.0)
Hemoglobin: 14.4 g/dL (ref 13.0–17.0)
Immature Granulocytes: 0 %
Lymphocytes Relative: 13 %
Lymphs Abs: 0.7 10*3/uL (ref 0.7–4.0)
MCH: 30.1 pg (ref 26.0–34.0)
MCHC: 33.3 g/dL (ref 30.0–36.0)
MCV: 90.4 fL (ref 80.0–100.0)
Monocytes Absolute: 1.1 10*3/uL — ABNORMAL HIGH (ref 0.1–1.0)
Monocytes Relative: 20 %
Neutro Abs: 3.6 10*3/uL (ref 1.7–7.7)
Neutrophils Relative %: 67 %
Platelets: 95 10*3/uL — ABNORMAL LOW (ref 150–400)
RBC: 4.78 MIL/uL (ref 4.22–5.81)
RDW: 14.4 % (ref 11.5–15.5)
WBC: 5.3 10*3/uL (ref 4.0–10.5)
nRBC: 0 % (ref 0.0–0.2)

## 2023-01-06 LAB — C-REACTIVE PROTEIN: CRP: 4.9 mg/dL — ABNORMAL HIGH (ref <1.0)

## 2023-01-06 LAB — D-DIMER, QUANTITATIVE: D-Dimer, Quant: 1.04 ug/mL-FEU — ABNORMAL HIGH (ref 0.00–0.50)

## 2023-01-06 LAB — MAGNESIUM: Magnesium: 1.8 mg/dL (ref 1.7–2.4)

## 2023-01-06 LAB — CULTURE, BLOOD (ROUTINE X 2): Culture: NO GROWTH

## 2023-01-06 MED ORDER — AZITHROMYCIN 500 MG PO TABS
500.0000 mg | ORAL_TABLET | Freq: Every day | ORAL | Status: DC
  Administered 2023-01-06: 500 mg via ORAL
  Filled 2023-01-06 (×2): qty 1

## 2023-01-06 MED ORDER — DEXAMETHASONE 6 MG PO TABS
6.0000 mg | ORAL_TABLET | Freq: Every day | ORAL | Status: DC
  Administered 2023-01-07 – 2023-01-08 (×2): 6 mg via ORAL
  Filled 2023-01-06 (×2): qty 1

## 2023-01-06 MED ORDER — ENOXAPARIN SODIUM 40 MG/0.4ML IJ SOSY
40.0000 mg | PREFILLED_SYRINGE | INTRAMUSCULAR | Status: DC
  Administered 2023-01-06 – 2023-01-07 (×2): 40 mg via SUBCUTANEOUS
  Filled 2023-01-06: qty 0.4

## 2023-01-06 MED ORDER — ENOXAPARIN SODIUM 40 MG/0.4ML IJ SOSY
PREFILLED_SYRINGE | INTRAMUSCULAR | Status: AC
  Filled 2023-01-06: qty 0.4

## 2023-01-06 MED ORDER — POTASSIUM CHLORIDE 20 MEQ PO PACK
40.0000 meq | PACK | Freq: Once | ORAL | Status: AC
  Administered 2023-01-06: 40 meq via ORAL

## 2023-01-06 MED ORDER — POTASSIUM CHLORIDE 20 MEQ PO PACK
PACK | ORAL | Status: AC
  Filled 2023-01-06: qty 2

## 2023-01-06 NOTE — Plan of Care (Signed)
  Problem: Education: Goal: Knowledge of risk factors and measures for prevention of condition will improve Outcome: Progressing   Problem: Coping: Goal: Psychosocial and spiritual needs will be supported Outcome: Progressing   Problem: Respiratory: Goal: Will maintain a patent airway Outcome: Progressing Goal: Complications related to the disease process, condition or treatment will be avoided or minimized Outcome: Progressing   

## 2023-01-06 NOTE — Evaluation (Signed)
Occupational Therapy Evaluation Patient Details Name: SHAQUEL CHAVOUS MRN: [id number] DOB: [birth year] Today's Date: 01/06/2023   History of Present Illness 89 yo male presents to [facility] from [place] on 7/17 s/p fall, aspiration event secondary to vomiting. + covid19. PMH includes TIA, hyperlipidemia, hypothyroidism, COPD, constipation, bradycardia, bladder outlet obstruction, scoliosis, anxiety, LBP.   Clinical Impression   Pt is typically independent, drives and is the caregiver for his wife. They recently moved to [facility] where they are provided some meals. He reports his wife is assisted for med management and has an aide to help with ADLs. Pt is oriented, but demonstrates some confusion, no family available to determine baseline. He is overall functioning at a supervision level in ADLs and mobility without AD.  VSS on RA. Recommending HHOT upon discharge.      Recommendations for follow up therapy are one component of a multi-disciplinary discharge planning process, led by the attending physician.  Recommendations may be updated based on patient status, additional functional criteria and insurance authorization.   Assistance Recommended at Discharge Frequent or constant Supervision/Assistance  Patient can return home with the following Direct supervision/assist for medications management;Direct supervision/assist for financial management;Assist for transportation;Help with stairs or ramp for entrance;Assistance with cooking/housework    Functional Status Assessment  Patient has had a recent decline in their functional status and demonstrates the ability to make significant improvements in function in a reasonable and predictable amount of time.  Equipment Recommendations  None recommended by OT    Recommendations for Other Services       Precautions / Restrictions Precautions Precautions: Fall Restrictions Weight Bearing Restrictions: No      Mobility Bed  Mobility Overal bed mobility: Modified Independent                  Transfers Overall transfer level: Needs assistance Equipment used: None Transfers: Sit to/from Stand Sit to Stand: Supervision                  Balance Overall balance assessment: Mild deficits observed, not formally tested   Sitting balance-Leahy Scale: Good       Standing balance-Leahy Scale: Fair                             ADL either performed or assessed with clinical judgement   ADL Overall ADL's : Needs assistance/impaired Eating/Feeding: Independent;Bed level   Grooming: Oral care;Standing;Supervision/safety   Upper Body Bathing: Supervision/ safety;Sitting   Lower Body Bathing: Supervison/ safety;Sit to/from stand   Upper Body Dressing : Set up;Sitting   Lower Body Dressing: Supervision/safety;Sit to/from stand   Toilet Transfer: Supervision/safety;Ambulation   Toileting- Clothing Manipulation and Hygiene: Supervision/safety;Sit to/from stand       Functional mobility during ADLs: Supervision/safety       Vision Ability to See in Adequate Light: 0 Adequate Patient Visual Report: No change from baseline       Perception     Praxis      Pertinent Vitals/Pain Pain Assessment Pain Assessment: No/denies pain     Hand Dominance Right   Extremity/Trunk Assessment Upper Extremity Assessment Upper Extremity Assessment: Overall WFL for tasks assessed   Lower Extremity Assessment Lower Extremity Assessment: Defer to PT evaluation   Cervical / Trunk Assessment Cervical / Trunk Assessment: Kyphotic   Communication Communication Communication: No difficulties   Cognition Arousal/Alertness: Awake/alert Behavior During Therapy: WFL for tasks assessed/performed Overall Cognitive Status: No family/caregiver present to determine  baseline cognitive functioning                                 General Comments: pt oriented, but difficulty  following commands at times and got lost in his room     General Comments       Exercises     Shoulder Instructions      Home Living Family/patient expects to be discharged to:: Private residence Living Arrangements: Spouse/significant other Available Help at Discharge: Family;Available PRN/intermittently Type of Home: Independent living facility Home Access: Level entry     Home Layout: One level     Bathroom Shower/Tub: Producer, television/film/video: Handicapped height     Home Equipment: Grab bars - tub/shower;Grab bars - toilet          Prior Functioning/Environment Prior Level of Function : Independent/Modified Independent;Driving             Mobility Comments: no AD, takes care of his wife with dementia ADLs Comments: independent        OT Problem List: Decreased cognition;Decreased strength      OT Treatment/Interventions: Self-care/ADL training;Cognitive remediation/compensation;Balance training;Patient/family education    OT Goals(Current goals can be found in the care plan section) Acute Rehab OT Goals OT Goal Formulation: With patient Time For Goal Achievement: 01/20/23 Potential to Achieve Goals: Good  OT Frequency: Min 1X/week    Co-evaluation              AM-PAC OT "6 Clicks" Daily Activity     Outcome Measure Help from another person eating meals?: None Help from another person taking care of personal grooming?: A Little Help from another person toileting, which includes using toliet, bedpan, or urinal?: A Little Help from another person bathing (including washing, rinsing, drying)?: A Little Help from another person to put on and taking off regular upper body clothing?: A Little Help from another person to put on and taking off regular lower body clothing?: A Little 6 Click Score: 19   End of Session Nurse Communication: Other (comment) (cognition)  Activity Tolerance: Patient tolerated treatment well Patient left: in  chair;with call bell/phone within reach;with chair alarm set  OT Visit Diagnosis: Unsteadiness on feet (R26.81);Other symptoms and signs involving cognitive function                Time: 9604-5409 OT Time Calculation (min): 26 min Charges:  OT General Charges $OT Visit: 1 Visit OT Evaluation $OT Eval Low Complexity: 1 Low OT Treatments $Self Care/Home Management : 8-22 mins  Berna Spare, OTR/L Acute Rehabilitation Services Office: [phone]   Evern Bio 01/06/2023, 11:06 AM

## 2023-01-06 NOTE — Progress Notes (Signed)
Cardiology f/u scheduled 8/6 per d/w Dr. Tenny Craw, appt info on AVS.

## 2023-01-06 NOTE — Progress Notes (Signed)
Mobility Specialist Progress Note:   01/06/23 1218  Mobility  Activity Ambulated with assistance in hallway  Level of Assistance Contact guard assist, steadying assist  Assistive Device None  Distance Ambulated (ft) 400 ft  Activity Response Tolerated well  Mobility Referral Yes  $Mobility charge 1 Mobility  Mobility Specialist Start Time (ACUTE ONLY) 1149  Mobility Specialist Stop Time (ACUTE ONLY) 1214  Mobility Specialist Time Calculation (min) (ACUTE ONLY) 25 min    Pre Mobility: 66 HR,  151/93 (112) BP,  96% SpO2 During Mobility: 82 HR,  93% SpO2 Post Mobility:  55 HR,  98% SpO2  Pt received in chair, eager to mobility.  Mod I for STS. Ambulated in hallway w/ contact guard, no AD. C/o of some fatigue, otherwise asymptomatic. VSS throughout. Pt left in chair with call bell and chair alarm on .  [facility]'Vante Earlene Plater Mobility Specialist Please contact via Special educational needs teacher or Rehab office at [phone]

## 2023-01-06 NOTE — Progress Notes (Signed)
PROGRESS NOTE    JURON VORHEES  GMW:[id number] DOB: 09/25/1925 DOA: 01/04/2023 PCP: Corwin Levins, MD   Brief Narrative: This 89 y.o. male with medical history significant of TIA, hyperlipidemia, hypothyroidism, COPD, constipation, bradycardia, bladder outlet obstruction, scoliosis, anxiety, low back pain presenting after a fall.   Patient had been feeling well for the past couple days with some lethargy and fevers. PTA he stepped backwards and tried to lean into a closet door which he thought was closed and fell into the closet as it was open.  Unclear if he hit his head or lost consciousness, there was some swelling noted by EMS.  He currently is on warfarin for atrial fibrillation.  Suspicion for aspiration while he was on the floor as he was found in his vomit by EMS who thought he was there for at least several minutes if not longer.  EMS did show 1 episode of bradycardia into the 30s and route.  Initially placed on nonrebreather but able to be weaned to 4 L supplemental oxygen. He's been found to have covid.  Currently on dexamethasone and remdesivir.   Of note, wife lives in the same facility as patient and has dementia.   Assessment & Plan:   Principal Problem:   Acute respiratory failure with hypoxia (HCC) Active Problems:   COPD (chronic obstructive pulmonary disease) (HCC)   Hyperlipidemia   Hypothyroidism   Bladder outlet obstruction   History of TIA (transient ischemic attack)   Anxiety   COVID-19 virus infection   Hypotension   COVID  Acute Hypoxic respiratory failure: >  Multifactorial  Aspiration Pneumonia, Covid 19 Virus Infection: - He is currently requiring 1.5 L [place] - CT chest with ground glass airspace opacities throughout the right lung base (aspiration vs infection)  - concern for aspiration  - Continue dexamethasone, remdesivir - Continue empiric abx to cover possible bacterial pneumonia - covid 19 positive - strict I/O, daily weights. -Continue  supplemental oxygen and wean as tolerated.   Bradycardia: Rates ranging from 30's-50's Appreciate cardiology assistance. -Avoid AV nodal blocking meds. No need for PM   Fall: Suspect this is related to generalized weakness with covid 19 infection PT/OT CT CTL spine without fracture . Soft tissue contusion of R forehead   History of TIA Hyperlipidemia - Continue home aspirin, rosuvastatin.   Hypothyroidism - Continue home Synthroid. - TSH wnl   Bladder outlet obstruction - Continue alfuzosin    History of COPD > Listed in chart but not on any medications   Thrombocytopenia > Platelets currently 71 down from baseline of 90-130. - Will hold off on chemoprophylaxis for DVT - related to covid infection?   Constipation - episode of incontinence today, overflow?   - likely needs bowel regimen (hold for now with incontinence)   DVT prophylaxis:SCDs Code Status: Full code Family Communication:No family at bed side. Disposition Plan:    Status is: Inpatient Remains inpatient appropriate because:  Need for inpatient care   Consultants:   Cardiology  Procedures: None  Antimicrobials:  Anti-infectives (From admission, onward)    Start     Dose/Rate Route Frequency Ordered Stop   01/06/23 1400  azithromycin (ZITHROMAX) tablet 500 mg        500 mg Oral Daily 01/06/23 1224 01/10/23 0959   01/05/23 2000  azithromycin (ZITHROMAX) 500 mg in sodium chloride 0.9 % 250 mL IVPB  Status:  Discontinued        500 mg 250 mL/hr over 60 Minutes Intravenous Every 24  hours 01/04/23 1655 01/06/23 1223   01/05/23 1000  remdesivir 100 mg in sodium chloride 0.9 % 100 mL IVPB       Placed in "Followed by" Linked Group   100 mg 200 mL/hr over 30 Minutes Intravenous Daily 01/04/23 1640 01/07/23 0959   01/05/23 0000  cefTRIAXone (ROCEPHIN) 2 g in sodium chloride 0.9 % 100 mL IVPB        2 g 200 mL/hr over 30 Minutes Intravenous Every 24 hours 01/04/23 1655     01/04/23 2000  remdesivir 200  mg in sodium chloride 0.9% 250 mL IVPB       Placed in "Followed by" Linked Group   200 mg 580 mL/hr over 30 Minutes Intravenous Once 01/04/23 1640 01/04/23 2243   01/04/23 1200  vancomycin (VANCOREADY) IVPB 1250 mg/250 mL        1,250 mg 166.7 mL/hr over 90 Minutes Intravenous  Once 01/04/23 1148 01/04/23 2109   01/04/23 1145  ceFEPIme (MAXIPIME) 2 g in sodium chloride 0.9 % 100 mL IVPB        2 g 200 mL/hr over 30 Minutes Intravenous  Once 01/04/23 1136 01/04/23 1302   01/04/23 1145  metroNIDAZOLE (FLAGYL) IVPB 500 mg        500 mg 100 mL/hr over 60 Minutes Intravenous  Once 01/04/23 1136 01/04/23 1437   01/04/23 1145  vancomycin (VANCOCIN) IVPB 1000 mg/200 mL premix  Status:  Discontinued        1,000 mg 200 mL/hr over 60 Minutes Intravenous  Once 01/04/23 1136 01/04/23 1148      Subjective: Patient was seen and examined at bed side, overnight events noted,  Patient reports doing better but reports feeling very weak and tired, He remains on 1.5 L O2   Objective: Vitals:   01/05/23 2011 01/06/23 0115 01/06/23 0404 01/06/23 0810  BP: 134/75 (!) 130/56 124/72 (!) 151/92  Pulse: 69 (!) 47 (!) 55 (!) 51  Resp: (!) 25 (!) 24 (!) 24 (!) 30  Temp: 98.3 F (36.8 C) 98.3 F (36.8 C) 98.3 F (36.8 C) (!) 97.4 F (36.3 C)  TempSrc: Oral Oral Oral Oral  SpO2: 93% 93% 91% (!) 89%  Weight:      Height:        Intake/Output Summary (Last 24 hours) at 01/06/2023 1240 Last data filed at 01/06/2023 0800 Gross per 24 hour  Intake 930 ml  Output 750 ml  Net 180 ml   Filed Weights   01/04/23 2310  Weight: 59.5 kg    Examination:  General exam: Appears calm and comfortable, deconditioned, NAD.  Respiratory system: Clear to auscultation. Respiratory effort normal. RR 21 Cardiovascular system: S1 & S2 heard, RRR.No murmer. No pedal edema. Gastrointestinal system: Abdomen is nondistended, soft and nontender.Normal bowel sounds heard. Central nervous system: Alert and oriented x 3. No  focal neurological deficits. Extremities: Symmetric 5 x 5 power. Skin: No rashes, lesions or ulcers Psychiatry: Judgement and insight appear normal. Mood & affect appropriate.     Data Reviewed: I have personally reviewed following labs and imaging studies  CBC: Recent Labs  Lab 01/04/23 1130 01/04/23 1139 01/05/23 0714  WBC 3.5*  --  7.5  HGB 13.4 13.3 13.3  HCT 40.7 39.0 39.9  MCV 91.9  --  93.9  PLT 71*  --  76*   Basic Metabolic Panel: Recent Labs  Lab 01/04/23 1130 01/04/23 1139 01/05/23 0714  NA 136 137 133*  K 3.8 3.7 3.3*  CL 100  100 102  CO2 23  --  24  GLUCOSE 131* 127* 123*  BUN 18 20 14   CREATININE 1.00 0.90 0.93  CALCIUM 8.5*  --  7.6*   GFR: Estimated Creatinine Clearance: 40.9 mL/min (by C-G formula based on SCr of 0.93 mg/dL). Liver Function Tests: Recent Labs  Lab 01/04/23 1130 01/05/23 0714  AST 27 25  ALT 19 17  ALKPHOS 65 49  BILITOT 0.6 0.4  PROT 5.9* 4.9*  ALBUMIN 3.6 2.7*   No results for input(s): "LIPASE", "AMYLASE" in the last 168 hours. No results for input(s): "AMMONIA" in the last 168 hours. Coagulation Profile: Recent Labs  Lab 01/04/23 1130  INR 1.1   Cardiac Enzymes: Recent Labs  Lab 01/04/23 1130  CKTOTAL 107   BNP (last 3 results) No results for input(s): "PROBNP" in the last 8760 hours. HbA1C: No results for input(s): "HGBA1C" in the last 72 hours. CBG: No results for input(s): "GLUCAP" in the last 168 hours. Lipid Profile: No results for input(s): "CHOL", "HDL", "LDLCALC", "TRIG", "CHOLHDL", "LDLDIRECT" in the last 72 hours. Thyroid Function Tests: Recent Labs    01/05/23 0708  TSH 1.273   Anemia Panel: No results for input(s): "VITAMINB12", "FOLATE", "FERRITIN", "TIBC", "IRON", "RETICCTPCT" in the last 72 hours. Sepsis Labs: Recent Labs  Lab 01/04/23 1140 01/05/23 0714  PROCALCITON  --  0.16  LATICACIDVEN 1.4  --     Recent Results (from the past 240 hour(s))  Resp panel by RT-PCR (RSV, Flu  A&B, Covid) Anterior Nasal Swab     Status: Abnormal   Collection Time: 01/04/23 11:20 AM   Specimen: Anterior Nasal Swab  Result Value Ref Range Status   SARS Coronavirus 2 by RT PCR POSITIVE (A) NEGATIVE Final   Influenza A by PCR NEGATIVE NEGATIVE Final   Influenza B by PCR NEGATIVE NEGATIVE Final    Comment: (NOTE) The Xpert Xpress SARS-CoV-2/FLU/RSV plus assay is intended as an aid in the diagnosis of influenza from Nasopharyngeal swab specimens and should not be used as a sole basis for treatment. Nasal washings and aspirates are unacceptable for Xpert Xpress SARS-CoV-2/FLU/RSV testing.  Fact Sheet for Patients: BloggerCourse.com  Fact Sheet for Healthcare Providers: SeriousBroker.it  This test is not yet approved or cleared by the [place] FDA and has been authorized for detection and/or diagnosis of SARS-CoV-2 by FDA under an Emergency Use Authorization (EUA). This EUA will remain in effect (meaning this test can be used) for the duration of the COVID-19 declaration under Section 564(b)(1) of the Act, 21 U.S.C. section 360bbb-3(b)(1), unless the authorization is terminated or revoked.     Resp Syncytial Virus by PCR NEGATIVE NEGATIVE Final    Comment: (NOTE) Fact Sheet for Patients: BloggerCourse.com  Fact Sheet for Healthcare Providers: SeriousBroker.it  This test is not yet approved or cleared by the [place] FDA and has been authorized for detection and/or diagnosis of SARS-CoV-2 by FDA under an Emergency Use Authorization (EUA). This EUA will remain in effect (meaning this test can be used) for the duration of the COVID-19 declaration under Section 564(b)(1) of the Act, 21 U.S.C. section 360bbb-3(b)(1), unless the authorization is terminated or revoked.  Performed at [facility], [address] [place], [place] [address]   Blood Culture  (routine x 2)     Status: None (Preliminary result)   Collection Time: 01/04/23 11:30 AM   Specimen: BLOOD  Result Value Ref Range Status   Specimen Description BLOOD RIGHT ANTECUBITAL  Final  Special Requests   Final    BOTTLES DRAWN AEROBIC AND ANAEROBIC Blood Culture results may not be optimal due to an excessive volume of blood received in culture bottles   Culture   Final    NO GROWTH 2 DAYS Performed at [facility], [address] [place], [place] [address]    Report Status PENDING  Incomplete  Blood Culture (routine x 2)     Status: None (Preliminary result)   Collection Time: 01/04/23 11:47 AM   Specimen: BLOOD  Result Value Ref Range Status   Specimen Description BLOOD SITE NOT SPECIFIED  Final   Special Requests   Final    BOTTLES DRAWN AEROBIC AND ANAEROBIC Blood Culture results may not be optimal due to an inadequate volume of blood received in culture bottles   Culture   Final    NO GROWTH 2 DAYS Performed at [facility], [address] [place], [place] [address]    Report Status PENDING  Incomplete     Radiology Studies: CT HEAD WO CONTRAST  Result Date: 01/04/2023 CLINICAL DATA:  Fall EXAM: CT HEAD WITHOUT CONTRAST CT CERVICAL SPINE WITHOUT CONTRAST TECHNIQUE: Multidetector CT imaging of the head and cervical spine was performed following the standard protocol without intravenous contrast. Multiplanar CT image reconstructions of the cervical spine were also generated. RADIATION DOSE REDUCTION: This exam was performed according to the departmental dose-optimization program which includes automated exposure control, adjustment of the mA and/or kV according to patient size and/or use of iterative reconstruction technique. COMPARISON:  06/23/2021 FINDINGS: CT HEAD FINDINGS Brain: No evidence of acute infarction, hemorrhage, hydrocephalus, extra-axial collection or mass lesion/mass effect. Periventricular and deep white matter hypodensity. Vascular: No  hyperdense vessel or unexpected calcification. Skull: Normal. Negative for fracture or focal lesion. Sinuses/Orbits: No acute finding. Other: Soft tissue contusion of the right forehead (series 3, image 22). CT CERVICAL SPINE FINDINGS Alignment: Normal. Skull base and vertebrae: No acute fracture. No primary bone lesion or focal pathologic process. Soft tissues and spinal canal: No prevertebral fluid or swelling. No visible canal hematoma. Disc levels: Mild-to-moderate disc space height loss and osteophytosis, worst at C3-C4. Upper chest: Negative. Other: None. IMPRESSION: 1. No acute intracranial pathology. Small-vessel white matter disease. 2. Soft tissue contusion of the right forehead. 3. No fracture or static subluxation of the cervical spine. 4. Mild-to-moderate cervical disc degenerative disease. Electronically Signed   By: Jearld Lesch M.D.   On: 01/04/2023 13:29   CT CERVICAL SPINE WO CONTRAST  Result Date: 01/04/2023 CLINICAL DATA:  Fall EXAM: CT HEAD WITHOUT CONTRAST CT CERVICAL SPINE WITHOUT CONTRAST TECHNIQUE: Multidetector CT imaging of the head and cervical spine was performed following the standard protocol without intravenous contrast. Multiplanar CT image reconstructions of the cervical spine were also generated. RADIATION DOSE REDUCTION: This exam was performed according to the departmental dose-optimization program which includes automated exposure control, adjustment of the mA and/or kV according to patient size and/or use of iterative reconstruction technique. COMPARISON:  06/23/2021 FINDINGS: CT HEAD FINDINGS Brain: No evidence of acute infarction, hemorrhage, hydrocephalus, extra-axial collection or mass lesion/mass effect. Periventricular and deep white matter hypodensity. Vascular: No hyperdense vessel or unexpected calcification. Skull: Normal. Negative for fracture or focal lesion. Sinuses/Orbits: No acute finding. Other: Soft tissue contusion of the right forehead (series 3, image  22). CT CERVICAL SPINE FINDINGS Alignment: Normal. Skull base and vertebrae: No acute fracture. No primary bone lesion or focal pathologic process. Soft tissues and spinal canal: No prevertebral  fluid or swelling. No visible canal hematoma. Disc levels: Mild-to-moderate disc space height loss and osteophytosis, worst at C3-C4. Upper chest: Negative. Other: None. IMPRESSION: 1. No acute intracranial pathology. Small-vessel white matter disease. 2. Soft tissue contusion of the right forehead. 3. No fracture or static subluxation of the cervical spine. 4. Mild-to-moderate cervical disc degenerative disease. Electronically Signed   By: Jearld Lesch M.D.   On: 01/04/2023 13:29   CT CHEST ABDOMEN PELVIS WO CONTRAST  Result Date: 01/04/2023 CLINICAL DATA:  Fall EXAM: CT CHEST, ABDOMEN, AND PELVIS WITHOUT CONTRAST CT THORACIC AND LUMBAR SPINE WITHOUT CONTRAST TECHNIQUE: Multidetector CT imaging of the chest, abdomen and pelvis was performed following the standard protocol without administration of intravenous contrast. Multidetector CT imaging of the thoracic and lumbar spine was performed following the standard protocol without administration of intravenous contrast. RADIATION DOSE REDUCTION: This exam was performed according to the departmental dose-optimization program which includes automated exposure control, adjustment of the mA and/or kV according to patient size and/or use of iterative reconstruction technique. COMPARISON:  None Available. FINDINGS: CT CHEST FINDINGS Cardiovascular: Aortic atherosclerosis. Normal heart size. Left coronary artery calcifications. No pericardial effusion. Mediastinum/Nodes: No enlarged mediastinal, hilar, or axillary lymph nodes. Thyroid gland, trachea, and esophagus demonstrate no significant findings. Lungs/Pleura: Scattered ground-glass airspace opacities throughout the right lung base (series 4, image 69). No pleural effusion or pneumothorax. Musculoskeletal: No chest wall  mass or suspicious osseous lesions identified. CT ABDOMEN PELVIS FINDINGS Hepatobiliary: No solid liver abnormality is seen. No gallstones, gallbladder wall thickening, or biliary dilatation. Pancreas: Unremarkable. No pancreatic ductal dilatation or surrounding inflammatory changes. Spleen: Normal in size without significant abnormality. Adrenals/Urinary Tract: Adrenal glands are unremarkable. Kidneys are normal, without renal calculi, solid lesion, or hydronephrosis. Bladder is unremarkable. Stomach/Bowel: Stomach is within normal limits. Appendix appears normal. No evidence of bowel wall thickening, distention, or inflammatory changes. Descending and sigmoid diverticulosis. Large burden of stool throughout the colon and rectum. Vascular/Lymphatic: Aortic atherosclerosis. No enlarged abdominal or pelvic lymph nodes. Reproductive: Prostatomegaly. Other: Small, fat containing midline epigastric hernia (series 3, image 83). No ascites. Musculoskeletal: No acute osseous findings. CT THORACIC AND LUMBAR SPINE FINDINGS Alignment: Gentle dextroscoliosis of the thoracic spine, with otherwise normal thoracic kyphosis. Normal lumbar lordosis. Vertebral bodies: Intact. No fracture or dislocation. Disc spaces: Mild disc space height loss and osteophytosis throughout the thoracic spine. Mild disc degenerative change L1-L2 with otherwise intact lumbar disc spaces. Mild facet degenerative change of the lower lumbar levels. Paraspinous soft tissues: Unremarkable. IMPRESSION: 1. Scattered ground-glass airspace opacities throughout the right lung base, consistent with infection or aspiration. 2. No noncontrast CT evidence of acute traumatic injury to the chest, abdomen, or pelvis. 3. No fracture or dislocation of the thoracic or lumbar spine. 4. Descending and sigmoid diverticulosis. 5. Large burden of stool throughout the colon and rectum. 6. Prostatomegaly. 7. Coronary artery disease. Aortic Atherosclerosis (ICD10-I70.0).  Electronically Signed   By: Jearld Lesch M.D.   On: 01/04/2023 13:23   CT T-SPINE NO CHARGE  Result Date: 01/04/2023 CLINICAL DATA:  Fall EXAM: CT CHEST, ABDOMEN, AND PELVIS WITHOUT CONTRAST CT THORACIC AND LUMBAR SPINE WITHOUT CONTRAST TECHNIQUE: Multidetector CT imaging of the chest, abdomen and pelvis was performed following the standard protocol without administration of intravenous contrast. Multidetector CT imaging of the thoracic and lumbar spine was performed following the standard protocol without administration of intravenous contrast. RADIATION DOSE REDUCTION: This exam was performed according to the departmental dose-optimization program which includes automated exposure control, adjustment of  the mA and/or kV according to patient size and/or use of iterative reconstruction technique. COMPARISON:  None Available. FINDINGS: CT CHEST FINDINGS Cardiovascular: Aortic atherosclerosis. Normal heart size. Left coronary artery calcifications. No pericardial effusion. Mediastinum/Nodes: No enlarged mediastinal, hilar, or axillary lymph nodes. Thyroid gland, trachea, and esophagus demonstrate no significant findings. Lungs/Pleura: Scattered ground-glass airspace opacities throughout the right lung base (series 4, image 69). No pleural effusion or pneumothorax. Musculoskeletal: No chest wall mass or suspicious osseous lesions identified. CT ABDOMEN PELVIS FINDINGS Hepatobiliary: No solid liver abnormality is seen. No gallstones, gallbladder wall thickening, or biliary dilatation. Pancreas: Unremarkable. No pancreatic ductal dilatation or surrounding inflammatory changes. Spleen: Normal in size without significant abnormality. Adrenals/Urinary Tract: Adrenal glands are unremarkable. Kidneys are normal, without renal calculi, solid lesion, or hydronephrosis. Bladder is unremarkable. Stomach/Bowel: Stomach is within normal limits. Appendix appears normal. No evidence of bowel wall thickening, distention, or  inflammatory changes. Descending and sigmoid diverticulosis. Large burden of stool throughout the colon and rectum. Vascular/Lymphatic: Aortic atherosclerosis. No enlarged abdominal or pelvic lymph nodes. Reproductive: Prostatomegaly. Other: Small, fat containing midline epigastric hernia (series 3, image 83). No ascites. Musculoskeletal: No acute osseous findings. CT THORACIC AND LUMBAR SPINE FINDINGS Alignment: Gentle dextroscoliosis of the thoracic spine, with otherwise normal thoracic kyphosis. Normal lumbar lordosis. Vertebral bodies: Intact. No fracture or dislocation. Disc spaces: Mild disc space height loss and osteophytosis throughout the thoracic spine. Mild disc degenerative change L1-L2 with otherwise intact lumbar disc spaces. Mild facet degenerative change of the lower lumbar levels. Paraspinous soft tissues: Unremarkable. IMPRESSION: 1. Scattered ground-glass airspace opacities throughout the right lung base, consistent with infection or aspiration. 2. No noncontrast CT evidence of acute traumatic injury to the chest, abdomen, or pelvis. 3. No fracture or dislocation of the thoracic or lumbar spine. 4. Descending and sigmoid diverticulosis. 5. Large burden of stool throughout the colon and rectum. 6. Prostatomegaly. 7. Coronary artery disease. Aortic Atherosclerosis (ICD10-I70.0). Electronically Signed   By: Jearld Lesch M.D.   On: 01/04/2023 13:23   CT L-SPINE NO CHARGE  Result Date: 01/04/2023 CLINICAL DATA:  Fall EXAM: CT CHEST, ABDOMEN, AND PELVIS WITHOUT CONTRAST CT THORACIC AND LUMBAR SPINE WITHOUT CONTRAST TECHNIQUE: Multidetector CT imaging of the chest, abdomen and pelvis was performed following the standard protocol without administration of intravenous contrast. Multidetector CT imaging of the thoracic and lumbar spine was performed following the standard protocol without administration of intravenous contrast. RADIATION DOSE REDUCTION: This exam was performed according to the  departmental dose-optimization program which includes automated exposure control, adjustment of the mA and/or kV according to patient size and/or use of iterative reconstruction technique. COMPARISON:  None Available. FINDINGS: CT CHEST FINDINGS Cardiovascular: Aortic atherosclerosis. Normal heart size. Left coronary artery calcifications. No pericardial effusion. Mediastinum/Nodes: No enlarged mediastinal, hilar, or axillary lymph nodes. Thyroid gland, trachea, and esophagus demonstrate no significant findings. Lungs/Pleura: Scattered ground-glass airspace opacities throughout the right lung base (series 4, image 69). No pleural effusion or pneumothorax. Musculoskeletal: No chest wall mass or suspicious osseous lesions identified. CT ABDOMEN PELVIS FINDINGS Hepatobiliary: No solid liver abnormality is seen. No gallstones, gallbladder wall thickening, or biliary dilatation. Pancreas: Unremarkable. No pancreatic ductal dilatation or surrounding inflammatory changes. Spleen: Normal in size without significant abnormality. Adrenals/Urinary Tract: Adrenal glands are unremarkable. Kidneys are normal, without renal calculi, solid lesion, or hydronephrosis. Bladder is unremarkable. Stomach/Bowel: Stomach is within normal limits. Appendix appears normal. No evidence of bowel wall thickening, distention, or inflammatory changes. Descending and sigmoid diverticulosis. Large burden of stool  throughout the colon and rectum. Vascular/Lymphatic: Aortic atherosclerosis. No enlarged abdominal or pelvic lymph nodes. Reproductive: Prostatomegaly. Other: Small, fat containing midline epigastric hernia (series 3, image 83). No ascites. Musculoskeletal: No acute osseous findings. CT THORACIC AND LUMBAR SPINE FINDINGS Alignment: Gentle dextroscoliosis of the thoracic spine, with otherwise normal thoracic kyphosis. Normal lumbar lordosis. Vertebral bodies: Intact. No fracture or dislocation. Disc spaces: Mild disc space height loss and  osteophytosis throughout the thoracic spine. Mild disc degenerative change L1-L2 with otherwise intact lumbar disc spaces. Mild facet degenerative change of the lower lumbar levels. Paraspinous soft tissues: Unremarkable. IMPRESSION: 1. Scattered ground-glass airspace opacities throughout the right lung base, consistent with infection or aspiration. 2. No noncontrast CT evidence of acute traumatic injury to the chest, abdomen, or pelvis. 3. No fracture or dislocation of the thoracic or lumbar spine. 4. Descending and sigmoid diverticulosis. 5. Large burden of stool throughout the colon and rectum. 6. Prostatomegaly. 7. Coronary artery disease. Aortic Atherosclerosis (ICD10-I70.0). Electronically Signed   By: Jearld Lesch M.D.   On: 01/04/2023 13:23     Scheduled Meds:  alfuzosin  10 mg Oral Q breakfast   azithromycin  500 mg Oral Daily   dexamethasone (DECADRON) injection  6 mg Intravenous Q24H   levothyroxine  50 mcg Oral QAC breakfast   potassium chloride  40 mEq Oral Once   rosuvastatin  10 mg Oral Daily   sodium chloride flush  3 mL Intravenous Q12H   thiamine  50 mg Oral Daily   Continuous Infusions:  cefTRIAXone (ROCEPHIN)  IV Stopped (01/06/23 0010)   remdesivir 100 mg in sodium chloride 0.9 % 100 mL IVPB 100 mg (01/05/23 1038)     LOS: 1 day    Time spent: 50 mins    Willeen Niece, MD [facility]   If 7PM-7AM, please contact night-coverage

## 2023-01-07 DIAGNOSIS — J9601 Acute respiratory failure with hypoxia: Secondary | ICD-10-CM | POA: Diagnosis not present

## 2023-01-07 LAB — CULTURE, BLOOD (ROUTINE X 2)

## 2023-01-07 NOTE — Progress Notes (Signed)
Since pt has COVID and PCT is low, ok to stop abx per Dr Idelle Leech.  Ulyses Southward, PharmD, BCIDP, AAHIVP, CPP Infectious Disease Pharmacist 01/07/2023 8:57 AM

## 2023-01-07 NOTE — Progress Notes (Signed)
PROGRESS NOTE    BUCKLEY BRADLY  HQI:[id number] DOB: June 19, 1926 DOA: 01/04/2023 PCP: Corwin Levins, MD   Brief Narrative: This 89 y.o. male with medical history significant of TIA, hyperlipidemia, hypothyroidism, COPD, constipation, bradycardia, bladder outlet obstruction, scoliosis, anxiety, low back pain presenting after a fall.   Patient had been feeling well for the past couple days with some lethargy and fevers. PTA he stepped backwards and tried to lean into a closet door which he thought was closed and fell into the closet as it was open.  Unclear if he hit his head or lost consciousness, there was some swelling noted by EMS.  He currently is on warfarin for atrial fibrillation.  Suspicion for aspiration while he was on the floor as he was found in his vomit by EMS who thought he was there for at least several minutes if not longer.  EMS did show 1 episode of bradycardia into the 30s and route.  Initially placed on nonrebreather but able to be weaned to 4 L supplemental oxygen. He's been found to have covid.  Currently on dexamethasone and remdesivir.   Of note, wife lives in the same facility as patient and has dementia.   Assessment & Plan:   Principal Problem:   Acute respiratory failure with hypoxia (HCC) Active Problems:   COPD (chronic obstructive pulmonary disease) (HCC)   Hyperlipidemia   Hypothyroidism   Bladder outlet obstruction   History of TIA (transient ischemic attack)   Anxiety   COVID-19 virus infection   Hypotension   COVID  Acute Hypoxic respiratory failure: >  Multifactorial  Aspiration Pneumonia, Covid 19 Virus Infection: - He is currently weaned down to room air. - CT chest with ground glass airspace opacities throughout the right lung base (aspiration vs infection)  - concern for aspiration  - Continue dexamethasone, remdesivir - Empirically started on antibiotics for bacterial pneumonia - covid 19 positive - strict I/O, daily weights. -Continue  supplemental oxygen and wean as tolerated. -Successfully weaned down to room air. -Antibiotics discontinued.   Bradycardia: Rates ranging from 30's-50's Appreciate cardiology assistance. -Avoid AV nodal blocking meds. No need for PM. -HR now improved.   Fall: Suspect this is related to generalized weakness with covid 19 infection. PT/OT CT CTL spine without fracture . Soft tissue contusion of R forehead   History of TIA Hyperlipidemia - Continue home aspirin, rosuvastatin.   Hypothyroidism - Continue home Synthroid. - TSH wnl   Bladder outlet obstruction - Continue alfuzosin    History of COPD > Listed in chart but not on any medications   Thrombocytopenia > Platelets currently 71 down from baseline of 90-130. - Will hold off on chemoprophylaxis for DVT - related to covid infection?   Constipation - episode of incontinence today, overflow?   - likely needs bowel regimen (hold for now with incontinence)   DVT prophylaxis:SCDs Code Status: Full code Family Communication: No family at bed side. Disposition Plan:    Status is: Inpatient Remains inpatient appropriate because:  Need for inpatient care Anticipated discharge home tomorrow   Consultants:   Cardiology  Procedures: None  Antimicrobials:  Anti-infectives (From admission, onward)    Start     Dose/Rate Route Frequency Ordered Stop   01/06/23 1400  azithromycin (ZITHROMAX) tablet 500 mg  Status:  Discontinued        500 mg Oral Daily 01/06/23 1224 01/07/23 0857   01/05/23 2000  azithromycin (ZITHROMAX) 500 mg in sodium chloride 0.9 % 250 mL IVPB  Status:  Discontinued        500 mg 250 mL/hr over 60 Minutes Intravenous Every 24 hours 01/04/23 1655 01/06/23 1223   01/05/23 1000  remdesivir 100 mg in sodium chloride 0.9 % 100 mL IVPB       Placed in "Followed by" Linked Group   100 mg 200 mL/hr over 30 Minutes Intravenous Daily 01/04/23 1640 01/06/23 1325   01/05/23 0000  cefTRIAXone (ROCEPHIN) 2 g  in sodium chloride 0.9 % 100 mL IVPB  Status:  Discontinued        2 g 200 mL/hr over 30 Minutes Intravenous Every 24 hours 01/04/23 1655 01/07/23 0857   01/04/23 2000  remdesivir 200 mg in sodium chloride 0.9% 250 mL IVPB       Placed in "Followed by" Linked Group   200 mg 580 mL/hr over 30 Minutes Intravenous Once 01/04/23 1640 01/04/23 2243   01/04/23 1200  vancomycin (VANCOREADY) IVPB 1250 mg/250 mL        1,250 mg 166.7 mL/hr over 90 Minutes Intravenous  Once 01/04/23 1148 01/04/23 2109   01/04/23 1145  ceFEPIme (MAXIPIME) 2 g in sodium chloride 0.9 % 100 mL IVPB        2 g 200 mL/hr over 30 Minutes Intravenous  Once 01/04/23 1136 01/04/23 1302   01/04/23 1145  metroNIDAZOLE (FLAGYL) IVPB 500 mg        500 mg 100 mL/hr over 60 Minutes Intravenous  Once 01/04/23 1136 01/04/23 1437   01/04/23 1145  vancomycin (VANCOCIN) IVPB 1000 mg/200 mL premix  Status:  Discontinued        1,000 mg 200 mL/hr over 60 Minutes Intravenous  Once 01/04/23 1136 01/04/23 1148      Subjective: Patient was seen and examined at bed side, overnight events noted. Patient reports doing better,  he still reports feeling very weak and unsteady while walking.   He seems very deconditioned, he is weaned down to room air. He gets short of breath while walking   Objective: Vitals:   01/06/23 2017 01/07/23 0000 01/07/23 0313 01/07/23 0900  BP: 128/88 (!) 130/55 131/76 125/89  Pulse: 66 (!) 53 (!) 46 96  Resp: 19 20 (!) 21 (!) 21  Temp: 97.6 F (36.4 C) (!) 97.5 F (36.4 C) 97.6 F (36.4 C) 97.7 F (36.5 C)  TempSrc: Oral Oral Oral Oral  SpO2: 96% 93% 95% 98%  Weight:      Height:        Intake/Output Summary (Last 24 hours) at 01/07/2023 1312 Last data filed at 01/07/2023 0758 Gross per 24 hour  Intake 353.87 ml  Output 1700 ml  Net -1346.13 ml   Filed Weights   01/04/23 2310  Weight: 59.5 kg    Examination:  General exam: Appears calm and comfortable, deconditioned. Sick  looking Respiratory system: Decreased breath sounds.,. Respiratory effort normal. RR 22 Cardiovascular system: S1 & S2 heard, RRR.No murmer. No pedal edema. Gastrointestinal system: Abdomen is nondistended, soft and nontender.Normal bowel sounds heard. Central nervous system: Alert and oriented x 3. No focal neurological deficits. Extremities: Symmetric 5 x 5 power. Skin: No rashes, lesions or ulcers Psychiatry: Judgement and insight appear normal. Mood & affect appropriate.     Data Reviewed: I have personally reviewed following labs and imaging studies  CBC: Recent Labs  Lab 01/04/23 1130 01/04/23 1139 01/05/23 0714 01/06/23 1252  WBC 3.5*  --  7.5 5.3  NEUTROABS  --   --   --  3.6  HGB 13.4 13.3 13.3 14.4  HCT 40.7 39.0 39.9 43.2  MCV 91.9  --  93.9 90.4  PLT 71*  --  76* 95*   Basic Metabolic Panel: Recent Labs  Lab 01/04/23 1130 01/04/23 1139 01/05/23 0714 01/06/23 1252  NA 136 137 133* 137  K 3.8 3.7 3.3* 3.7  CL 100 100 102 105  CO2 23  --  24 25  GLUCOSE 131* 127* 123* 104*  BUN 18 20 14 18   CREATININE 1.00 0.90 0.93 0.86  CALCIUM 8.5*  --  7.6* 8.1*  MG  --   --   --  1.8  PHOS  --   --   --  2.0*   GFR: Estimated Creatinine Clearance: 44.2 mL/min (by C-G formula based on SCr of 0.86 mg/dL). Liver Function Tests: Recent Labs  Lab 01/04/23 1130 01/05/23 0714 01/06/23 1252  AST 27 25 29   ALT 19 17 21   ALKPHOS 65 49 52  BILITOT 0.6 0.4 0.4  PROT 5.9* 4.9* 5.4*  ALBUMIN 3.6 2.7* 3.0*   No results for input(s): "LIPASE", "AMYLASE" in the last 168 hours. No results for input(s): "AMMONIA" in the last 168 hours. Coagulation Profile: Recent Labs  Lab 01/04/23 1130  INR 1.1   Cardiac Enzymes: Recent Labs  Lab 01/04/23 1130  CKTOTAL 107   BNP (last 3 results) No results for input(s): "PROBNP" in the last 8760 hours. HbA1C: No results for input(s): "HGBA1C" in the last 72 hours. CBG: No results for input(s): "GLUCAP" in the last 168  hours. Lipid Profile: No results for input(s): "CHOL", "HDL", "LDLCALC", "TRIG", "CHOLHDL", "LDLDIRECT" in the last 72 hours. Thyroid Function Tests: Recent Labs    01/05/23 0708  TSH 1.273   Anemia Panel: No results for input(s): "VITAMINB12", "FOLATE", "FERRITIN", "TIBC", "IRON", "RETICCTPCT" in the last 72 hours. Sepsis Labs: Recent Labs  Lab 01/04/23 1140 01/05/23 0714  PROCALCITON  --  0.16  LATICACIDVEN 1.4  --     Recent Results (from the past 240 hour(s))  Resp panel by RT-PCR (RSV, Flu A&B, Covid) Anterior Nasal Swab     Status: Abnormal   Collection Time: 01/04/23 11:20 AM   Specimen: Anterior Nasal Swab  Result Value Ref Range Status   SARS Coronavirus 2 by RT PCR POSITIVE (A) NEGATIVE Final   Influenza A by PCR NEGATIVE NEGATIVE Final   Influenza B by PCR NEGATIVE NEGATIVE Final    Comment: (NOTE) The Xpert Xpress SARS-CoV-2/FLU/RSV plus assay is intended as an aid in the diagnosis of influenza from Nasopharyngeal swab specimens and should not be used as a sole basis for treatment. Nasal washings and aspirates are unacceptable for Xpert Xpress SARS-CoV-2/FLU/RSV testing.  Fact Sheet for Patients: BloggerCourse.com  Fact Sheet for Healthcare Providers: SeriousBroker.it  This test is not yet approved or cleared by the [place] FDA and has been authorized for detection and/or diagnosis of SARS-CoV-2 by FDA under an Emergency Use Authorization (EUA). This EUA will remain in effect (meaning this test can be used) for the duration of the COVID-19 declaration under Section 564(b)(1) of the Act, 21 U.S.C. section 360bbb-3(b)(1), unless the authorization is terminated or revoked.     Resp Syncytial Virus by PCR NEGATIVE NEGATIVE Final    Comment: (NOTE) Fact Sheet for Patients: BloggerCourse.com  Fact Sheet for Healthcare Providers: SeriousBroker.it  This  test is not yet approved or cleared by the [place] FDA and has been authorized for detection and/or diagnosis of SARS-CoV-2 by FDA under an  Emergency Use Authorization (EUA). This EUA will remain in effect (meaning this test can be used) for the duration of the COVID-19 declaration under Section 564(b)(1) of the Act, 21 U.S.C. section 360bbb-3(b)(1), unless the authorization is terminated or revoked.  Performed at [facility], [address], [place], [place] [address]   Blood Culture (routine x 2)     Status: None (Preliminary result)   Collection Time: 01/04/23 11:30 AM   Specimen: BLOOD  Result Value Ref Range Status   Specimen Description BLOOD RIGHT ANTECUBITAL  Final   Special Requests   Final    BOTTLES DRAWN AEROBIC AND ANAEROBIC Blood Culture results may not be optimal due to an excessive volume of blood received in culture bottles   Culture   Final    NO GROWTH 3 DAYS Performed at [facility], [address], [place], [place] [address]    Report Status PENDING  Incomplete  Blood Culture (routine x 2)     Status: None (Preliminary result)   Collection Time: 01/04/23 11:47 AM   Specimen: BLOOD  Result Value Ref Range Status   Specimen Description BLOOD SITE NOT SPECIFIED  Final   Special Requests   Final    BOTTLES DRAWN AEROBIC AND ANAEROBIC Blood Culture results may not be optimal due to an inadequate volume of blood received in culture bottles   Culture   Final    NO GROWTH 3 DAYS Performed at [facility], [address], [place], [place] [address]    Report Status PENDING  Incomplete     Radiology Studies: No results found.   Scheduled Meds:  alfuzosin  10 mg Oral Q breakfast   dexamethasone  6 mg Oral Daily   enoxaparin (LOVENOX) injection  40 mg Subcutaneous Q24H   levothyroxine  50 mcg Oral QAC breakfast   rosuvastatin  10 mg Oral Daily   sodium chloride flush  3 mL Intravenous Q12H   thiamine  50 mg Oral Daily   Continuous  Infusions:     LOS: 2 days    Time spent: 35 mins    Willeen Niece, MD [facility]   If 7PM-7AM, please contact night-coverage

## 2023-01-07 NOTE — Plan of Care (Signed)
  Problem: Education: Goal: Knowledge of risk factors and measures for prevention of condition will improve Outcome: Progressing   Problem: Coping: Goal: Psychosocial and spiritual needs will be supported Outcome: Progressing   Problem: Respiratory: Goal: Will maintain a patent airway Outcome: Progressing Goal: Complications related to the disease process, condition or treatment will be avoided or minimized Outcome: Progressing   

## 2023-01-08 DIAGNOSIS — J9601 Acute respiratory failure with hypoxia: Secondary | ICD-10-CM | POA: Diagnosis not present

## 2023-01-08 LAB — CBC
HCT: 42.9 % (ref 39.0–52.0)
Hemoglobin: 14.2 g/dL (ref 13.0–17.0)
MCH: 29.6 pg (ref 26.0–34.0)
MCHC: 33.1 g/dL (ref 30.0–36.0)
MCV: 89.4 fL (ref 80.0–100.0)
Platelets: 105 10*3/uL — ABNORMAL LOW (ref 150–400)
RBC: 4.8 MIL/uL (ref 4.22–5.81)
RDW: 14 % (ref 11.5–15.5)
WBC: 3.6 10*3/uL — ABNORMAL LOW (ref 4.0–10.5)
nRBC: 0 % (ref 0.0–0.2)

## 2023-01-08 LAB — BASIC METABOLIC PANEL
Anion gap: 6 (ref 5–15)
BUN: 14 mg/dL (ref 8–23)
CO2: 27 mmol/L (ref 22–32)
Calcium: 8.1 mg/dL — ABNORMAL LOW (ref 8.9–10.3)
Chloride: 101 mmol/L (ref 98–111)
Creatinine, Ser: 0.76 mg/dL (ref 0.61–1.24)
GFR, Estimated: >60 mL/min (ref >60)
Glucose, Bld: 107 mg/dL — ABNORMAL HIGH (ref 70–99)
Potassium: 3.7 mmol/L (ref 3.5–5.1)
Sodium: 134 mmol/L — ABNORMAL LOW (ref 135–145)

## 2023-01-08 LAB — MAGNESIUM: Magnesium: 2 mg/dL (ref 1.7–2.4)

## 2023-01-08 LAB — PHOSPHORUS: Phosphorus: 2.1 mg/dL — ABNORMAL LOW (ref 2.5–4.6)

## 2023-01-08 MED ORDER — GUAIFENESIN-DM 100-10 MG/5ML PO SYRP: 5.0000 mL | ORAL_SOLUTION | ORAL | 0 refills | Status: DC | PRN

## 2023-01-08 MED ORDER — DEXAMETHASONE 6 MG PO TABS: 6.0000 mg | ORAL_TABLET | Freq: Every day | ORAL | 0 refills | Status: AC

## 2023-01-08 MED ORDER — K PHOS MONO-SOD PHOS DI & MONO 155-852-130 MG PO TABS: 500.0000 mg | ORAL_TABLET | Freq: Two times a day (BID) | ORAL | 0 refills | Status: AC

## 2023-01-08 NOTE — Progress Notes (Signed)
Mobility Specialist Progress Note    01/08/23 1114  Mobility  Activity Ambulated with assistance in hallway  Level of Assistance Contact guard assist, steadying assist  Assistive Device Other (Comment) (HHA)  Distance Ambulated (ft) 600 ft  Activity Response Tolerated well  Mobility Referral Yes  $Mobility charge 1 Mobility  Mobility Specialist Start Time (ACUTE ONLY) 1054  Mobility Specialist Stop Time (ACUTE ONLY) 1113  Mobility Specialist Time Calculation (min) (ACUTE ONLY) 19 min   Pt received in chair and agreeable. No complaints on walk. Returned to chair with call bell in reach.   [place] Mobility Specialist  Please Neurosurgeon or Rehab Office at [phone]

## 2023-01-08 NOTE — Discharge Summary (Signed)
Physician Discharge Summary  Jason Barton ZOX:[id number] DOB: 1926-04-28 DOA: 01/04/2023  PCP: Corwin Levins, MD  Admit date: 01/04/2023  Discharge date: 01/08/2023  Admitted From: Home  Disposition:  Home Health Services.  Recommendations for Outpatient Follow-up:  Follow up with PCP in 1-2 weeks. Please obtain BMP/CBC in one week. Advised to take Decadron 6 mg daily for 6 more days.  Home Health:Home PT/OT Equipment/Devices:None  Discharge Condition: Stable CODE STATUS:Full code Diet recommendation: Heart Healthy   Brief [facility] Course: This 89 y.o. male with medical history significant of TIA, hyperlipidemia, hypothyroidism, COPD, constipation, bradycardia, bladder outlet obstruction, scoliosis, anxiety, low back pain presenting after a fall. Patient had been feeling well for the past couple days with some lethargy and fevers. PTA he stepped backwards and tried to lean into a closet door which he thought was closed and fell into the closet as it was open.  Unclear if he hit his head or lost consciousness, there was some swelling noted by EMS.  He currently is on warfarin for atrial fibrillation.  Suspicion for aspiration while he was on the floor as he was found in his vomit by EMS who thought he was there for at least several minutes if not longer.  EMS did notice 1 episode of bradycardia into the 30s and route.  Initially placed on nonrebreather but able to be weaned to 4 L supplemental oxygen. He's been found to have covid.  Currently on dexamethasone and remdesivir.   Of note, wife lives in the same facility as patient and has dementia. Patient was admitted for acute hypoxic respiratory failure secondary to COVID and bacterial pneumonia.  Patient started on remdesivir Decadron and empiric antibiotics ceftriaxone and Zithromax.  Patient admits significant improvement.  He is weaned down to room air.  PT and OT recommended home health services.  Patient feels better and  wants to be discharged.  He has completed remdesivir for 3 days.  Patient wants to be discharged.  Patient is being discharged home on Decadron.  Discharge Diagnoses:  Principal Problem:   Acute respiratory failure with hypoxia (HCC) Active Problems:   COPD (chronic obstructive pulmonary disease) (HCC)   Hyperlipidemia   Hypothyroidism   Bladder outlet obstruction   History of TIA (transient ischemic attack)   Anxiety   COVID-19 virus infection   Hypotension   COVID  Acute Hypoxic respiratory failure: >  Multifactorial  Aspiration Pneumonia, Covid 19 Virus Infection: - He is currently weaned down to room air. - CT chest with ground glass airspace opacities throughout the right lung base (aspiration vs infection)  - concern for aspiration. - Continue dexamethasone, remdesivir - Empirically started on antibiotics for bacterial pneumonia. - covid 19 positive - strict I/O, daily weights. -Continue supplemental oxygen and wean as tolerated. -Successfully weaned down to room air. -Antibiotics discontinued.   Bradycardia: Rates ranging from 30's-50's Appreciate cardiology assistance. -Avoid AV nodal blocking meds. No need for PpM. -HR now improved.   Fall: Suspect this is related to generalized weakness with covid 19 infection. PT/OT CT CTL spine without fracture . Soft tissue contusion of R forehead   History of TIA Hyperlipidemia - Continue home aspirin, rosuvastatin.   Hypothyroidism - Continue home Synthroid. - TSH wnl   Bladder outlet obstruction - Continue alfuzosin    History of COPD > Listed in chart but not on any medications   Thrombocytopenia > Platelets currently 71 down from baseline of 90-130. - Will hold off on chemoprophylaxis for DVT -  related to covid infection?   Constipation - Continue bowel regimen.  Discharge Instructions  Discharge Instructions     Call MD for:  difficulty breathing, headache or visual disturbances   Complete by: As  directed    Call MD for:  persistant dizziness or light-headedness   Complete by: As directed    Call MD for:  persistant nausea and vomiting   Complete by: As directed    Diet - low sodium heart healthy   Complete by: As directed    Diet Carb Modified   Complete by: As directed    Increase activity slowly   Complete by: As directed       Allergies as of 01/08/2023       Reactions   Augmentin [amoxicillin-pot Clavulanate] Rash   Itching rash to all torso 2 days after start augmentin 12/2017   Peanut-containing Drug Products Rash        Medication List     TAKE these medications    alfuzosin 10 MG 24 hr tablet Commonly known as: UROXATRAL Take 1 tablet (10 mg total) by mouth daily with breakfast.   Aspirin Low Dose 81 MG tablet Generic drug: aspirin EC TAKE 1 TABLET BY MOUTH DAILY. SWALLOW WHOLE. What changed: See the new instructions.   dexamethasone 6 MG tablet Commonly known as: DECADRON Take 1 tablet (6 mg total) by mouth daily for 6 days. Start taking on: January 09, 2023   guaiFENesin-dextromethorphan 100-10 MG/5ML syrup Commonly known as: ROBITUSSIN DM Take 5 mLs by mouth every 4 (four) hours as needed for cough.   levothyroxine 50 MCG tablet Commonly known as: SYNTHROID TAKE 1 TABLET BY MOUTH EVERY DAY BEFORE BREAKFAST What changed: See the new instructions.   phosphorus 155-852-130 MG tablet Commonly known as: K PHOS NEUTRAL Take 2 tablets (500 mg total) by mouth 2 (two) times daily for 2 days.   polyethylene glycol 17 g packet Commonly known as: MIRALAX / GLYCOLAX Take 17 g by mouth daily.   rosuvastatin 10 MG tablet Commonly known as: CRESTOR TAKE 1 TABLET BY MOUTH EVERY DAY   selenium 50 MCG Tabs tablet Take 50 mcg by mouth daily.   thiamine 50 MG tablet Take 1 tablet (50 mg total) by mouth daily.   vitamin C with rose hips 500 MG tablet Take 500 mg by mouth daily.   VITAMIN D PO Take 5,000 Units by mouth every morning.   Zinc 50 MG  Tabs Take by mouth.        Follow-up Information     Corrin Parker, PA-C Follow up.   Specialty: Cardiology Why: [facility] - [facility] location - cardiology follow-up on Tuesday Jan 24, 2023 at 2:20 PM (Arrive by 2:05 PM). Callie is one of our PAs that works with Dr. Jens Som. Contact information: [address] [place] [address] [phone]         Corwin Levins, MD Follow up in 1 week(s).   Specialties: Internal Medicine, Radiology Contact information: [address] [place] [address] [phone]                Allergies  Allergen Reactions   Augmentin [Amoxicillin-Pot Clavulanate] Rash    Itching rash to all torso 2 days after start augmentin 12/2017   Peanut-Containing Drug Products Rash    Consultations: None   Procedures/Studies: CT HEAD WO CONTRAST  Result Date: 01/04/2023 CLINICAL DATA:  Fall EXAM: CT HEAD WITHOUT CONTRAST CT CERVICAL SPINE WITHOUT CONTRAST TECHNIQUE:  Multidetector CT imaging of the head and cervical spine was performed following the standard protocol without intravenous contrast. Multiplanar CT image reconstructions of the cervical spine were also generated. RADIATION DOSE REDUCTION: This exam was performed according to the departmental dose-optimization program which includes automated exposure control, adjustment of the mA and/or kV according to patient size and/or use of iterative reconstruction technique. COMPARISON:  06/23/2021 FINDINGS: CT HEAD FINDINGS Brain: No evidence of acute infarction, hemorrhage, hydrocephalus, extra-axial collection or mass lesion/mass effect. Periventricular and deep white matter hypodensity. Vascular: No hyperdense vessel or unexpected calcification. Skull: Normal. Negative for fracture or focal lesion. Sinuses/Orbits: No acute finding. Other: Soft tissue contusion of the right forehead (series 3, image 22). CT CERVICAL SPINE FINDINGS Alignment: Normal. Skull base and  vertebrae: No acute fracture. No primary bone lesion or focal pathologic process. Soft tissues and spinal canal: No prevertebral fluid or swelling. No visible canal hematoma. Disc levels: Mild-to-moderate disc space height loss and osteophytosis, worst at C3-C4. Upper chest: Negative. Other: None. IMPRESSION: 1. No acute intracranial pathology. Small-vessel white matter disease. 2. Soft tissue contusion of the right forehead. 3. No fracture or static subluxation of the cervical spine. 4. Mild-to-moderate cervical disc degenerative disease. Electronically Signed   By: Jearld Lesch M.D.   On: 01/04/2023 13:29   CT CERVICAL SPINE WO CONTRAST  Result Date: 01/04/2023 CLINICAL DATA:  Fall EXAM: CT HEAD WITHOUT CONTRAST CT CERVICAL SPINE WITHOUT CONTRAST TECHNIQUE: Multidetector CT imaging of the head and cervical spine was performed following the standard protocol without intravenous contrast. Multiplanar CT image reconstructions of the cervical spine were also generated. RADIATION DOSE REDUCTION: This exam was performed according to the departmental dose-optimization program which includes automated exposure control, adjustment of the mA and/or kV according to patient size and/or use of iterative reconstruction technique. COMPARISON:  06/23/2021 FINDINGS: CT HEAD FINDINGS Brain: No evidence of acute infarction, hemorrhage, hydrocephalus, extra-axial collection or mass lesion/mass effect. Periventricular and deep white matter hypodensity. Vascular: No hyperdense vessel or unexpected calcification. Skull: Normal. Negative for fracture or focal lesion. Sinuses/Orbits: No acute finding. Other: Soft tissue contusion of the right forehead (series 3, image 22). CT CERVICAL SPINE FINDINGS Alignment: Normal. Skull base and vertebrae: No acute fracture. No primary bone lesion or focal pathologic process. Soft tissues and spinal canal: No prevertebral fluid or swelling. No visible canal hematoma. Disc levels: Mild-to-moderate  disc space height loss and osteophytosis, worst at C3-C4. Upper chest: Negative. Other: None. IMPRESSION: 1. No acute intracranial pathology. Small-vessel white matter disease. 2. Soft tissue contusion of the right forehead. 3. No fracture or static subluxation of the cervical spine. 4. Mild-to-moderate cervical disc degenerative disease. Electronically Signed   By: Jearld Lesch M.D.   On: 01/04/2023 13:29   CT CHEST ABDOMEN PELVIS WO CONTRAST  Result Date: 01/04/2023 CLINICAL DATA:  Fall EXAM: CT CHEST, ABDOMEN, AND PELVIS WITHOUT CONTRAST CT THORACIC AND LUMBAR SPINE WITHOUT CONTRAST TECHNIQUE: Multidetector CT imaging of the chest, abdomen and pelvis was performed following the standard protocol without administration of intravenous contrast. Multidetector CT imaging of the thoracic and lumbar spine was performed following the standard protocol without administration of intravenous contrast. RADIATION DOSE REDUCTION: This exam was performed according to the departmental dose-optimization program which includes automated exposure control, adjustment of the mA and/or kV according to patient size and/or use of iterative reconstruction technique. COMPARISON:  None Available. FINDINGS: CT CHEST FINDINGS Cardiovascular: Aortic atherosclerosis. Normal heart size. Left coronary artery calcifications. No pericardial effusion. Mediastinum/Nodes: No enlarged  mediastinal, hilar, or axillary lymph nodes. Thyroid gland, trachea, and esophagus demonstrate no significant findings. Lungs/Pleura: Scattered ground-glass airspace opacities throughout the right lung base (series 4, image 69). No pleural effusion or pneumothorax. Musculoskeletal: No chest wall mass or suspicious osseous lesions identified. CT ABDOMEN PELVIS FINDINGS Hepatobiliary: No solid liver abnormality is seen. No gallstones, gallbladder wall thickening, or biliary dilatation. Pancreas: Unremarkable. No pancreatic ductal dilatation or surrounding  inflammatory changes. Spleen: Normal in size without significant abnormality. Adrenals/Urinary Tract: Adrenal glands are unremarkable. Kidneys are normal, without renal calculi, solid lesion, or hydronephrosis. Bladder is unremarkable. Stomach/Bowel: Stomach is within normal limits. Appendix appears normal. No evidence of bowel wall thickening, distention, or inflammatory changes. Descending and sigmoid diverticulosis. Large burden of stool throughout the colon and rectum. Vascular/Lymphatic: Aortic atherosclerosis. No enlarged abdominal or pelvic lymph nodes. Reproductive: Prostatomegaly. Other: Small, fat containing midline epigastric hernia (series 3, image 83). No ascites. Musculoskeletal: No acute osseous findings. CT THORACIC AND LUMBAR SPINE FINDINGS Alignment: Gentle dextroscoliosis of the thoracic spine, with otherwise normal thoracic kyphosis. Normal lumbar lordosis. Vertebral bodies: Intact. No fracture or dislocation. Disc spaces: Mild disc space height loss and osteophytosis throughout the thoracic spine. Mild disc degenerative change L1-L2 with otherwise intact lumbar disc spaces. Mild facet degenerative change of the lower lumbar levels. Paraspinous soft tissues: Unremarkable. IMPRESSION: 1. Scattered ground-glass airspace opacities throughout the right lung base, consistent with infection or aspiration. 2. No noncontrast CT evidence of acute traumatic injury to the chest, abdomen, or pelvis. 3. No fracture or dislocation of the thoracic or lumbar spine. 4. Descending and sigmoid diverticulosis. 5. Large burden of stool throughout the colon and rectum. 6. Prostatomegaly. 7. Coronary artery disease. Aortic Atherosclerosis (ICD10-I70.0). Electronically Signed   By: Jearld Lesch M.D.   On: 01/04/2023 13:23   CT T-SPINE NO CHARGE  Result Date: 01/04/2023 CLINICAL DATA:  Fall EXAM: CT CHEST, ABDOMEN, AND PELVIS WITHOUT CONTRAST CT THORACIC AND LUMBAR SPINE WITHOUT CONTRAST TECHNIQUE: Multidetector CT  imaging of the chest, abdomen and pelvis was performed following the standard protocol without administration of intravenous contrast. Multidetector CT imaging of the thoracic and lumbar spine was performed following the standard protocol without administration of intravenous contrast. RADIATION DOSE REDUCTION: This exam was performed according to the departmental dose-optimization program which includes automated exposure control, adjustment of the mA and/or kV according to patient size and/or use of iterative reconstruction technique. COMPARISON:  None Available. FINDINGS: CT CHEST FINDINGS Cardiovascular: Aortic atherosclerosis. Normal heart size. Left coronary artery calcifications. No pericardial effusion. Mediastinum/Nodes: No enlarged mediastinal, hilar, or axillary lymph nodes. Thyroid gland, trachea, and esophagus demonstrate no significant findings. Lungs/Pleura: Scattered ground-glass airspace opacities throughout the right lung base (series 4, image 69). No pleural effusion or pneumothorax. Musculoskeletal: No chest wall mass or suspicious osseous lesions identified. CT ABDOMEN PELVIS FINDINGS Hepatobiliary: No solid liver abnormality is seen. No gallstones, gallbladder wall thickening, or biliary dilatation. Pancreas: Unremarkable. No pancreatic ductal dilatation or surrounding inflammatory changes. Spleen: Normal in size without significant abnormality. Adrenals/Urinary Tract: Adrenal glands are unremarkable. Kidneys are normal, without renal calculi, solid lesion, or hydronephrosis. Bladder is unremarkable. Stomach/Bowel: Stomach is within normal limits. Appendix appears normal. No evidence of bowel wall thickening, distention, or inflammatory changes. Descending and sigmoid diverticulosis. Large burden of stool throughout the colon and rectum. Vascular/Lymphatic: Aortic atherosclerosis. No enlarged abdominal or pelvic lymph nodes. Reproductive: Prostatomegaly. Other: Small, fat containing midline  epigastric hernia (series 3, image 83). No ascites. Musculoskeletal: No acute osseous findings. CT THORACIC  AND LUMBAR SPINE FINDINGS Alignment: Gentle dextroscoliosis of the thoracic spine, with otherwise normal thoracic kyphosis. Normal lumbar lordosis. Vertebral bodies: Intact. No fracture or dislocation. Disc spaces: Mild disc space height loss and osteophytosis throughout the thoracic spine. Mild disc degenerative change L1-L2 with otherwise intact lumbar disc spaces. Mild facet degenerative change of the lower lumbar levels. Paraspinous soft tissues: Unremarkable. IMPRESSION: 1. Scattered ground-glass airspace opacities throughout the right lung base, consistent with infection or aspiration. 2. No noncontrast CT evidence of acute traumatic injury to the chest, abdomen, or pelvis. 3. No fracture or dislocation of the thoracic or lumbar spine. 4. Descending and sigmoid diverticulosis. 5. Large burden of stool throughout the colon and rectum. 6. Prostatomegaly. 7. Coronary artery disease. Aortic Atherosclerosis (ICD10-I70.0). Electronically Signed   By: Jearld Lesch M.D.   On: 01/04/2023 13:23   CT L-SPINE NO CHARGE  Result Date: 01/04/2023 CLINICAL DATA:  Fall EXAM: CT CHEST, ABDOMEN, AND PELVIS WITHOUT CONTRAST CT THORACIC AND LUMBAR SPINE WITHOUT CONTRAST TECHNIQUE: Multidetector CT imaging of the chest, abdomen and pelvis was performed following the standard protocol without administration of intravenous contrast. Multidetector CT imaging of the thoracic and lumbar spine was performed following the standard protocol without administration of intravenous contrast. RADIATION DOSE REDUCTION: This exam was performed according to the departmental dose-optimization program which includes automated exposure control, adjustment of the mA and/or kV according to patient size and/or use of iterative reconstruction technique. COMPARISON:  None Available. FINDINGS: CT CHEST FINDINGS Cardiovascular: Aortic  atherosclerosis. Normal heart size. Left coronary artery calcifications. No pericardial effusion. Mediastinum/Nodes: No enlarged mediastinal, hilar, or axillary lymph nodes. Thyroid gland, trachea, and esophagus demonstrate no significant findings. Lungs/Pleura: Scattered ground-glass airspace opacities throughout the right lung base (series 4, image 69). No pleural effusion or pneumothorax. Musculoskeletal: No chest wall mass or suspicious osseous lesions identified. CT ABDOMEN PELVIS FINDINGS Hepatobiliary: No solid liver abnormality is seen. No gallstones, gallbladder wall thickening, or biliary dilatation. Pancreas: Unremarkable. No pancreatic ductal dilatation or surrounding inflammatory changes. Spleen: Normal in size without significant abnormality. Adrenals/Urinary Tract: Adrenal glands are unremarkable. Kidneys are normal, without renal calculi, solid lesion, or hydronephrosis. Bladder is unremarkable. Stomach/Bowel: Stomach is within normal limits. Appendix appears normal. No evidence of bowel wall thickening, distention, or inflammatory changes. Descending and sigmoid diverticulosis. Large burden of stool throughout the colon and rectum. Vascular/Lymphatic: Aortic atherosclerosis. No enlarged abdominal or pelvic lymph nodes. Reproductive: Prostatomegaly. Other: Small, fat containing midline epigastric hernia (series 3, image 83). No ascites. Musculoskeletal: No acute osseous findings. CT THORACIC AND LUMBAR SPINE FINDINGS Alignment: Gentle dextroscoliosis of the thoracic spine, with otherwise normal thoracic kyphosis. Normal lumbar lordosis. Vertebral bodies: Intact. No fracture or dislocation. Disc spaces: Mild disc space height loss and osteophytosis throughout the thoracic spine. Mild disc degenerative change L1-L2 with otherwise intact lumbar disc spaces. Mild facet degenerative change of the lower lumbar levels. Paraspinous soft tissues: Unremarkable. IMPRESSION: 1. Scattered ground-glass airspace  opacities throughout the right lung base, consistent with infection or aspiration. 2. No noncontrast CT evidence of acute traumatic injury to the chest, abdomen, or pelvis. 3. No fracture or dislocation of the thoracic or lumbar spine. 4. Descending and sigmoid diverticulosis. 5. Large burden of stool throughout the colon and rectum. 6. Prostatomegaly. 7. Coronary artery disease. Aortic Atherosclerosis (ICD10-I70.0). Electronically Signed   By: Jearld Lesch M.D.   On: 01/04/2023 13:23   DG Chest Port 1 View  Result Date: 01/04/2023 CLINICAL DATA:  Level 2 trauma. Patient fell through door  way. Complains of shortness of breath, fever and COVID exposure. EXAM: PORTABLE CHEST 1 VIEW COMPARISON:  02/05/20 FINDINGS: Heart size and mediastinal contours are normal. Aortic atherosclerosis. Lungs appear hyperinflated with by lateral upper lobe scarring. No superimposed pleural effusion, interstitial edema or airspace consolidation. Curvature of the thoracic spine is convex towards the right. No acute osseous findings. IMPRESSION: 1. No acute cardiopulmonary disease. 2.  Aortic Atherosclerosis (ICD10-I70.0). Electronically Signed   By: Signa Kell M.D.   On: 01/04/2023 12:18   DG Pelvis Portable  Result Date: 01/04/2023 CLINICAL DATA:  Fall. EXAM: PORTABLE PELVIS 1-2 VIEWS COMPARISON:  None Available. FINDINGS: There is no evidence of pelvic fracture or diastasis. No pelvic bone lesions are seen. IMPRESSION: Negative. Electronically Signed   By: Obie Dredge M.D.   On: 01/04/2023 12:13     Subjective: Patient was seen and examined at bedside.  Overnight events noted.   Patient reports doing much better and wants to be discharged.  Patient being discharged home,  home health services arranged.  Discharge Exam: Vitals:   01/08/23 0545 01/08/23 0800  BP: (!) 147/96 137/81  Pulse: 66 60  Resp: 20 13  Temp: (!) 97.3 F (36.3 C)   SpO2: 98% 92%   Vitals:   01/08/23 0034 01/08/23 0400 01/08/23 0545  01/08/23 0800  BP: (!) 120/55 (!) 140/73 (!) 147/96 137/81  Pulse: (!) 48  66 60  Resp: 14 (!) 23 20 13   Temp: (!) 97.1 F (36.2 C)  (!) 97.3 F (36.3 C)   TempSrc: Axillary  Oral   SpO2: 96% 95% 98% 92%  Weight:      Height:        General: Pt is alert, awake, not in acute distress Cardiovascular: RRR, S1/S2 +, no rubs, no gallops Respiratory: CTA bilaterally, no wheezing, no rhonchi Abdominal: Soft, NT, ND, bowel sounds + Extremities: no edema, no cyanosis    The results of significant diagnostics from this hospitalization (including imaging, microbiology, ancillary and laboratory) are listed below for reference.     Microbiology: Recent Results (from the past 240 hour(s))  Resp panel by RT-PCR (RSV, Flu A&B, Covid) Anterior Nasal Swab     Status: Abnormal   Collection Time: 01/04/23 11:20 AM   Specimen: Anterior Nasal Swab  Result Value Ref Range Status   SARS Coronavirus 2 by RT PCR POSITIVE (A) NEGATIVE Final   Influenza A by PCR NEGATIVE NEGATIVE Final   Influenza B by PCR NEGATIVE NEGATIVE Final    Comment: (NOTE) The Xpert Xpress SARS-CoV-2/FLU/RSV plus assay is intended as an aid in the diagnosis of influenza from Nasopharyngeal swab specimens and should not be used as a sole basis for treatment. Nasal washings and aspirates are unacceptable for Xpert Xpress SARS-CoV-2/FLU/RSV testing.  Fact Sheet for Patients: BloggerCourse.com  Fact Sheet for Healthcare Providers: SeriousBroker.it  This test is not yet approved or cleared by the [place] FDA and has been authorized for detection and/or diagnosis of SARS-CoV-2 by FDA under an Emergency Use Authorization (EUA). This EUA will remain in effect (meaning this test can be used) for the duration of the COVID-19 declaration under Section 564(b)(1) of the Act, 21 U.S.C. section 360bbb-3(b)(1), unless the authorization is terminated or revoked.     Resp  Syncytial Virus by PCR NEGATIVE NEGATIVE Final    Comment: (NOTE) Fact Sheet for Patients: BloggerCourse.com  Fact Sheet for Healthcare Providers: SeriousBroker.it  This test is not yet approved or cleared by the [place] and  has been authorized for detection and/or diagnosis of SARS-CoV-2 by FDA under an Emergency Use Authorization (EUA). This EUA will remain in effect (meaning this test can be used) for the duration of the COVID-19 declaration under Section 564(b)(1) of the Act, 21 U.S.C. section 360bbb-3(b)(1), unless the authorization is terminated or revoked.  Performed at [facility], [address], [place], [place] [address]   Blood Culture (routine x 2)     Status: None (Preliminary result)   Collection Time: 01/04/23 11:30 AM   Specimen: BLOOD  Result Value Ref Range Status   Specimen Description BLOOD RIGHT ANTECUBITAL  Final   Special Requests   Final    BOTTLES DRAWN AEROBIC AND ANAEROBIC Blood Culture results may not be optimal due to an excessive volume of blood received in culture bottles   Culture   Final    NO GROWTH 4 DAYS Performed at [facility], [address], [place], [place] [address]    Report Status PENDING  Incomplete  Blood Culture (routine x 2)     Status: None (Preliminary result)   Collection Time: 01/04/23 11:47 AM   Specimen: BLOOD  Result Value Ref Range Status   Specimen Description BLOOD SITE NOT SPECIFIED  Final   Special Requests   Final    BOTTLES DRAWN AEROBIC AND ANAEROBIC Blood Culture results may not be optimal due to an inadequate volume of blood received in culture bottles   Culture   Final    NO GROWTH 4 DAYS Performed at [facility], [address], [place], [place] [address]    Report Status PENDING  Incomplete     Labs: BNP (last 3 results) Recent Labs    01/04/23 1130 01/05/23 0708  BNP 465.7* 797.0*   Basic Metabolic Panel: Recent  Labs  Lab 01/04/23 1130 01/04/23 1139 01/05/23 0714 01/06/23 1252 01/08/23 0841  NA 136 137 133* 137 134*  K 3.8 3.7 3.3* 3.7 3.7  CL 100 100 102 105 101  CO2 23  --  24 25 27   GLUCOSE 131* 127* 123* 104* 107*  BUN 18 20 14 18 14   CREATININE 1.00 0.90 0.93 0.86 0.76  CALCIUM 8.5*  --  7.6* 8.1* 8.1*  MG  --   --   --  1.8 2.0  PHOS  --   --   --  2.0* 2.1*   Liver Function Tests: Recent Labs  Lab 01/04/23 1130 01/05/23 0714 01/06/23 1252  AST 27 25 29   ALT 19 17 21   ALKPHOS 65 49 52  BILITOT 0.6 0.4 0.4  PROT 5.9* 4.9* 5.4*  ALBUMIN 3.6 2.7* 3.0*   No results for input(s): "LIPASE", "AMYLASE" in the last 168 hours. No results for input(s): "AMMONIA" in the last 168 hours. CBC: Recent Labs  Lab 01/04/23 1130 01/04/23 1139 01/05/23 0714 01/06/23 1252 01/08/23 0841  WBC 3.5*  --  7.5 5.3 3.6*  NEUTROABS  --   --   --  3.6  --   HGB 13.4 13.3 13.3 14.4 14.2  HCT 40.7 39.0 39.9 43.2 42.9  MCV 91.9  --  93.9 90.4 89.4  PLT 71*  --  76* 95* 105*   Cardiac Enzymes: Recent Labs  Lab 01/04/23 1130  CKTOTAL 107   BNP: Invalid input(s): "POCBNP" CBG: No results for input(s): "GLUCAP" in the last 168 hours. D-Dimer Recent Labs    01/06/23 1252  DDIMER 1.04*   Hgb A1c No results for input(s): "HGBA1C"  in the last 72 hours. Lipid Profile No results for input(s): "CHOL", "HDL", "LDLCALC", "TRIG", "CHOLHDL", "LDLDIRECT" in the last 72 hours. Thyroid function studies No results for input(s): "TSH", "T4TOTAL", "T3FREE", "THYROIDAB" in the last 72 hours.  Invalid input(s): "FREET3" Anemia work up No results for input(s): "VITAMINB12", "FOLATE", "FERRITIN", "TIBC", "IRON", "RETICCTPCT" in the last 72 hours. Urinalysis    Component Value Date/Time   COLORURINE YELLOW 01/04/2023 1118   APPEARANCEUR CLEAR 01/04/2023 1118   LABSPEC 1.020 01/04/2023 1118   PHURINE 5.0 01/04/2023 1118   GLUCOSEU NEGATIVE 01/04/2023 1118   GLUCOSEU NEGATIVE 08/28/2020 1000    HGBUR NEGATIVE 01/04/2023 1118   BILIRUBINUR NEGATIVE 01/04/2023 1118   KETONESUR 5 (A) 01/04/2023 1118   PROTEINUR NEGATIVE 01/04/2023 1118   UROBILINOGEN 0.2 08/28/2020 1000   NITRITE NEGATIVE 01/04/2023 1118   LEUKOCYTESUR NEGATIVE 01/04/2023 1118   Sepsis Labs Recent Labs  Lab 01/04/23 1130 01/05/23 0714 01/06/23 1252 01/08/23 0841  WBC 3.5* 7.5 5.3 3.6*   Microbiology Recent Results (from the past 240 hour(s))  Resp panel by RT-PCR (RSV, Flu A&B, Covid) Anterior Nasal Swab     Status: Abnormal   Collection Time: 01/04/23 11:20 AM   Specimen: Anterior Nasal Swab  Result Value Ref Range Status   SARS Coronavirus 2 by RT PCR POSITIVE (A) NEGATIVE Final   Influenza A by PCR NEGATIVE NEGATIVE Final   Influenza B by PCR NEGATIVE NEGATIVE Final    Comment: (NOTE) The Xpert Xpress SARS-CoV-2/FLU/RSV plus assay is intended as an aid in the diagnosis of influenza from Nasopharyngeal swab specimens and should not be used as a sole basis for treatment. Nasal washings and aspirates are unacceptable for Xpert Xpress SARS-CoV-2/FLU/RSV testing.  Fact Sheet for Patients: BloggerCourse.com  Fact Sheet for Healthcare Providers: SeriousBroker.it  This test is not yet approved or cleared by the [place] FDA and has been authorized for detection and/or diagnosis of SARS-CoV-2 by FDA under an Emergency Use Authorization (EUA). This EUA will remain in effect (meaning this test can be used) for the duration of the COVID-19 declaration under Section 564(b)(1) of the Act, 21 U.S.C. section 360bbb-3(b)(1), unless the authorization is terminated or revoked.     Resp Syncytial Virus by PCR NEGATIVE NEGATIVE Final    Comment: (NOTE) Fact Sheet for Patients: BloggerCourse.com  Fact Sheet for Healthcare Providers: SeriousBroker.it  This test is not yet approved or cleared by the [place] FDA and has been authorized for detection and/or diagnosis of SARS-CoV-2 by FDA under an Emergency Use Authorization (EUA). This EUA will remain in effect (meaning this test can be used) for the duration of the COVID-19 declaration under Section 564(b)(1) of the Act, 21 U.S.C. section 360bbb-3(b)(1), unless the authorization is terminated or revoked.  Performed at [facility], [address] [place], [place] [address]   Blood Culture (routine x 2)     Status: None (Preliminary result)   Collection Time: 01/04/23 11:30 AM   Specimen: BLOOD  Result Value Ref Range Status   Specimen Description BLOOD RIGHT ANTECUBITAL  Final   Special Requests   Final    BOTTLES DRAWN AEROBIC AND ANAEROBIC Blood Culture results may not be optimal due to an excessive volume of blood received in culture bottles   Culture   Final    NO GROWTH 4 DAYS Performed at [facility], [address], [place], [place] [address]    Report Status PENDING  Incomplete  Blood Culture (routine x 2)  Status: None (Preliminary result)   Collection Time: 01/04/23 11:47 AM   Specimen: BLOOD  Result Value Ref Range Status   Specimen Description BLOOD SITE NOT SPECIFIED  Final   Special Requests   Final    BOTTLES DRAWN AEROBIC AND ANAEROBIC Blood Culture results may not be optimal due to an inadequate volume of blood received in culture bottles   Culture   Final    NO GROWTH 4 DAYS Performed at [facility], [address], [place], [place] [address]    Report Status PENDING  Incomplete     Time coordinating discharge: Over 30 minutes  SIGNED:   Willeen Niece, MD  [facility] 01/08/2023, 12:02 PM Pager   If 7PM-7AM, please contact night-coverage

## 2023-01-08 NOTE — Discharge Instructions (Signed)
Advised to follow-up with primary care physician in 1 week. Advised to take Decadron 6 mg daily for 6 more days.

## 2023-01-08 NOTE — TOC Transition Note (Signed)
Transition of Care [facility]) - CM/SW Discharge Note   Patient Details  Name: Jason Barton MRN: [id number] Date of Birth: 1925-07-19  [facility] of [facility] CM/SW Contact:  Ronny Bacon, RN Phone Number: 01/08/2023, 12:19 PM   Clinical Narrative: Patient is being discharged to [facility] today. Spoke with daughter [name] who confirms that she will pick up patient at discharge. HH PT/OT orders faxed to [facility] at [phone].     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) Date HH Agency Contacted: 01/08/23 Time HH Agency Contacted: 1219 Representative spoke with at [facility] Agency: Order faxed to [phone]  Social Determinants of Health (SDOH) Interventions SDOH Screenings   Food Insecurity: No Food Insecurity (04/19/2022)  Housing: Low Risk  (04/19/2022)  Transportation Needs: No Transportation Needs (04/19/2022)  Utilities: Not At Risk (04/19/2022)  Alcohol Screen: Low Risk  (04/19/2022)  Depression (PHQ2-9): Low Risk  (07/15/2022)  Financial Resource Strain: Low Risk  (04/19/2022)  Physical Activity: Insufficiently Active (04/19/2022)  Social Connections: Socially Integrated (04/19/2022)  Stress: No Stress Concern Present (04/19/2022)  Tobacco Use: Low Risk  (10/14/2022)     Readmission Risk Interventions     No data to display

## 2023-01-09 ENCOUNTER — Telehealth: Payer: Self-pay

## 2023-01-09 LAB — CULTURE, BLOOD (ROUTINE X 2): Culture: NO GROWTH

## 2023-01-09 NOTE — Transitions of Care (Post Inpatient/ED Visit) (Signed)
01/09/2023  Name: Jason Barton MRN: [id number] DOB: Oct 10, 1925  Today's TOC FU Call Status: Today's TOC FU Call Status:: Successful TOC FU Call Competed TOC FU Call Complete Date: 01/09/23  Transition Care Management Follow-up Telephone Call Date of Discharge: 01/08/23 Discharge Facility: [facility]) Type of Discharge: Inpatient Admission Primary Inpatient Discharge Diagnosis:: Acute respiratory failure with hypoxia How have you been since you were released from the hospital?: Better Any questions or concerns?: No  Items Reviewed: Did you receive and understand the discharge instructions provided?: Yes Medications obtained,verified, and reconciled?: Yes (Medications Reviewed) Any new allergies since your discharge?: No Dietary orders reviewed?: Yes Do you have support at home?: Yes  Medications Reviewed Today: Medications Reviewed Today     Reviewed by Merleen Nicely, LPN (Licensed Practical Nurse) on 01/09/23 at 1028  Med List Status: <None>   Medication Order Taking? Sig Documenting Provider Last Dose Status Informant  alfuzosin (UROXATRAL) 10 MG 24 hr tablet [id number] Yes Take 1 tablet (10 mg total) by mouth daily with breakfast. Etta Grandchild, MD Taking Active Child, Self  Ascorbic Acid (VITAMIN C WITH ROSE HIPS) 500 MG tablet [id number] Yes Take 500 mg by mouth daily. [provider] Taking Active Child, Self  ASPIRIN LOW DOSE 81 MG tablet [id number] Yes TAKE 1 TABLET BY MOUTH DAILY. SWALLOW WHOLE.  Patient taking differently: Take 81 mg by mouth daily.   Corwin Levins, MD Taking Active Child, Self  Cholecalciferol (VITAMIN D PO) [id number] Yes Take 5,000 Units by mouth every morning. [provider] Taking Active Child, Self  dexamethasone (DECADRON) 6 MG tablet [id number] Yes Take 1 tablet (6 mg total) by mouth daily for 6 days. Willeen Niece, MD Taking Active   guaiFENesin-dextromethorphan [facility] DM) 100-10 MG/5ML syrup [id number] Yes  Take 5 mLs by mouth every 4 (four) hours as needed for cough. Willeen Niece, MD Taking Active   levothyroxine (SYNTHROID) 50 MCG tablet [id number] Yes TAKE 1 TABLET BY MOUTH EVERY DAY BEFORE BREAKFAST  Patient taking differently: Take 50 mcg by mouth daily before breakfast.   Corwin Levins, MD Taking Active Child, Self  phosphorus (K PHOS NEUTRAL) [phone] MG tablet [id number]  Take 2 tablets (500 mg total) by mouth 2 (two) times daily for 2 days. Willeen Niece, MD  Active   polyethylene glycol (MIRALAX / GLYCOLAX) 17 g packet [id number]  Take 17 g by mouth daily. [provider]  Active Child, Self  rosuvastatin (CRESTOR) 10 MG tablet [id number]  TAKE 1 TABLET BY MOUTH EVERY DAY Corwin Levins, MD  Active Child, Self  selenium 50 MCG TABS tablet [id number]  Take 50 mcg by mouth daily. [provider]  Active Child, Self  thiamine 50 MG tablet [id number]  Take 1 tablet (50 mg total) by mouth daily. Etta Grandchild, MD  Active Child, Self  Zinc 50 MG TABS [id number]  Take by mouth. [provider]  Active Child, Self            Home Care and Equipment/Supplies: Were Home Health Services Ordered?: Yes Name of Home Health Agency:: [facility] Has Agency set up a time to come to your home?: No Any new equipment or medical supplies ordered?: No  Functional Questionnaire: Do you need assistance with bathing/showering or dressing?: Yes Do you need assistance with meal preparation?: Yes Do you need assistance with eating?: Yes Do you have difficulty maintaining continence: No Do you need assistance with getting out of bed/getting out of a  chair/moving?: Yes Do you have difficulty managing or taking your medications?: No  Follow up appointments reviewed: PCP Follow-up appointment confirmed?: Yes Date of PCP follow-up appointment?: 01/12/23 Follow-up Provider: Dr Laray Anger Follow-up appointment confirmed?: Yes Date of Specialist follow-up  appointment?: 01/24/23 Follow-Up Specialty Provider:: Marjie Skiff PA-C Do you need transportation to your follow-up appointment?: No Do you understand care options if your condition(s) worsen?: Yes-patient verbalized understanding    SIGNATURE  Woodfin Ganja LPN [facility] Nurse Health Advisor Direct Dial [phone]

## 2023-01-11 ENCOUNTER — Ambulatory Visit: Payer: Medicare Other | Admitting: Internal Medicine

## 2023-01-11 ENCOUNTER — Encounter: Payer: Self-pay | Admitting: Internal Medicine

## 2023-01-11 VITALS — BP 118/70 | HR 60 | Temp 97.9°F | Ht 67.0 in | Wt 130.0 lb

## 2023-01-11 DIAGNOSIS — E039 Hypothyroidism, unspecified: Secondary | ICD-10-CM | POA: Diagnosis not present

## 2023-01-11 DIAGNOSIS — D696 Thrombocytopenia, unspecified: Secondary | ICD-10-CM

## 2023-01-11 DIAGNOSIS — K5904 Chronic idiopathic constipation: Secondary | ICD-10-CM | POA: Diagnosis not present

## 2023-01-11 DIAGNOSIS — J449 Chronic obstructive pulmonary disease, unspecified: Secondary | ICD-10-CM | POA: Diagnosis not present

## 2023-01-11 DIAGNOSIS — E7800 Pure hypercholesterolemia, unspecified: Secondary | ICD-10-CM

## 2023-01-11 DIAGNOSIS — R7302 Impaired glucose tolerance (oral): Secondary | ICD-10-CM

## 2023-01-11 LAB — CBC WITH DIFFERENTIAL/PLATELET
Basophils Absolute: 0 10*3/uL (ref 0.0–0.1)
Basophils Relative: 0.1 % (ref 0.0–3.0)
Eosinophils Absolute: 0 10*3/uL (ref 0.0–0.7)
Eosinophils Relative: 0.1 % (ref 0.0–5.0)
HCT: 42.7 % (ref 39.0–52.0)
Hemoglobin: 14 g/dL (ref 13.0–17.0)
Lymphocytes Relative: 6.6 % — ABNORMAL LOW (ref 12.0–46.0)
Lymphs Abs: 0.5 10*3/uL — ABNORMAL LOW (ref 0.7–4.0)
MCHC: 32.9 g/dL (ref 30.0–36.0)
MCV: 90.8 fl (ref 78.0–100.0)
Monocytes Absolute: 0.4 10*3/uL (ref 0.1–1.0)
Monocytes Relative: 6.1 % (ref 3.0–12.0)
Neutro Abs: 6.4 10*3/uL (ref 1.4–7.7)
Neutrophils Relative %: 87.1 % — ABNORMAL HIGH (ref 43.0–77.0)
Platelets: 134 10*3/uL — ABNORMAL LOW (ref 150.0–400.0)
RBC: 4.7 Mil/uL (ref 4.22–5.81)
RDW: 14.6 % (ref 11.5–15.5)
WBC: 7.3 10*3/uL (ref 4.0–10.5)

## 2023-01-11 LAB — BASIC METABOLIC PANEL
BUN: 20 mg/dL (ref 6–23)
CO2: 26 mEq/L (ref 19–32)
Calcium: 8.3 mg/dL — ABNORMAL LOW (ref 8.4–10.5)
Chloride: 101 mEq/L (ref 96–112)
Creatinine, Ser: 0.8 mg/dL (ref 0.40–1.50)
GFR: 75.63 mL/min (ref >60.00)
Glucose, Bld: 155 mg/dL — ABNORMAL HIGH (ref 70–99)
Potassium: 3.9 mEq/L (ref 3.5–5.1)
Sodium: 135 mEq/L (ref 135–145)

## 2023-01-11 NOTE — Patient Instructions (Signed)
Please continue all other medications as before, and refills have been done if requested.  Please have the pharmacy call with any other refills you may need.  Please continue your efforts at being more active, low cholesterol diet, and weight control.  Please keep your appointments with your specialists as you may have planned - cardiology soon  Please go to the LAB at the blood drawing area for the tests to be done  You will be contacted by phone if any changes need to be made immediately.  Otherwise, you will receive a letter about your results with an explanation, but please check with MyChart first.

## 2023-01-11 NOTE — Progress Notes (Signed)
The test results show that your current treatment is OK, as the tests are stable.  Please continue the same plan.  There is no other need for change of treatment or further evaluation based on these results, at this time.  thanks 

## 2023-01-11 NOTE — Progress Notes (Signed)
Patient ID: Jason Barton, male   DOB: 1925-07-12, 89 y.o.   MRN: [id number]        Chief Complaint: follow up labile HTN, constipation, low platelets, copd, , hyperglycemia, hld, low thryoid       HPI:  Jason Barton is a 89 y.o. male here overall doing well.  Pt denies chest pain, increased sob or doe, wheezing, orthopnea, PND, increased LE swelling, palpitations, dizziness or syncope.   Pt denies polydipsia, polyuria, or new focal neuro s/s.    Pt denies fever, wt loss, night sweats, loss of appetite, or other constitutional symptoms  Denies worsening reflux, abd pain, dysphagia, n/v, or blood but has had mild worsening consipation recently despite diet change and activity.         Wt Readings from Last 3 Encounters:  01/11/23 130 lb (59 kg)  01/04/23 131 lb 2.8 oz (59.5 kg)  10/14/22 129 lb (58.5 kg)   BP Readings from Last 3 Encounters:  01/11/23 118/70  01/08/23 137/81  10/14/22 124/62         Past Medical History:  Diagnosis Date   COPD (chronic obstructive pulmonary disease) (HCC) 05/21/2011   Diverticulosis of colon 05/21/2011   Hypothyroidism    Impaired glucose tolerance 06/08/2012   Increased prostate specific antigen (PSA) velocity 05/24/2011   Nonmelanoma skin cancer 05/21/2011   S/P appendectomy 05/21/2011   S/P inguinal hernia repair 05/21/2011   S/p small bowel obstruction 05/21/2011   Thoracic scoliosis 05/21/2011   Past Surgical History:  Procedure Laterality Date   APPENDECTOMY  1939   COLON RESECTION     2009 for a "kink in colon" adhesions after appendectomy   SHOULDER SURGERY     dr sypher; rotater cuff   STRABISMUS SURGERY Right 11/21/2014   Procedure: REPAIR STRABISMUS RIGHT EYE ;  Surgeon: Verne Carrow, MD;  Location: [facility];  Service: Ophthalmology;  Laterality: Right;   TONSILLECTOMY  1942    reports that he has never smoked. He has never used smokeless tobacco. He reports current alcohol use. He reports that he does not use  drugs. family history includes Arthritis in an other family member; Sudden death in an other family member. Allergies  Allergen Reactions   Augmentin [Amoxicillin-Pot Clavulanate] Rash    Itching rash to all torso 2 days after start augmentin 12/2017   Peanut-Containing Drug Products Rash   Current Outpatient Medications on File Prior to Visit  Medication Sig Dispense Refill   alfuzosin (UROXATRAL) 10 MG 24 hr tablet Take 1 tablet (10 mg total) by mouth daily with breakfast. 90 tablet 1   Ascorbic Acid (VITAMIN C WITH ROSE HIPS) 500 MG tablet Take 500 mg by mouth daily.     ASPIRIN LOW DOSE 81 MG tablet TAKE 1 TABLET BY MOUTH DAILY. SWALLOW WHOLE. (Patient taking differently: Take 81 mg by mouth daily.) 100 tablet PRN   Cholecalciferol (VITAMIN D PO) Take 5,000 Units by mouth every morning.     dexamethasone (DECADRON) 6 MG tablet Take 1 tablet (6 mg total) by mouth daily for 6 days. 6 tablet 0   guaiFENesin-dextromethorphan (ROBITUSSIN DM) 100-10 MG/5ML syrup Take 5 mLs by mouth every 4 (four) hours as needed for cough. 118 mL 0   levothyroxine (SYNTHROID) 50 MCG tablet TAKE 1 TABLET BY MOUTH EVERY DAY BEFORE BREAKFAST (Patient taking differently: Take 50 mcg by mouth daily before breakfast.) 90 tablet 3   polyethylene glycol (MIRALAX / GLYCOLAX) 17 g packet Take 17 g  by mouth daily.     rosuvastatin (CRESTOR) 10 MG tablet TAKE 1 TABLET BY MOUTH EVERY DAY 90 tablet 3   selenium 50 MCG TABS tablet Take 50 mcg by mouth daily.     thiamine 50 MG tablet Take 1 tablet (50 mg total) by mouth daily. 90 tablet 1   Zinc 50 MG TABS Take by mouth.     No current facility-administered medications on file prior to visit.        ROS:  All others reviewed and negative.  Objective        PE:  BP 118/70 (BP Location: Right Arm, Patient Position: Sitting, Cuff Size: Normal)   Pulse 60   Temp 97.9 F (36.6 C) (Oral)   Ht 5\' 7"  (1.702 m)   Wt 130 lb (59 kg)   SpO2 98%   BMI 20.36 kg/m                  Constitutional: Pt appears in NAD               HENT: Head: NCAT.                Right Ear: External ear normal.                 Left Ear: External ear normal.                Eyes: . Pupils are equal, round, and reactive to light. Conjunctivae and EOM are normal               Nose: without d/c or deformity               Neck: Neck supple. Gross normal ROM               Cardiovascular: Normal rate and regular rhythm.                 Pulmonary/Chest: Effort normal and breath sounds without rales or wheezing.                Abd:  Soft, NT, ND, + BS, no organomegaly               Neurological: Pt is alert. At baseline orientation, motor grossly intact               Skin: Skin is warm. No rashes, no other new lesions, LE edema - none               Psychiatric: Pt behavior is normal without agitation   Micro: none  Cardiac tracings I have personally interpreted today:  none  Pertinent Radiological findings (summarize): none   Lab Results  Component Value Date   WBC 7.3 01/11/2023   HGB 14.0 01/11/2023   HCT 42.7 01/11/2023   PLT 134.0 (L) 01/11/2023   GLUCOSE 155 (H) 01/11/2023   CHOL 109 07/12/2022   TRIG 58.0 07/12/2022   HDL 58.20 07/12/2022   LDLCALC 39 07/12/2022   ALT 21 01/06/2023   AST 29 01/06/2023   NA 135 01/11/2023   K 3.9 01/11/2023   CL 101 01/11/2023   CREATININE 0.80 01/11/2023   BUN 20 01/11/2023   CO2 26 01/11/2023   TSH 1.273 01/05/2023   INR 1.1 01/04/2023   HGBA1C 5.6 07/12/2022   Assessment/Plan:  Jason Barton is a 89 y.o. White or Caucasian [1] male with  has a past medical history of COPD (chronic obstructive pulmonary disease) (  HCC) (05/21/2011), Diverticulosis of colon (05/21/2011), Hypothyroidism, Impaired glucose tolerance (06/08/2012), Increased prostate specific antigen (PSA) velocity (05/24/2011), Nonmelanoma skin cancer (05/21/2011), S/P appendectomy (05/21/2011), S/P inguinal hernia repair (05/21/2011), S/p small bowel obstruction (05/21/2011),  and Thoracic scoliosis (05/21/2011).  COPD (chronic obstructive pulmonary disease) (HCC) Stable overall, to continue inhaler prn  Chronic idiopathic constipation With mild worsening recently, for contd miralax daily, also add senakot qhs  Hyperlipidemia Lab Results  Component Value Date   LDLCALC 39 07/12/2022   Stable, pt to continue current statin crestor 10 qd   Hypothyroidism Lab Results  Component Value Date   TSH 1.273 01/05/2023   Stable, pt to continue levothyroxine 50 mcg qd   Impaired glucose tolerance Lab Results  Component Value Date   HGBA1C 5.6 07/12/2022   Stable, pt to continue current medical treatment  - diet, wt control   Thrombocytopenia (HCC) Mild worsening recent, for f/u lab today  Followup: Return in about 3 months (around 04/13/2023).  Oliver Barre, MD 01/14/2023 1:06 PM [facility]

## 2023-01-12 ENCOUNTER — Ambulatory Visit: Payer: Medicare Other | Admitting: Internal Medicine

## 2023-01-14 ENCOUNTER — Encounter: Payer: Self-pay | Admitting: Internal Medicine

## 2023-01-14 DIAGNOSIS — D696 Thrombocytopenia, unspecified: Secondary | ICD-10-CM | POA: Insufficient documentation

## 2023-01-14 NOTE — Assessment & Plan Note (Signed)
Lab Results  Component Value Date   TSH 1.273 01/05/2023   Stable, pt to continue levothyroxine 50 mcg qd

## 2023-01-14 NOTE — Assessment & Plan Note (Signed)
Lab Results  Component Value Date   LDLCALC 39 07/12/2022   Stable, pt to continue current statin crestor 10 qd

## 2023-01-14 NOTE — Assessment & Plan Note (Signed)
Mild worsening recent, for f/u lab today

## 2023-01-14 NOTE — Assessment & Plan Note (Signed)
With mild worsening recently, for contd miralax daily, also add senakot qhs

## 2023-01-14 NOTE — Assessment & Plan Note (Signed)
Stable overall, to continue inhaler prn °

## 2023-01-14 NOTE — Assessment & Plan Note (Signed)
Lab Results  Component Value Date   HGBA1C 5.6 07/12/2022   Stable, pt to continue current medical treatment  - diet, wt control

## 2023-01-22 NOTE — Progress Notes (Deleted)
Cardiology Office Note:    Date:  01/22/2023   ID:  Octaviano, Mukai 07-Jul-1925, MRN [id number]  PCP:  Corwin Levins, MD  Cardiologist:  Olga Millers, MD  Electrophysiologist:  None   Referring MD: Corwin Levins, MD   Chief Complaint: follow-up of bradycardia  History of Present Illness:    Jason Barton is a 89 y.o. male with a history of coronary calciifications noted on chest CT in 12/2022, aortic atherosclerosis, bradycardia, hyperlipidemia, possible TIA in 09/2020, COPD, hypothyroidism, chronic thrombocytopenia, bladder outlet obstruction, and dementia  who is followed by Dr. Jens Som and presents today for hospital follow-up of bradycardia.  Patient has been followed by Dr. Jens Som since 12/2020 for bradycardia. Monitor in 10/2020 after a possible TIA showed underlying sinus bradycardia with average heart rate of 55 bpm and 9% ventricular ectopy but no concerning arrhythmias. Echo at that time showed LVEF of 60-65% with normal wall motion and grade 1 diastolic dysfunction, normal RV, and mild dilatation of the aortic root and ascending aorta measuring 41 mm and 42 mm respectively. He has been asymptomatic with his bradycardia.   He was recently admitted from 01/04/2023 to 01/08/2023 for acute hypoxic respiratory failure secondary to COVID-19 infection and suspected aspiration pneumonia after presenting with lethargy, fever, and 2 clearly mechanical falls. Cardiology was consulted for bradycardia. He denied any specific cardiac symptoms. His bradycardia on telemetry looked consistent with his baseline and there was no need for pacemaker.   Patient presents today for follow-up. ***  Bradycardia  Patient has a history of asymptomatic bradycardia. Monitor in 10/2020 showed sinus bradycardia with average heart rate of 55 bpm and 9% ventricular ectopy but no concerning arrhythmias. Echo showed normal LV function at that time. He was recently admitted for acute hypoxic respiratory  failure secondary to COVID-19 infection and suspected aspiration pneumonia. Cardiology was consulted for bradycardia but he was felt to be at his baseline and continued to be asymptomatic with this. - *** - Continue to avoid AV nodal agents.  - No indication for pacemaker at this time.   Coronary Calcification  Aortic Atherosclerosis Noted on recent CT in 12/2022.  - No chest pain.  - Continue aspirin and statin.   Hyperlipidemia Lipid panel in 06/2022: Total Cholesterol 109, Triglycerides 58, HDL 58, LDL 39. - Continue Crestor 10mg  daily.  Possible TIA He has a history of possible TIA in 09/2020 and 06/2021. CTA in 06/2021 showed no large vessel occlusion in head or neck but a short-segment moderate stenosis at the supraclinoid left ICA.  - Continue aspirin and statin.   EKGs/Labs/Other Studies Reviewed:    The following studies were reviewed:  Echocardiogram 10/20/2020: Impressions: 1. Left ventricular ejection fraction, by estimation, is 60 to 65%. The  left ventricle has normal function. The left ventricle has no regional  wall motion abnormalities. Left ventricular diastolic parameters are  consistent with Grade I diastolic  dysfunction (impaired relaxation).   2. Right ventricular systolic function is normal. The right ventricular  size is normal. There is normal pulmonary artery systolic pressure. The  estimated right ventricular systolic pressure is 18.7 mmHg.   3. The mitral valve is grossly normal. Trivial mitral valve  regurgitation. No evidence of mitral stenosis.   4. The aortic valve is tricuspid. There is mild calcification of the  aortic valve. Aortic valve regurgitation is mild. Mild aortic valve  sclerosis is present, with no evidence of aortic valve stenosis.   5. Aortic dilatation noted. There  is mild dilatation of the aortic root,  measuring 41 mm. There is mild dilatation of the aortic root and of the  ascending aorta, measuring 42 mm.   6. The inferior vena  cava is normal in size with greater than 50%  respiratory variability, suggesting right atrial pressure of 3 mmHg.  _______________   Monitor 10/2020: Predominant rhythm was sinus rhythm 9% ventricular ectopy No symptoms recorded No prolonged arrhythmias noted  EKG:  EKG not ordered today.   Recent Labs: 01/05/2023: B Natriuretic Peptide 797.0; TSH 1.273 01/06/2023: ALT 21 01/08/2023: Magnesium 2.0 01/11/2023: BUN 20; Creatinine, Ser 0.80; Hemoglobin 14.0; Platelets 134.0; Potassium 3.9; Sodium 135  Recent Lipid Panel    Component Value Date/Time   CHOL 109 07/12/2022 1332   TRIG 58.0 07/12/2022 1332   HDL 58.20 07/12/2022 1332   CHOLHDL 2 07/12/2022 1332   VLDL 11.6 07/12/2022 1332   LDLCALC 39 07/12/2022 1332    Physical Exam:    Vital Signs: There were no vitals taken for this visit.    Wt Readings from Last 3 Encounters:  01/11/23 130 lb (59 kg)  01/04/23 131 lb 2.8 oz (59.5 kg)  10/14/22 129 lb (58.5 kg)     General: 89 y.o. male in no acute distress. HEENT: Normocephalic and atraumatic. Sclera clear. EOMs intact. Neck: Supple. No carotid bruits. No JVD. Heart: *** RRR. Distinct S1 and S2. No murmurs, gallops, or rubs. Radial and distal pedal pulses 2+ and equal bilaterally. Lungs: No increased work of breathing. Clear to ausculation bilaterally. No wheezes, rhonchi, or rales.  Abdomen: Soft, non-distended, and non-tender to palpation. Bowel sounds present in all 4 quadrants.  MSK: Normal strength and tone for age. *** Extremities: No lower extremity edema.    Skin: Warm and dry. Neuro: Alert and oriented x3. No focal deficits. Psych: Normal affect. Responds appropriately.   Assessment:    No diagnosis found.  Plan:     Disposition: Follow up in ***   Medication Adjustments/Labs and Tests Ordered: Current medicines are reviewed at length with the patient today.  Concerns regarding medicines are outlined above.  No orders of the defined types were placed  in this encounter.  No orders of the defined types were placed in this encounter.   There are no Patient Instructions on file for this visit.   Signed, Corrin Parker, PA-C  01/22/2023 10:29 AM    [facility]

## 2023-01-24 ENCOUNTER — Ambulatory Visit: Payer: Medicare Other | Admitting: Student

## 2023-02-11 NOTE — Progress Notes (Unsigned)
Cardiology Office Note:    Date:  02/14/2023   ID:  Chans, Ewert [birth year], MRN [id number]  PCP:  Corwin Levins, MD  Cardiologist:  Olga Millers, MD  Electrophysiologist:  None   Referring MD: Corwin Levins, MD   Chief Complaint: hospital follow-up of bradycardia   History of Present Illness:    Jason Barton is a 89 y.o. male with a history of coronary calciifications noted on chest CT in 12/2022, aortic atherosclerosis, bradycardia, hyperlipidemia, possible TIA in 09/2020, COPD, hypothyroidism, chronic thrombocytopenia, bladder outlet obstruction, and dementia  who is followed by Dr. Jens Som and presents today for hospital follow-up of bradycardia.  Patient has been followed by Dr. Jens Som since 12/2020 for bradycardia. Monitor in 10/2020 after a possible TIA showed underlying sinus bradycardia with average heart rate of 55 bpm and 9% ventricular ectopy but no concerning arrhythmias. Echo at that time showed LVEF of 60-65% with normal wall motion and grade 1 diastolic dysfunction, normal RV, and mild dilatation of the aortic root and ascending aorta measuring 41 mm and 42 mm respectively. He has been asymptomatic with his bradycardia.   He was recently admitted from 01/04/2023 to 01/08/2023 for acute hypoxic respiratory failure secondary to COVID-19 infection and suspected aspiration pneumonia after presenting with lethargy, fever, and 2 clearly mechanical falls. Cardiology was consulted for bradycardia. He denied any specific cardiac symptoms. Telemetry showed sinus rhythm with rates mostly in the 50s (occasionally in the 40s). However, his heart rate would increase to the 70s to 80s with minimal activity (such as moving around in bed). Telemetry also showed frequent PACs/ PVCs with some short compensatory pauses; however, there was no significant bradycardia or high grade AV block. His bradycardia on telemetry was felt to be consistent with his baseline and there was no need  for a pacemaker.   Patient presents today for follow-up. Here with his daughter. He is doing well since discharge. No chest pain, shortness of breath, orthopnea, PND, lower extremity edema, palpitations, lightheadedness, dizziness, or syncope.   EKGs/Labs/Other Studies Reviewed:    The following studies were reviewed:  Echocardiogram 10/20/2020: Impressions: 1. Left ventricular ejection fraction, by estimation, is 60 to 65%. The  left ventricle has normal function. The left ventricle has no regional  wall motion abnormalities. Left ventricular diastolic parameters are  consistent with Grade I diastolic  dysfunction (impaired relaxation).   2. Right ventricular systolic function is normal. The right ventricular  size is normal. There is normal pulmonary artery systolic pressure. The  estimated right ventricular systolic pressure is 18.7 mmHg.   3. The mitral valve is grossly normal. Trivial mitral valve  regurgitation. No evidence of mitral stenosis.   4. The aortic valve is tricuspid. There is mild calcification of the  aortic valve. Aortic valve regurgitation is mild. Mild aortic valve  sclerosis is present, with no evidence of aortic valve stenosis.   5. Aortic dilatation noted. There is mild dilatation of the aortic root,  measuring 41 mm. There is mild dilatation of the aortic root and of the  ascending aorta, measuring 42 mm.   6. The inferior vena cava is normal in size with greater than 50%  respiratory variability, suggesting right atrial pressure of 3 mmHg.  _______________   Monitor 10/2020: Predominant rhythm was sinus rhythm 9% ventricular ectopy No symptoms recorded No prolonged arrhythmias noted  EKG:  EKG ordered today. EKG personally reviewed and demonstrates normal sinus rhythm, rate 70 bpm, with non-specific ST/T wave  changes. Normal axis. Normal PR and QRS intervals. QTc 440 ms.  Recent Labs: 01/05/2023: B Natriuretic Peptide 797.0; TSH 1.273 01/06/2023: ALT  21 01/08/2023: Magnesium 2.0 01/11/2023: BUN 20; Creatinine, Ser 0.80; Hemoglobin 14.0; Platelets 134.0; Potassium 3.9; Sodium 135  Recent Lipid Panel    Component Value Date/Time   CHOL 109 07/12/2022 1332   TRIG 58.0 07/12/2022 1332   HDL 58.20 07/12/2022 1332   CHOLHDL 2 07/12/2022 1332   VLDL 11.6 07/12/2022 1332   LDLCALC 39 07/12/2022 1332    Physical Exam:    Vital Signs: BP 112/76   Pulse 67   Ht 5\' 8"  (1.727 m)   Wt 122 lb (55.3 kg)   SpO2 93%   BMI 18.55 kg/m     Wt Readings from Last 3 Encounters:  02/14/23 122 lb (55.3 kg)  01/11/23 130 lb (59 kg)  01/04/23 131 lb 2.8 oz (59.5 kg)     General: 89 y.o. thin Caucasian male in no acute distress. HEENT: Normocephalic and atraumatic. Sclera clear.  Neck: Supple. No JVD. Heart: Irregular rhythm with normal rate. Distinct S1 and S2. No murmurs, gallops, or rubs. Radial pulses 2+ and equal bilaterally. Lungs: No increased work of breathing. Clear to ausculation bilaterally. No wheezes, rhonchi, or rales.  Abdomen: Soft, non-distended, and non-tender to palpation.  Extremities: No lower extremity edema.    Skin: Warm and dry. Neuro: No focal deficits. Psych: Normal affect. Responds appropriately.  Assessment:    1. Bradycardia   2. Coronary artery calcification   3. Aortic atherosclerosis (HCC)   4. Hyperlipidemia, unspecified hyperlipidemia type   5. Possible TIA     Plan:    Bradycardia  Frequent PVCs  Patient has a history of asymptomatic bradycardia. Monitor in 10/2020 showed sinus bradycardia with average heart rate of 55 bpm and 9% ventricular ectopy but no concerning arrhythmias. Echo showed normal LV function at that time. He was recently admitted for acute hypoxic respiratory failure secondary to COVID-19 infection and suspected aspiration pneumonia. Cardiology was consulted for bradycardia. Telemetry was reviewed and he was felt to be at his baseline. No additional evaluation/ management was felt to be  necessary.  - EKG today shows normal sinus rhythm with rate of 70 bpm. He did have occasional ectopy note on exam consistent with known PACs/ PVCs. - Completely asymptomatic.  - Continue to avoid AV nodal agents.  - No indication for pacemaker at this time.   Coronary Artery Calcifications Aortic Atherosclerosis Noted on recent CT in 12/2022.  - No chest pain.  - Continue aspirin and statin.   Hyperlipidemia Lipid panel in 06/2022: Total Cholesterol 109, Triglycerides 58, HDL 58, LDL 39. - Continue Crestor 10mg  daily. - Labs followed by PCP.  Possible TIA He has a history of possible TIA in 09/2020 and 06/2021. CTA in 06/2021 showed no large vessel occlusion in head or neck but a short-segment moderate stenosis at the supraclinoid left ICA.  - Continue aspirin and statin.   Disposition: Follow up in 6 months.   Medication Adjustments/Labs and Tests Ordered: Current medicines are reviewed at length with the patient today.  Concerns regarding medicines are outlined above.  Orders Placed This Encounter  Procedures   EKG 12-Lead   No orders of the defined types were placed in this encounter.   Patient Instructions  Medication Instructions:  Your physician recommends that you continue on your current medications as directed. Please refer to the Current Medication list given to you today.  *If you  need a refill on your cardiac medications before your next appointment, please call your pharmacy*   Lab Work: NONE If you have labs (blood work) drawn today and your tests are completely normal, you will receive your results only by: MyChart Message (if you have MyChart) OR A paper copy in the mail If you have any lab test that is abnormal or we need to change your treatment, we will call you to review the results.   Testing/Procedures: NONE   Follow-Up: At [facility], you and your health needs are our priority.  As part of our continuing mission to provide you with  exceptional heart care, we have created designated Provider Care Teams.  These Care Teams include your primary Cardiologist (physician) and Advanced Practice Providers (APPs -  Physician Assistants and Nurse Practitioners) who all work together to provide you with the care you need, when you need it.  We recommend signing up for the patient portal called "MyChart".  Sign up information is provided on this After Visit Summary.  MyChart is used to connect with patients for Virtual Visits (Telemedicine).  Patients are able to view lab/test results, encounter notes, upcoming appointments, etc.  Non-urgent messages can be sent to your provider as well.   To learn more about what you can do with MyChart, go to [email or web address].    Your next appointment:   6 month(s)  Provider:   Olga Millers, MD  or Marjie Skiff, PA-C       Signed, Corrin Parker, PA-C  02/14/2023 4:23 PM    [facility]

## 2023-02-14 ENCOUNTER — Encounter: Payer: Self-pay | Admitting: Student

## 2023-02-14 ENCOUNTER — Ambulatory Visit: Payer: Medicare Other | Admitting: Student

## 2023-02-14 VITALS — BP 112/76 | HR 67 | Ht 68.0 in | Wt 122.0 lb

## 2023-02-14 DIAGNOSIS — E785 Hyperlipidemia, unspecified: Secondary | ICD-10-CM

## 2023-02-14 DIAGNOSIS — R001 Bradycardia, unspecified: Secondary | ICD-10-CM

## 2023-02-14 DIAGNOSIS — G459 Transient cerebral ischemic attack, unspecified: Secondary | ICD-10-CM

## 2023-02-14 DIAGNOSIS — I2584 Coronary atherosclerosis due to calcified coronary lesion: Secondary | ICD-10-CM

## 2023-02-14 DIAGNOSIS — I700 Atherosclerosis of aorta: Secondary | ICD-10-CM

## 2023-02-14 DIAGNOSIS — I2510 Atherosclerotic heart disease of native coronary artery without angina pectoris: Secondary | ICD-10-CM | POA: Diagnosis not present

## 2023-02-14 NOTE — Patient Instructions (Signed)
Medication Instructions:  Your physician recommends that you continue on your current medications as directed. Please refer to the Current Medication list given to you today.  *If you need a refill on your cardiac medications before your next appointment, please call your pharmacy*   Lab Work: NONE If you have labs (blood work) drawn today and your tests are completely normal, you will receive your results only by: MyChart Message (if you have MyChart) OR A paper copy in the mail If you have any lab test that is abnormal or we need to change your treatment, we will call you to review the results.   Testing/Procedures: NONE   Follow-Up: At [facility], you and your health needs are our priority.  As part of our continuing mission to provide you with exceptional heart care, we have created designated Provider Care Teams.  These Care Teams include your primary Cardiologist (physician) and Advanced Practice Providers (APPs -  Physician Assistants and Nurse Practitioners) who all work together to provide you with the care you need, when you need it.  We recommend signing up for the patient portal called "MyChart".  Sign up information is provided on this After Visit Summary.  MyChart is used to connect with patients for Virtual Visits (Telemedicine).  Patients are able to view lab/test results, encounter notes, upcoming appointments, etc.  Non-urgent messages can be sent to your provider as well.   To learn more about what you can do with MyChart, go to [email or web address].    Your next appointment:   6 month(s)  Provider:   Olga Millers, MD  or Marjie Skiff, PA-C

## 2023-03-02 ENCOUNTER — Other Ambulatory Visit: Payer: Self-pay | Admitting: Internal Medicine

## 2023-03-02 ENCOUNTER — Other Ambulatory Visit: Payer: Self-pay

## 2023-03-09 ENCOUNTER — Other Ambulatory Visit: Payer: Self-pay

## 2023-03-09 ENCOUNTER — Other Ambulatory Visit: Payer: Self-pay | Admitting: Internal Medicine

## 2023-05-05 ENCOUNTER — Telehealth (INDEPENDENT_AMBULATORY_CARE_PROVIDER_SITE_OTHER): Payer: Medicare Other | Admitting: Internal Medicine

## 2023-05-05 ENCOUNTER — Encounter: Payer: Self-pay | Admitting: Internal Medicine

## 2023-05-05 DIAGNOSIS — R030 Elevated blood-pressure reading, without diagnosis of hypertension: Secondary | ICD-10-CM | POA: Diagnosis not present

## 2023-05-05 DIAGNOSIS — J019 Acute sinusitis, unspecified: Secondary | ICD-10-CM

## 2023-05-05 DIAGNOSIS — J449 Chronic obstructive pulmonary disease, unspecified: Secondary | ICD-10-CM | POA: Diagnosis not present

## 2023-05-05 DIAGNOSIS — R7302 Impaired glucose tolerance (oral): Secondary | ICD-10-CM

## 2023-05-05 MED ORDER — HYDROCODONE BIT-HOMATROP MBR 5-1.5 MG/5ML PO SOLN: 5.0000 mL | Freq: Four times a day (QID) | ORAL | 0 refills | Status: AC | PRN

## 2023-05-05 MED ORDER — DOXYCYCLINE HYCLATE 100 MG PO TABS: 100.0000 mg | ORAL_TABLET | Freq: Two times a day (BID) | ORAL | 0 refills | Status: DC

## 2023-05-05 NOTE — Telephone Encounter (Signed)
Ok for 430 or 445 appt today virtuel - thanks

## 2023-05-05 NOTE — Telephone Encounter (Signed)
Pt daughter just called back and mentioned she wont be able to do a office visit "she wont get out", "can it be virtual instead if possible" ? Can she do mychart appt or do he recommend a in person appt? Please advise, if you have any other questions or concerns pt cb is below; Cb # [phone] Lacy Duverney (daughter)

## 2023-05-05 NOTE — Progress Notes (Unsigned)
Patient ID: Jason Barton, male   DOB: 05-07-1926, 89 y.o.   MRN: [id number]  Virtual Visit via Video Note  I connected with Jason Barton on 05/05/23 at  4:30 PM EST by a video enabled telemedicine application and verified that I am speaking with the correct person using two identifiers.  Location: Patient: *** Provider: ***   I discussed the limitations of evaluation and management by telemedicine and the availability of in person appointments. The patient expressed understanding and agreed to proceed.  History of Present Illness:    Observations/Objective:   Assessment and Plan:   Follow Up Instructions:    I discussed the assessment and treatment plan with the patient. The patient was provided an opportunity to ask questions and all were answered. The patient agreed with the plan and demonstrated an understanding of the instructions.   The patient was advised to call back or seek an in-person evaluation if the symptoms worsen or if the condition fails to improve as anticipated.   Oliver Barre, MD

## 2023-05-05 NOTE — Telephone Encounter (Signed)
Can you help [place] with this if possible? Thanks

## 2023-05-07 ENCOUNTER — Encounter: Payer: Self-pay | Admitting: Internal Medicine

## 2023-05-07 DIAGNOSIS — J019 Acute sinusitis, unspecified: Secondary | ICD-10-CM | POA: Insufficient documentation

## 2023-05-07 NOTE — Assessment & Plan Note (Signed)
Lab Results  Component Value Date   HGBA1C 5.6 07/12/2022   Stable, pt to continue current medical treatment  - diet, wt control

## 2023-05-07 NOTE — Assessment & Plan Note (Signed)
Mild to mod, for antibx course doxycycline 100 bid, and hycodan prn,  to f/u any worsening symptoms or concerns

## 2023-05-07 NOTE — Assessment & Plan Note (Signed)
Stable overall, cont inhaler prn 

## 2023-05-07 NOTE — Assessment & Plan Note (Signed)
BP Readings from Last 3 Encounters:  02/14/23 112/76  01/11/23 118/70  01/08/23 137/81   Pt to continue to monitor BP at home on regular basis, for low sodium diet

## 2023-05-07 NOTE — Patient Instructions (Signed)
Please take all new medication as prescribed 

## 2023-07-11 ENCOUNTER — Encounter: Payer: Self-pay | Admitting: Internal Medicine

## 2023-07-11 ENCOUNTER — Ambulatory Visit: Payer: Medicare Other | Admitting: Internal Medicine

## 2023-07-11 VITALS — BP 122/74 | HR 51 | Temp 97.8°F | Ht 68.0 in | Wt 125.0 lb

## 2023-07-11 DIAGNOSIS — K432 Incisional hernia without obstruction or gangrene: Secondary | ICD-10-CM | POA: Diagnosis not present

## 2023-07-11 DIAGNOSIS — Z23 Encounter for immunization: Secondary | ICD-10-CM | POA: Diagnosis not present

## 2023-07-11 DIAGNOSIS — Z0000 Encounter for general adult medical examination without abnormal findings: Secondary | ICD-10-CM | POA: Diagnosis not present

## 2023-07-11 DIAGNOSIS — E039 Hypothyroidism, unspecified: Secondary | ICD-10-CM | POA: Diagnosis not present

## 2023-07-11 DIAGNOSIS — E7800 Pure hypercholesterolemia, unspecified: Secondary | ICD-10-CM | POA: Diagnosis not present

## 2023-07-11 DIAGNOSIS — Z0001 Encounter for general adult medical examination with abnormal findings: Secondary | ICD-10-CM

## 2023-07-11 DIAGNOSIS — R7302 Impaired glucose tolerance (oral): Secondary | ICD-10-CM | POA: Diagnosis not present

## 2023-07-11 NOTE — Patient Instructions (Signed)
You had the flu shot today  Please continue all other medications as before, and refills have been done if requested.  Please have the pharmacy call with any other refills you may need.  Please continue your efforts at being more active, low cholesterol diet, and weight control.  You are otherwise up to date with prevention measures today.  Please keep your appointments with your specialists as you may have planned  Please go to the LAB at the blood drawing area for the tests to be done  You will be contacted by phone if any changes need to be made immediately.  Otherwise, you will receive a letter about your results with an explanation, but please check with MyChart first.  Please make an Appointment to return in 6 months, or sooner if needed

## 2023-07-11 NOTE — Progress Notes (Signed)
Patient ID: Jason Barton, male   DOB: 04-06-1926, 89 y.o.   MRN: [id number]         Chief Complaint:: wellness exam and incisional hernia, hld, hyperglycemia, low thyroid, copd       HPI:  Jason Barton is a 89 y.o. male here for wellness exam; for shingrix and tdap at pharmacy, ow/ up to date; due for flu shot                        Also Pt denies chest pain, increased sob or doe, wheezing, orthopnea, PND, increased LE swelling, palpitations, dizziness or syncope.   Pt denies polydipsia, polyuria, or new focal neuro s/s.    Pt denies fever, wt loss, night sweats, loss of appetite, or other constitutional symptoms  Denies hyper or hypo thyroid symptoms such as voice, skin or hair change.   Wt Readings from Last 3 Encounters:  07/11/23 125 lb (56.7 kg)  02/14/23 122 lb (55.3 kg)  01/11/23 130 lb (59 kg)   BP Readings from Last 3 Encounters:  07/11/23 122/74  02/14/23 112/76  01/11/23 118/70   Immunization History  Administered Date(s) Administered   Fluad Trivalent(High Dose 65+) 07/11/2023   Influenza, Seasonal, Injecte, Preservative Fre 07/17/2012   PFIZER(Purple Top)SARS-COV-2 Vaccination 07/26/2019, 08/20/2019, 04/15/2020   Pneumococcal Conjugate-13 06/11/2013   Pneumococcal Polysaccharide-23 08/17/2017   Zoster, Live 07/24/2014   Health Maintenance Due  Topic Date Due   DTaP/Tdap/Td (1 - Tdap) Never done   Zoster Vaccines- Shingrix (1 of 2) 04/17/1947   Medicare Annual Wellness (AWV)  04/20/2023      Past Medical History:  Diagnosis Date   COPD (chronic obstructive pulmonary disease) (HCC) 05/21/2011   Diverticulosis of colon 05/21/2011   Hypothyroidism    Impaired glucose tolerance 06/08/2012   Increased prostate specific antigen (PSA) velocity 05/24/2011   Nonmelanoma skin cancer 05/21/2011   S/P appendectomy 05/21/2011   S/P inguinal hernia repair 05/21/2011   S/p small bowel obstruction 05/21/2011   Thoracic scoliosis 05/21/2011   Past Surgical History:   Procedure Laterality Date   APPENDECTOMY  1939   COLON RESECTION     2009 for a "kink in colon" adhesions after appendectomy   SHOULDER SURGERY     dr sypher; rotater cuff   STRABISMUS SURGERY Right 11/21/2014   Procedure: REPAIR STRABISMUS RIGHT EYE ;  Surgeon: Verne Carrow, MD;  Location: [facility];  Service: Ophthalmology;  Laterality: Right;   TONSILLECTOMY  1942    reports that he has never smoked. He has never used smokeless tobacco. He reports current alcohol use. He reports that he does not use drugs. family history includes Arthritis in an other family member; Sudden death in an other family member. Allergies  Allergen Reactions   Augmentin [Amoxicillin-Pot Clavulanate] Rash    Itching rash to all torso 2 days after start augmentin 12/2017   Peanut-Containing Drug Products Rash   Current Outpatient Medications on File Prior to Visit  Medication Sig Dispense Refill   alfuzosin (UROXATRAL) 10 MG 24 hr tablet Take 10 mg by mouth as needed (prostate).     Ascorbic Acid (VITAMIN C WITH ROSE HIPS) 500 MG tablet Take 500 mg by mouth daily.     aspirin EC 81 MG tablet TAKE 1 TABLET BY MOUTH DAILY. SWALLOW WHOLE. 120 tablet PRN   Cholecalciferol (VITAMIN D PO) Take 5,000 Units by mouth every morning.     doxycycline (VIBRA-TABS) 100 MG tablet  Take 1 tablet (100 mg total) by mouth 2 (two) times daily. 20 tablet 0   guaiFENesin-dextromethorphan (ROBITUSSIN DM) 100-10 MG/5ML syrup Take 5 mLs by mouth every 4 (four) hours as needed for cough. 118 mL 0   levothyroxine (SYNTHROID) 50 MCG tablet TAKE 1 TABLET BY MOUTH EVERY DAY BEFORE BREAKFAST 90 tablet 3   polyethylene glycol (MIRALAX / GLYCOLAX) 17 g packet Take 17 g by mouth daily.     rosuvastatin (CRESTOR) 10 MG tablet TAKE 1 TABLET BY MOUTH EVERY DAY 90 tablet 3   selenium 50 MCG TABS tablet Take 50 mcg by mouth daily.     Zinc 50 MG TABS Take by mouth.     alfuzosin (UROXATRAL) 10 MG 24 hr tablet Take 1 tablet (10 mg  total) by mouth daily with breakfast. (Patient not taking: Reported on 02/14/2023) 90 tablet 1   thiamine 50 MG tablet Take 1 tablet (50 mg total) by mouth daily. (Patient not taking: Reported on 02/14/2023) 90 tablet 1   No current facility-administered medications on file prior to visit.        ROS:  All others reviewed and negative.  Objective        PE:  BP 122/74 (BP Location: Right Arm, Patient Position: Sitting, Cuff Size: Normal)   Pulse (!) 51   Temp 97.8 F (36.6 C) (Oral)   Ht 5\' 8"  (1.727 m)   Wt 125 lb (56.7 kg)   SpO2 98%   BMI 19.01 kg/m                 Constitutional: Pt appears in NAD               HENT: Head: NCAT.                Right Ear: External ear normal.                 Left Ear: External ear normal.                Eyes: . Pupils are equal, round, and reactive to light. Conjunctivae and EOM are normal               Nose: without d/c or deformity               Neck: Neck supple. Gross normal ROM               Cardiovascular: Normal rate and regular rhythm.                 Pulmonary/Chest: Effort normal and breath sounds without rales or wheezing.                Abd:  Soft, NT, ND, + BS, no organomegaly, has very small non tender reducible hernia suprapubic at the lowest aspect of the vertical previous surgical scar in the midline               Neurological: Pt is alert. At baseline orientation, motor grossly intact               Skin: Skin is warm. No rashes, no other new lesions, LE edema - none               Psychiatric: Pt behavior is normal without agitation   Micro: none  Cardiac tracings I have personally interpreted today:  none  Pertinent Radiological findings (summarize): none   Lab Results  Component Value Date   WBC  7.3 01/11/2023   HGB 14.0 01/11/2023   HCT 42.7 01/11/2023   PLT 134.0 (L) 01/11/2023   GLUCOSE 155 (H) 01/11/2023   CHOL 109 07/12/2022   TRIG 58.0 07/12/2022   HDL 58.20 07/12/2022   LDLCALC 39 07/12/2022   ALT 21  01/06/2023   AST 29 01/06/2023   NA 135 01/11/2023   K 3.9 01/11/2023   CL 101 01/11/2023   CREATININE 0.80 01/11/2023   BUN 20 01/11/2023   CO2 26 01/11/2023   TSH 1.273 01/05/2023   INR 1.1 01/04/2023   HGBA1C 5.6 07/12/2022   Assessment/Plan:  Jason Barton is a 89 y.o. White or Caucasian [1] male with  has a past medical history of COPD (chronic obstructive pulmonary disease) (HCC) (05/21/2011), Diverticulosis of colon (05/21/2011), Hypothyroidism, Impaired glucose tolerance (06/08/2012), Increased prostate specific antigen (PSA) velocity (05/24/2011), Nonmelanoma skin cancer (05/21/2011), S/P appendectomy (05/21/2011), S/P inguinal hernia repair (05/21/2011), S/p small bowel obstruction (05/21/2011), and Thoracic scoliosis (05/21/2011).  Encounter for well adult exam with abnormal findings Age and sex appropriate education and counseling updated with regular exercise and diet Referrals for preventative services - none needed Immunizations addressed - for flu shot, for tdap and shingrix at pharmacy Smoking counseling  - none needed Evidence for depression or other mood disorder - none significant Most recent labs reviewed. I have personally reviewed and have noted: 1) the patient's medical and social history 2) The patient's current medications and supplements 3) The patient's height, weight, and BMI have been recorded in the chart   Hyperlipidemia Lab Results  Component Value Date   LDLCALC 39 07/12/2022   Stable, pt to continue current statin crestor 10 qd   Hypothyroidism Lab Results  Component Value Date   TSH 1.273 01/05/2023   Stable, pt to continue levothyroxine 50 mcg qd    Impaired glucose tolerance Lab Results  Component Value Date   HGBA1C 5.6 07/12/2022   Stable, pt to continue current medical treatment  - diet,wt control   Incisional hernia Small , reducible, non tender, ok to follow  Followup: Return in about 6 months (around 01/08/2024).  Oliver Barre, MD 07/14/2023 9:35 PM [facility]

## 2023-07-14 ENCOUNTER — Encounter: Payer: Self-pay | Admitting: Internal Medicine

## 2023-07-14 NOTE — Assessment & Plan Note (Addendum)
Lab Results  Component Value Date   TSH 1.273 01/05/2023   Stable, pt to continue levothyroxine 50 mcg qd

## 2023-07-14 NOTE — Assessment & Plan Note (Signed)
Small , reducible, non tender, ok to follow

## 2023-07-14 NOTE — Assessment & Plan Note (Signed)
Lab Results  Component Value Date   HGBA1C 5.6 07/12/2022   Stable, pt to continue current medical treatment  - diet,wt control

## 2023-07-14 NOTE — Assessment & Plan Note (Signed)
Age and sex appropriate education and counseling updated with regular exercise and diet Referrals for preventative services - none needed Immunizations addressed - for flu shot, for tdap and shingrix at pharmacy Smoking counseling  - none needed Evidence for depression or other mood disorder - none significant Most recent labs reviewed. I have personally reviewed and have noted: 1) the patient's medical and social history 2) The patient's current medications and supplements 3) The patient's height, weight, and BMI have been recorded in the chart

## 2023-07-14 NOTE — Assessment & Plan Note (Signed)
Lab Results  Component Value Date   LDLCALC 39 07/12/2022   Stable, pt to continue current statin crestor 10 qd

## 2023-07-17 ENCOUNTER — Other Ambulatory Visit (INDEPENDENT_AMBULATORY_CARE_PROVIDER_SITE_OTHER): Payer: Medicare Other

## 2023-07-17 ENCOUNTER — Encounter: Payer: Self-pay | Admitting: Internal Medicine

## 2023-07-17 DIAGNOSIS — E7800 Pure hypercholesterolemia, unspecified: Secondary | ICD-10-CM

## 2023-07-17 DIAGNOSIS — R7302 Impaired glucose tolerance (oral): Secondary | ICD-10-CM

## 2023-07-17 LAB — URINALYSIS, ROUTINE W REFLEX MICROSCOPIC
Bilirubin Urine: NEGATIVE
Hgb urine dipstick: NEGATIVE
Ketones, ur: NEGATIVE
Leukocytes,Ua: NEGATIVE
Nitrite: NEGATIVE
Specific Gravity, Urine: 1.015 (ref 1.000–1.030)
Total Protein, Urine: NEGATIVE
Urine Glucose: NEGATIVE
Urobilinogen, UA: 2 — AB (ref 0.0–1.0)
pH: 6.5 (ref 5.0–8.0)

## 2023-07-17 LAB — HEPATIC FUNCTION PANEL
ALT: 12 U/L (ref 0–53)
AST: 12 U/L (ref 0–37)
Albumin: 4.1 g/dL (ref 3.5–5.2)
Alkaline Phosphatase: 71 U/L (ref 39–117)
Bilirubin, Direct: 0.2 mg/dL (ref 0.0–0.3)
Total Bilirubin: 0.7 mg/dL (ref 0.2–1.2)
Total Protein: 6.2 g/dL (ref 6.0–8.3)

## 2023-07-17 LAB — LIPID PANEL
Cholesterol: 117 mg/dL (ref 0–200)
HDL: 60.9 mg/dL (ref >39.00)
LDL Cholesterol: 41 mg/dL (ref 0–99)
NonHDL: 55.8
Total CHOL/HDL Ratio: 2
Triglycerides: 72 mg/dL (ref 0.0–149.0)
VLDL: 14.4 mg/dL (ref 0.0–40.0)

## 2023-07-17 LAB — BASIC METABOLIC PANEL
BUN: 28 mg/dL — ABNORMAL HIGH (ref 6–23)
CO2: 31 meq/L (ref 19–32)
Calcium: 9.2 mg/dL (ref 8.4–10.5)
Chloride: 103 meq/L (ref 96–112)
Creatinine, Ser: 0.79 mg/dL (ref 0.40–1.50)
GFR: 75.65 mL/min (ref >60.00)
Glucose, Bld: 111 mg/dL — ABNORMAL HIGH (ref 70–99)
Potassium: 4.2 meq/L (ref 3.5–5.1)
Sodium: 140 meq/L (ref 135–145)

## 2023-07-17 LAB — HEMOGLOBIN A1C: Hgb A1c MFr Bld: 5.5 % (ref 4.6–6.5)

## 2023-07-17 LAB — CBC WITH DIFFERENTIAL/PLATELET
Basophils Absolute: 0 10*3/uL (ref 0.0–0.1)
Basophils Relative: 0.5 % (ref 0.0–3.0)
Eosinophils Absolute: 0 10*3/uL (ref 0.0–0.7)
Eosinophils Relative: 1 % (ref 0.0–5.0)
HCT: 42.7 % (ref 39.0–52.0)
Hemoglobin: 14 g/dL (ref 13.0–17.0)
Lymphocytes Relative: 28.2 % (ref 12.0–46.0)
Lymphs Abs: 1.3 10*3/uL (ref 0.7–4.0)
MCHC: 32.9 g/dL (ref 30.0–36.0)
MCV: 92.4 fL (ref 78.0–100.0)
Monocytes Absolute: 0.6 10*3/uL (ref 0.1–1.0)
Monocytes Relative: 13.9 % — ABNORMAL HIGH (ref 3.0–12.0)
Neutro Abs: 2.6 10*3/uL (ref 1.4–7.7)
Neutrophils Relative %: 56.4 % (ref 43.0–77.0)
Platelets: 101 10*3/uL — ABNORMAL LOW (ref 150.0–400.0)
RBC: 4.62 Mil/uL (ref 4.22–5.81)
RDW: 14.8 % (ref 11.5–15.5)
WBC: 4.5 10*3/uL (ref 4.0–10.5)

## 2023-07-17 LAB — TSH: TSH: 3.4 u[IU]/mL (ref 0.35–5.50)

## 2023-07-17 NOTE — Progress Notes (Signed)
The test results show that your current treatment is OK, as the tests are stable.  Please continue the same plan.  There is no other need for change of treatment or further evaluation based on these results, at this time.  thanks

## 2023-08-10 ENCOUNTER — Ambulatory Visit (INDEPENDENT_AMBULATORY_CARE_PROVIDER_SITE_OTHER): Payer: Medicare Other

## 2023-08-10 VITALS — Ht 68.0 in | Wt 125.0 lb

## 2023-08-10 DIAGNOSIS — Z0000 Encounter for general adult medical examination without abnormal findings: Secondary | ICD-10-CM | POA: Diagnosis not present

## 2023-08-10 NOTE — Patient Instructions (Signed)
 Jason Barton , Thank you for taking time to come for your Medicare Wellness Visit. I appreciate your ongoing commitment to your health goals. Please review the following plan we discussed and let me know if I can assist you in the future.   Referrals/Orders/Follow-Ups/Clinician Recommendations: It was a pleasure talking with you today.  You are due for a tetanus vaccine.  Keep up the good work sir.  This is a list of the screening recommended for you and due dates:  Health Maintenance  Topic Date Due   DTaP/Tdap/Td vaccine (1 - Tdap) Never done   Zoster (Shingles) Vaccine (1 of 2) 04/17/1947   Medicare Annual Wellness Visit  08/09/2024   Pneumonia Vaccine  Completed   HPV Vaccine  Aged Out   Flu Shot  Discontinued   COVID-19 Vaccine  Discontinued    Advanced directives: (Copy Requested) Please bring a copy of your health care power of attorney and living will to the office to be added to your chart at your convenience.  Next Medicare Annual Wellness Visit scheduled for next year: Yes

## 2023-08-10 NOTE — Progress Notes (Signed)
 Subjective:   Jason Barton is a 89 y.o. male who presents for Medicare Annual/Subsequent preventive examination.  Visit Complete: Virtual I connected with  Jason Barton on 08/10/23 by a audio enabled telemedicine application and verified that I am speaking with the correct person using two identifiers.  Patient Location: Home [place]  Provider Location: Office/Clinic  I discussed the limitations of evaluation and management by telemedicine. The patient expressed understanding and agreed to proceed.  Vital Signs: Because this visit was a virtual/telehealth visit, some criteria may be missing or patient reported. Any vitals not documented were not able to be obtained and vitals that have been documented are patient reported.   Cardiac Risk Factors include: advanced age (>30men, >62 women);male gender;Other (see comment);dyslipidemia, Risk factor comments: CAP, COPD, Aortic atherosclerosis     Objective:    Today's Vitals   08/10/23 1344  Weight: 125 lb (56.7 kg)  Height: 5\' 8"  (1.727 m)   Body mass index is 19.01 kg/m.     08/10/2023    1:53 PM 04/19/2022   11:12 AM 06/23/2021    6:55 PM 06/23/2021    2:18 PM 12/03/2020    2:04 PM 02/07/2020    4:40 PM 11/01/2019   12:04 PM  Advanced Directives  Does Patient Have a Medical Advance Directive? Yes Yes Yes Yes Yes Yes Yes  Type of Estate agent of [place];Living will Healthcare Power of [place];Living will Living will;Healthcare Power of Attorney Living will;Healthcare Power of Attorney Living will;Healthcare Power of [employer] Power of [place];Living will Healthcare Power of [place];Living will  Does patient want to make changes to medical advance directive?   No - Patient declined  No - Guardian declined Yes (ED - Information included in AVS) No - Patient declined  Copy of Healthcare Power of Attorney in Chart? No - copy requested No - copy requested Yes - validated most recent copy  scanned in chart (See row information)  No - copy requested  No - copy requested  Would patient like information on creating a medical advance directive?   No - Patient declined        Current Medications (verified) Outpatient Encounter Medications as of 08/10/2023  Medication Sig   alfuzosin (UROXATRAL) 10 MG 24 hr tablet Take 1 tablet (10 mg total) by mouth daily with breakfast.   alfuzosin (UROXATRAL) 10 MG 24 hr tablet Take 10 mg by mouth as needed (prostate).   Ascorbic Acid (VITAMIN C WITH ROSE HIPS) 500 MG tablet Take 500 mg by mouth daily.   aspirin EC 81 MG tablet TAKE 1 TABLET BY MOUTH DAILY. SWALLOW WHOLE.   Cholecalciferol (VITAMIN D PO) Take 5,000 Units by mouth every morning.   doxycycline (VIBRA-TABS) 100 MG tablet Take 1 tablet (100 mg total) by mouth 2 (two) times daily.   guaiFENesin-dextromethorphan (ROBITUSSIN DM) 100-10 MG/5ML syrup Take 5 mLs by mouth every 4 (four) hours as needed for cough.   levothyroxine (SYNTHROID) 50 MCG tablet TAKE 1 TABLET BY MOUTH EVERY DAY BEFORE BREAKFAST   polyethylene glycol (MIRALAX / GLYCOLAX) 17 g packet Take 17 g by mouth daily.   rosuvastatin (CRESTOR) 10 MG tablet TAKE 1 TABLET BY MOUTH EVERY DAY   selenium 50 MCG TABS tablet Take 50 mcg by mouth daily.   thiamine 50 MG tablet Take 1 tablet (50 mg total) by mouth daily.   Zinc 50 MG TABS Take by mouth.   No facility-administered encounter medications on file as of 08/10/2023.  Allergies (verified) Augmentin [amoxicillin-pot clavulanate] and Peanut-containing drug products   History: Past Medical History:  Diagnosis Date   COPD (chronic obstructive pulmonary disease) (HCC) 05/21/2011   Diverticulosis of colon 05/21/2011   Hypothyroidism    Impaired glucose tolerance 06/08/2012   Increased prostate specific antigen (PSA) velocity 05/24/2011   Nonmelanoma skin cancer 05/21/2011   S/P appendectomy 05/21/2011   S/P inguinal hernia repair 05/21/2011   S/p small bowel obstruction  05/21/2011   Thoracic scoliosis 05/21/2011   Past Surgical History:  Procedure Laterality Date   APPENDECTOMY  1939   COLON RESECTION     2009 for a "kink in colon" adhesions after appendectomy   SHOULDER SURGERY     dr sypher; rotater cuff   STRABISMUS SURGERY Right 11/21/2014   Procedure: REPAIR STRABISMUS RIGHT EYE ;  Surgeon: Verne Carrow, MD;  Location: [facility];  Service: Ophthalmology;  Laterality: Right;   TONSILLECTOMY  1942   Family History  Problem Relation Age of Onset   Arthritis Other    Sudden death Other    Colon cancer Neg Hx    Esophageal cancer Neg Hx    Rectal cancer Neg Hx    Social History   Socioeconomic History   Marital status: Married    Spouse name: June   Number of children: 4   Years of education: MBA   Highest education level: Not on file  Occupational History   Occupation: retired  Tobacco Use   Smoking status: Never   Smokeless tobacco: Never  Vaping Use   Vaping status: Never Used  Substance and Sexual Activity   Alcohol use: Yes    Comment: Rare   Drug use: No   Sexual activity: Never  Other Topics Concern   Not on file  Social History Narrative   Lives at home with wife   Right Handed   Drinks 1-2 cups caffeine daily      Lives in independent living at [place] with wife    Social Drivers of Health   Financial Resource Strain: Low Risk  (08/10/2023)   Overall Financial Resource Strain (CARDIA)    Difficulty of Paying Living Expenses: Not hard at all  Food Insecurity: No Food Insecurity (08/10/2023)   Hunger Vital Sign    Worried About Running Out of Food in the Last Year: Never true    Ran Out of Food in the Last Year: Never true  Transportation Needs: No Transportation Needs (08/10/2023)   PRAPARE - Administrator, Civil Service (Medical): No    Lack of Transportation (Non-Medical): No  Physical Activity: Insufficiently Active (08/10/2023)   Exercise Vital Sign    Days of Exercise per Week: 3  days    Minutes of Exercise per Session: 40 min  Stress: No Stress Concern Present (08/10/2023)   [employer] of Occupational Health - Occupational Stress Questionnaire    Feeling of Stress : Not at all  Social Connections: Moderately Isolated (08/10/2023)   Social Connection and Isolation Panel [NHANES]    Frequency of Communication with Friends and Family: More than three times a week    Frequency of Social Gatherings with Friends and Family: Twice a week    Attends Religious Services: Never    Database administrator or Organizations: No    Attends Banker Meetings: Never    Marital Status: Married    Tobacco Counseling Counseling given: Not Answered   Clinical Intake:  Pre-visit preparation completed: Yes  Pain :  No/denies pain     BMI - recorded: 19.01 Nutritional Status: BMI <19  Underweight Nutritional Risks: None  How often do you need to have someone help you when you read instructions, pamphlets, or other written materials from your doctor or pharmacy?: 1 - Never  Interpreter Needed?: No  Information entered by :: Allean Montfort, RMA   Activities of Daily Living    08/10/2023    1:44 PM  In your present state of health, do you have any difficulty performing the following activities:  Hearing? 0  Vision? 0  Difficulty concentrating or making decisions? 0  Walking or climbing stairs? 0  Dressing or bathing? 0  Doing errands, shopping? 0  Preparing Food and eating ? N  Using the Toilet? N  In the past six months, have you accidently leaked urine? N  Do you have problems with loss of bowel control? N  Managing your Medications? N  Managing your Finances? N  Housekeeping or managing your Housekeeping? N    Patient Care Team: Corwin Levins, MD as PCP - General (Internal Medicine) Jens Som Madolyn Frieze, MD as PCP - Cardiology (Cardiology)  Indicate any recent Medical Services you may have received from other than [facility] providers in the past  year (date may be approximate).     Assessment:   This is a routine wellness examination for Jason Barton.  Hearing/Vision screen Hearing Screening - Comments:: Denies hearing difficulties   Vision Screening - Comments:: Denies vision issues.  Driving eyeglasses    Goals Addressed             This Visit's Progress    Patient Stated   On track    Maintain my current health status.      Depression Screen    08/10/2023    2:00 PM 07/11/2023   11:32 AM 01/11/2023    2:42 PM 07/15/2022   11:43 AM 04/19/2022   11:24 AM 09/30/2021   10:55 AM 12/03/2020    2:12 PM  PHQ 2/9 Scores  PHQ - 2 Score 3 0 0 0 1 0 0  PHQ- 9 Score 3          Fall Risk    08/10/2023    1:53 PM 07/11/2023   11:32 AM 01/11/2023    2:42 PM 07/15/2022   11:23 AM 04/19/2022   11:14 AM  Fall Risk   Falls in the past year? 1 1 1 1  0  Number falls in past yr: 0 0 1 0 0  Injury with Fall? 0 0 0 0 0  Risk for fall due to :  History of fall(s) History of fall(s) No Fall Risks No Fall Risks  Follow up Falls evaluation completed;Falls prevention discussed Falls evaluation completed Falls evaluation completed Falls evaluation completed Falls evaluation completed    MEDICARE RISK AT HOME: Medicare Risk at Home Any stairs in or around the home?: No Home free of loose throw rugs in walkways, pet beds, electrical cords, etc?: Yes Adequate lighting in your home to reduce risk of falls?: Yes Life alert?: No Use of a cane, walker or w/c?: No Grab bars in the bathroom?: Yes Shower chair or bench in shower?: No Elevated toilet seat or a handicapped toilet?: No  TIMED UP AND GO:  Was the test performed?  No    Cognitive Function:        08/10/2023    1:46 PM 04/19/2022   11:15 AM 11/01/2019   12:23 PM  6CIT Screen  What  Year? 0 points 0 points 0 points  What month? 0 points 0 points 0 points  What time? 0 points 0 points 0 points  Count back from 20 0 points 0 points 0 points  Months in reverse 0 points 0 points  0 points  Repeat phrase 0 points 0 points 0 points  Total Score 0 points 0 points 0 points    Immunizations Immunization History  Administered Date(s) Administered   Fluad Trivalent(High Dose 65+) 07/11/2023   Influenza, Seasonal, Injecte, Preservative Fre 07/17/2012   PFIZER(Purple Top)SARS-COV-2 Vaccination 07/26/2019, 08/20/2019, 04/15/2020   Pneumococcal Conjugate-13 06/11/2013   Pneumococcal Polysaccharide-23 08/17/2017   Zoster, Live 07/24/2014    TDAP status: Due, Education has been provided regarding the importance of this vaccine. Advised may receive this vaccine at local pharmacy or Health Dept. Aware to provide a copy of the vaccination record if obtained from local pharmacy or Health Dept. Verbalized acceptance and understanding.  Flu Vaccine status: Up to date  Pneumococcal vaccine status: Up to date  Covid-19 vaccine status: Declined, Education has been provided regarding the importance of this vaccine but patient still declined. Advised may receive this vaccine at local pharmacy or Health Dept.or vaccine clinic. Aware to provide a copy of the vaccination record if obtained from local pharmacy or Health Dept. Verbalized acceptance and understanding.  Qualifies for Shingles Vaccine? Yes   Zostavax completed Yes   Shingrix Completed?: No.    Education has been provided regarding the importance of this vaccine. Patient has been advised to call insurance company to determine out of pocket expense if they have not yet received this vaccine. Advised may also receive vaccine at local pharmacy or Health Dept. Verbalized acceptance and understanding.  Screening Tests Health Maintenance  Topic Date Due   DTaP/Tdap/Td (1 - Tdap) Never done   Zoster Vaccines- Shingrix (1 of 2) 04/17/1947   Medicare Annual Wellness (AWV)  08/09/2024   Pneumonia Vaccine 57+ Years old  Completed   HPV VACCINES  Aged Out   INFLUENZA VACCINE  Discontinued   COVID-19 Vaccine  Discontinued     Health Maintenance  Health Maintenance Due  Topic Date Due   DTaP/Tdap/Td (1 - Tdap) Never done   Zoster Vaccines- Shingrix (1 of 2) 04/17/1947    Colorectal cancer screening: No longer required.   Lung Cancer Screening: (Low Dose CT Chest recommended if Age 58-80 years, 20 pack-year currently smoking OR have quit w/in 15years.) does not qualify.   Lung Cancer Screening Referral: N/A  Additional Screening:  Hepatitis C Screening: does not qualify;  Vision Screening: Recommended annual ophthalmology exams for early detection of glaucoma and other disorders of the eye. Is the patient up to date with their annual eye exam?  Yes  Who is the provider or what is the name of the office in which the patient attends annual eye exams? [facility] If pt is not established with a provider, would they like to be referred to a provider to establish care? No .   Dental Screening: Recommended annual dental exams for proper oral hygiene   Community Resource Referral / Chronic Care Management: CRR required this visit?  No   CCM required this visit?  No     Plan:     I have personally reviewed and noted the following in the patient's chart:   Medical and social history Use of alcohol, tobacco or illicit drugs  Current medications and supplements including opioid prescriptions. Patient is not currently taking opioid prescriptions. Functional ability  and status Nutritional status Physical activity Advanced directives List of other physicians Hospitalizations, surgeries, and ER visits in previous 12 months Vitals Screenings to include cognitive, depression, and falls Referrals and appointments  In addition, I have reviewed and discussed with patient certain preventive protocols, quality metrics, and best practice recommendations. A written personalized care plan for preventive services as well as general preventive health recommendations were provided to patient.     Garland Smouse L  Breleigh Carpino, CMA   08/10/2023   After Visit Summary: (MyChart) Due to this being a telephonic visit, the after visit summary with patients personalized plan was offered to patient via MyChart   Nurse Notes: Patient is due for a Tdap vaccine.  He is up to date on all othe health maintenance with no concerns to address today.

## 2023-09-11 ENCOUNTER — Other Ambulatory Visit: Payer: Self-pay | Admitting: Internal Medicine

## 2023-09-11 ENCOUNTER — Encounter: Payer: Self-pay | Admitting: Internal Medicine

## 2023-09-11 MED ORDER — AZITHROMYCIN 250 MG PO TABS: ORAL_TABLET | ORAL | 1 refills | Status: AC

## 2023-09-27 NOTE — Progress Notes (Unsigned)
 Cardiology Office Note    Date:  09/27/2023  ID:  Naol, Ontiveros 01-19-1926, MRN [id number] PCP:  Corwin Levins, MD  Cardiologist:  Olga Millers, MD  Electrophysiologist:  None   Chief Complaint: ***  History of Present Illness: .    JOHNPATRICK JENNY is a 89 y.o. male with visit-pertinent history of coronary calcifications noted on chest CT in 12/2022, aortic atherosclerosis, bradycardia, hyperlipidemia, possible TIA in 09/2020, COPD, hypothyroidism, chronic thrombocytopenia, bladder outlet obstruction and dementia.  Patient has been followed by Dr. Ludwig Clarks in 12/2020 for bradycardia.  Monitor in 10/2020 after a possible TIA showed underlying sinus bradycardia with an average heart rate of 55 bpm and 9% ventricular ectopy but no concerning arrhythmias.  Echo at that time showed LVEF of 60 to 65% with normal wall motion and grade 1 diastolic dysfunction, normal RV and mild dilation of the aortic root and ascending aorta measuring 41 mm and 42 mm respectively.  Patient was admitted from 01/04/2023 to 01/08/2023 for acute hypoxic respiratory failure secondary to COVID-19 infection and suspected aspiration pneumonia after presenting with lethargy, fever and 2 clearly mechanical falls.  Cardiology was consulted bradycardia.  He denied any specific cardiac symptoms.  Telemetry showed sinus rhythm with rates mostly in the 50s, occasionally in the 40s.  Patient's heart rate would increase to 70s to 80s with minimal activity such as moving around in the bed.  Pulmonary also showed frequent PACs/PVCs with some short compensatory pauses however there was no significant bradycardia or high-grade AV block.  His bradycardia on telemetry was felt to be consistent with his baseline and there was no need for pacemaker.  Patient was last seen in clinic on 02/14/2023, he remained stable from a cardiac standpoint.  Bradycardia/Freqent PVCS: Patient with a history of asymptomatic bradycardia.  Monitor in 10/2020  showed sinus bradycardia with an average rate of 55 bpm and 9% ventricular ectopy but no concerning arrhythmias.  Echo showed normal LV function at that time.   EKG today indicates Asymptomatic? Continue to avoid AV nodal agents  Coronary artery calcifications/aortic atherosclerosis: Noted on CT in 12/2022. Patient denies any chest pain. Continue aspirin and statin.  Hyperlipidemia: Lipid panel on 07/17/2023 indicates total cholesterol 117, HDL 60, triglycerides 72 and LDL 41.  Possible TIA: Patient has history of possible TIA in 10/07/2020 and 07/09/2021.  CTA in 06/2021 showed no large vessel occlusion in head or neck but a short segment of moderate stenosis at the supraclinoid left ICA.  Continue aspirin and statin.  Labwork independently reviewed: 07/17/2023: Sodium 140, potassium 4.2, creatinine 0.79, AST 12, ALT 12, hemoglobin 14, hematocrit 42.7  ROS: .   *** denies chest pain, shortness of breath, lower extremity edema, fatigue, palpitations, melena, hematuria, hemoptysis, diaphoresis, weakness, presyncope, syncope, orthopnea, and PND.  All other systems are reviewed and otherwise negative.  Studies Reviewed: Marland Kitchen    EKG:  EKG is ordered today, personally reviewed, demonstrating ***     CV Studies: Cardiac studies reviewed are outlined and summarized above. Otherwise please see EMR for full report. Cardiac Studies & Procedures   ______________________________________________________________________________________________     ECHOCARDIOGRAM  ECHOCARDIOGRAM COMPLETE 10/20/2020  Narrative ECHOCARDIOGRAM REPORT    Patient Name:   AXTEN PASCUCCI Date of Exam: 10/20/2020 Medical Rec #:  [id number]         Height:       68.0 in Accession #:    [id number]        Weight:  128.0 lb Date of Birth:  June 20, 1926        BSA:          1.691 m Patient Age:    89 years          BP:           148/80 mmHg Patient Gender: M                 HR:           66 bpm. Exam Location:  [address]  Procedure: 2D Echo, Cardiac Doppler and Color Doppler  Indications:    G45.9 TIA  History:        Patient has no prior history of Echocardiogram examinations. COPD; Risk Factors:Dyslipidemia. Bradycardia. Impaired glucose tolerance. Left back pain. Anemia. Left-sided chest wall apin.  Sonographer:    Chanetta Marshall BA, RDCS Referring Phys: [id number] [facility]  IMPRESSIONS   1. Left ventricular ejection fraction, by estimation, is 60 to 65%. The left ventricle has normal function. The left ventricle has no regional wall motion abnormalities. Left ventricular diastolic parameters are consistent with Grade I diastolic dysfunction (impaired relaxation). 2. Right ventricular systolic function is normal. The right ventricular size is normal. There is normal pulmonary artery systolic pressure. The estimated right ventricular systolic pressure is 18.7 mmHg. 3. The mitral valve is grossly normal. Trivial mitral valve regurgitation. No evidence of mitral stenosis. 4. The aortic valve is tricuspid. There is mild calcification of the aortic valve. Aortic valve regurgitation is mild. Mild aortic valve sclerosis is present, with no evidence of aortic valve stenosis. 5. Aortic dilatation noted. There is mild dilatation of the aortic root, measuring 41 mm. There is mild dilatation of the aortic root and of the ascending aorta, measuring 42 mm. 6. The inferior vena cava is normal in size with greater than 50% respiratory variability, suggesting right atrial pressure of 3 mmHg.  FINDINGS Left Ventricle: Left ventricular ejection fraction, by estimation, is 60 to 65%. The left ventricle has normal function. The left ventricle has no regional wall motion abnormalities. The left ventricular internal cavity size was normal in size. There is no left ventricular hypertrophy. Left ventricular diastolic parameters are consistent with Grade I diastolic dysfunction (impaired relaxation).  Right Ventricle: The  right ventricular size is normal. No increase in right ventricular wall thickness. Right ventricular systolic function is normal. There is normal pulmonary artery systolic pressure. The tricuspid regurgitant velocity is 1.98 m/s, and with an assumed right atrial pressure of 3 mmHg, the estimated right ventricular systolic pressure is 18.7 mmHg.  Left Atrium: Left atrial size was normal in size.  Right Atrium: Right atrial size was normal in size.  Pericardium: Trivial pericardial effusion is present. Presence of pericardial fat pad.  Mitral Valve: The mitral valve is grossly normal. Trivial mitral valve regurgitation. No evidence of mitral valve stenosis.  Tricuspid Valve: The tricuspid valve is grossly normal. Tricuspid valve regurgitation is mild . No evidence of tricuspid stenosis.  Aortic Valve: The aortic valve is tricuspid. There is mild calcification of the aortic valve. Aortic valve regurgitation is mild. Mild aortic valve sclerosis is present, with no evidence of aortic valve stenosis.  Pulmonic Valve: The pulmonic valve was grossly normal. Pulmonic valve regurgitation is not visualized. No evidence of pulmonic stenosis.  Aorta: Aortic dilatation noted. There is mild dilatation of the aortic root, measuring 41 mm. There is mild dilatation of the aortic root and of the ascending aorta, measuring 42 mm.  Venous: The inferior vena cava is normal in size with greater than 50% respiratory variability, suggesting right atrial pressure of 3 mmHg.  IAS/Shunts: The atrial septum is grossly normal.   LEFT VENTRICLE PLAX 2D LVIDd:         3.80 cm  Diastology LVIDs:         3.00 cm  LV e' medial:    2.28 cm/s LV PW:         1.10 cm  LV E/e' medial:  16.0 LV IVS:        0.90 cm  LV e' lateral:   3.75 cm/s LVOT diam:     2.40 cm  LV E/e' lateral: 9.7 LV SV:         74 LV SV Index:   44 LVOT Area:     4.52 cm   RIGHT VENTRICLE RV Basal diam:  4.00 cm RV Mid diam:    2.90 cm RV S  prime:     11.60 cm/s TAPSE (M-mode): 2.2 cm RVSP:           18.7 mmHg  LEFT ATRIUM             Index       RIGHT ATRIUM           Index LA diam:        3.60 cm 2.13 cm/m  RA Pressure: 3.00 mmHg LA Vol (A2C):   39.3 ml 23.25 ml/m RA Area:     13.20 cm LA Vol (A4C):   25.1 ml 14.85 ml/m RA Volume:   29.80 ml  17.63 ml/m LA Biplane Vol: 34.0 ml 20.11 ml/m AORTIC VALVE LVOT Vmax:   72.47 cm/s LVOT Vmean:  53.900 cm/s LVOT VTI:    0.164 m  AORTA Ao Root diam: 4.10 cm Ao Asc diam:  4.20 cm  MITRAL VALVE               TRICUSPID VALVE MV Area (PHT): 1.30 cm    TR Peak grad:   15.7 mmHg MV Decel Time: 586 msec    TR Vmax:        198.00 cm/s MV E velocity: 36.43 cm/s  Estimated RAP:  3.00 mmHg MV A velocity: 76.97 cm/s  RVSP:           18.7 mmHg MV E/A ratio:  0.47 SHUNTS Systemic VTI:  0.16 m Systemic Diam: 2.40 cm  Lennie Odor MD Electronically signed by Lennie Odor MD Signature Date/Time: 10/20/2020/12:21:13 PM    Final    MONITORS  CARDIAC EVENT MONITOR 10/20/2020  Narrative Predominant rhythm was sinus rhythm 9% ventricular ectopy No symptoms recorded No prolonged arrhythmias noted  Will Camnitz, MD       ______________________________________________________________________________________________       Current Reported Medications:.    No outpatient medications have been marked as taking for the 09/28/23 encounter (Appointment) with Rip Harbour, NP.    Physical Exam:    VS:  There were no vitals taken for this visit.   Wt Readings from Last 3 Encounters:  08/10/23 125 lb (56.7 kg)  07/11/23 125 lb (56.7 kg)  02/14/23 122 lb (55.3 kg)    GEN: Well nourished, well developed in no acute distress NECK: No JVD; No carotid bruits CARDIAC: ***RRR, no murmurs, rubs, gallops RESPIRATORY:  Clear to auscultation without rales, wheezing or rhonchi  ABDOMEN: Soft, non-tender, non-distended EXTREMITIES:  No edema; No acute deformity     Asessement  and Plan:.     ***  Disposition: F/u with ***  Signed, Rip Harbour, NP

## 2023-09-28 ENCOUNTER — Ambulatory Visit: Attending: Cardiology | Admitting: Cardiology

## 2023-09-28 ENCOUNTER — Encounter: Payer: Self-pay | Admitting: Cardiology

## 2023-09-28 ENCOUNTER — Other Ambulatory Visit: Payer: Self-pay

## 2023-09-28 ENCOUNTER — Other Ambulatory Visit: Payer: Self-pay | Admitting: Internal Medicine

## 2023-09-28 VITALS — BP 112/74 | HR 59 | Ht 67.0 in | Wt 122.4 lb

## 2023-09-28 DIAGNOSIS — R001 Bradycardia, unspecified: Secondary | ICD-10-CM | POA: Diagnosis not present

## 2023-09-28 DIAGNOSIS — G459 Transient cerebral ischemic attack, unspecified: Secondary | ICD-10-CM

## 2023-09-28 DIAGNOSIS — I700 Atherosclerosis of aorta: Secondary | ICD-10-CM | POA: Diagnosis not present

## 2023-09-28 DIAGNOSIS — E785 Hyperlipidemia, unspecified: Secondary | ICD-10-CM

## 2023-09-28 DIAGNOSIS — I2510 Atherosclerotic heart disease of native coronary artery without angina pectoris: Secondary | ICD-10-CM | POA: Diagnosis not present

## 2023-09-28 NOTE — Patient Instructions (Signed)
 Medication Instructions:  No medication changes were made during today's visit.  *If you need a refill on your cardiac medications before your next appointment, please call your pharmacy*   Lab Work: No labs were ordered during today's visit.  If you have labs (blood work) drawn today and your tests are completely normal, you will receive your results only by: MyChart Message (if you have MyChart) OR A paper copy in the mail If you have any lab test that is abnormal or we need to change your treatment, we will call you to review the results.   Testing/Procedures: No procedures were ordered during today's visit.    Follow-Up: At [facility], you and your health needs are our priority.  As part of our continuing mission to provide you with exceptional heart care, we have created designated Provider Care Teams.  These Care Teams include your primary Cardiologist (physician) and Advanced Practice Providers (APPs -  Physician Assistants and Nurse Practitioners) who all work together to provide you with the care you need, when you need it.  We recommend signing up for the patient portal called "MyChart".  Sign up information is provided on this After Visit Summary.  MyChart is used to connect with patients for Virtual Visits (Telemedicine).  Patients are able to view lab/test results, encounter notes, upcoming appointments, etc.  Non-urgent messages can be sent to your provider as well.   To learn more about what you can do with MyChart, go to [email or web address].    Your next appointment:      Provider:       Other Instructions [facility]  [address] [place], [place] [address] [address] [phone]       1st Floor: - Lobby - Registration  - Pharmacy  - Lab - Cafe   2nd Floor: - PV Lab - Diagnostic Testing (echo, CT, nuclear med)   3rd Floor: - Vacant   4th Floor: - TCTS (cardiothoracic surgery) - AFib Clinic - Structural Heart  Clinic - Vascular Surgery  - Vascular Ultrasound   5th Floor: - [facility] (general and EP) - Clinical Pharmacy for coumadin, hypertension, lipid, weight-loss medications, and med management appointments      Valet parking services will be available as well.

## 2023-12-07 ENCOUNTER — Encounter: Payer: Self-pay | Admitting: Internal Medicine

## 2023-12-07 DIAGNOSIS — N401 Enlarged prostate with lower urinary tract symptoms: Secondary | ICD-10-CM

## 2024-01-09 ENCOUNTER — Ambulatory Visit: Payer: Self-pay | Admitting: Internal Medicine

## 2024-01-09 ENCOUNTER — Ambulatory Visit: Payer: Medicare Other | Admitting: Internal Medicine

## 2024-01-09 ENCOUNTER — Encounter: Payer: Self-pay | Admitting: Internal Medicine

## 2024-01-09 VITALS — BP 100/80 | HR 62 | Temp 98.0°F | Ht 67.0 in | Wt 121.4 lb

## 2024-01-09 DIAGNOSIS — E611 Iron deficiency: Secondary | ICD-10-CM

## 2024-01-09 DIAGNOSIS — R5383 Other fatigue: Secondary | ICD-10-CM

## 2024-01-09 DIAGNOSIS — D696 Thrombocytopenia, unspecified: Secondary | ICD-10-CM

## 2024-01-09 DIAGNOSIS — R7302 Impaired glucose tolerance (oral): Secondary | ICD-10-CM

## 2024-01-09 DIAGNOSIS — E7800 Pure hypercholesterolemia, unspecified: Secondary | ICD-10-CM | POA: Diagnosis not present

## 2024-01-09 LAB — LIPID PANEL
Cholesterol: 127 mg/dL (ref 0–200)
HDL: 66.5 mg/dL (ref >39.00)
LDL Cholesterol: 44 mg/dL (ref 0–99)
NonHDL: 60.25
Total CHOL/HDL Ratio: 2
Triglycerides: 83 mg/dL (ref 0.0–149.0)
VLDL: 16.6 mg/dL (ref 0.0–40.0)

## 2024-01-09 LAB — CBC WITH DIFFERENTIAL/PLATELET
Basophils Absolute: 0 K/uL (ref 0.0–0.1)
Basophils Relative: 0.3 % (ref 0.0–3.0)
Eosinophils Absolute: 0 K/uL (ref 0.0–0.7)
Eosinophils Relative: 0.4 % (ref 0.0–5.0)
HCT: 41.4 % (ref 39.0–52.0)
Hemoglobin: 13.8 g/dL (ref 13.0–17.0)
Lymphocytes Relative: 28.5 % (ref 12.0–46.0)
Lymphs Abs: 1.1 K/uL (ref 0.7–4.0)
MCHC: 33.3 g/dL (ref 30.0–36.0)
MCV: 91.9 fl (ref 78.0–100.0)
Monocytes Absolute: 0.7 K/uL (ref 0.1–1.0)
Monocytes Relative: 18.5 % — ABNORMAL HIGH (ref 3.0–12.0)
Neutro Abs: 2 K/uL (ref 1.4–7.7)
Neutrophils Relative %: 52.3 % (ref 43.0–77.0)
Platelets: 91 K/uL — ABNORMAL LOW (ref 150.0–400.0)
RBC: 4.51 Mil/uL (ref 4.22–5.81)
RDW: 14.1 % (ref 11.5–15.5)
WBC: 3.9 K/uL — ABNORMAL LOW (ref 4.0–10.5)

## 2024-01-09 LAB — TSH: TSH: 3.7 u[IU]/mL (ref 0.35–5.50)

## 2024-01-09 LAB — BASIC METABOLIC PANEL WITH GFR
BUN: 26 mg/dL — ABNORMAL HIGH (ref 6–23)
CO2: 32 meq/L (ref 19–32)
Calcium: 9.1 mg/dL (ref 8.4–10.5)
Chloride: 103 meq/L (ref 96–112)
Creatinine, Ser: 0.82 mg/dL (ref 0.40–1.50)
GFR: 74.55 mL/min (ref >60.00)
Glucose, Bld: 122 mg/dL — ABNORMAL HIGH (ref 70–99)
Potassium: 3.9 meq/L (ref 3.5–5.1)
Sodium: 139 meq/L (ref 135–145)

## 2024-01-09 LAB — TESTOSTERONE: Testosterone: 167.31 ng/dL — ABNORMAL LOW (ref 300.00–890.00)

## 2024-01-09 LAB — IBC PANEL
Iron: 104 ug/dL (ref 42–165)
Saturation Ratios: 29.6 % (ref 20.0–50.0)
TIBC: 351.4 ug/dL (ref 250.0–450.0)
Transferrin: 251 mg/dL (ref 212.0–360.0)

## 2024-01-09 LAB — HEPATIC FUNCTION PANEL
ALT: 11 U/L (ref 0–53)
AST: 17 U/L (ref 0–37)
Albumin: 4.2 g/dL (ref 3.5–5.2)
Alkaline Phosphatase: 53 U/L (ref 39–117)
Bilirubin, Direct: 0.2 mg/dL (ref 0.0–0.3)
Total Bilirubin: 0.7 mg/dL (ref 0.2–1.2)
Total Protein: 6.5 g/dL (ref 6.0–8.3)

## 2024-01-09 LAB — FERRITIN: Ferritin: 23.3 ng/mL (ref 22.0–322.0)

## 2024-01-09 LAB — HEMOGLOBIN A1C: Hgb A1c MFr Bld: 5.6 % (ref 4.6–6.5)

## 2024-01-09 NOTE — Assessment & Plan Note (Signed)
 No recent overt bleeding, now for check iron with labs

## 2024-01-09 NOTE — Assessment & Plan Note (Signed)
 Lab Results  Component Value Date   HGBA1C 5.6 01/09/2024   Stable, pt to continue current medical treatment  - diet, wt control

## 2024-01-09 NOTE — Assessment & Plan Note (Signed)
 Lab Results  Component Value Date   LDLCALC 44 01/09/2024   Stable, pt to continue current statin crestor  10 mg qd

## 2024-01-09 NOTE — Assessment & Plan Note (Signed)
 No recent bleeding, for cbc with labs

## 2024-01-09 NOTE — Patient Instructions (Signed)

## 2024-01-09 NOTE — Progress Notes (Signed)
 Patient ID: Jason Barton, male   DOB: Mar 07, 1926, 89 y.o.   MRN: [id number]        Chief Complaint: follow up HTN, HLD and hyperglycemia, fatigue, iron deficiency       HPI:  Jason Barton is a 89 y.o. male here with mild reduce energy level x 3 mo, now often naps in the am and pm most days which is increased for him. Pt denies chest pain, increased sob or doe, wheezing, orthopnea, PND, increased LE swelling, palpitations, dizziness or syncope.   Pt denies polydipsia, polyuria, or new focal neuro s/s.    Walks up to 1/2 - 1 mile x 3-4 x per wk (indoors at [employer]).  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   Wt Readings from Last 3 Encounters:  01/09/24 121 lb 6.4 oz (55.1 kg)  09/28/23 122 lb 6.4 oz (55.5 kg)  08/10/23 125 lb (56.7 kg)   BP Readings from Last 3 Encounters:  01/09/24 100/80  09/28/23 112/74  07/11/23 122/74         Past Medical History:  Diagnosis Date   COPD (chronic obstructive pulmonary disease) (HCC) 05/21/2011   Diverticulosis of colon 05/21/2011   Hypothyroidism    Impaired glucose tolerance 06/08/2012   Increased prostate specific antigen (PSA) velocity 05/24/2011   Nonmelanoma skin cancer 05/21/2011   S/P appendectomy 05/21/2011   S/P inguinal hernia repair 05/21/2011   S/p small bowel obstruction 05/21/2011   Thoracic scoliosis 05/21/2011   Past Surgical History:  Procedure Laterality Date   APPENDECTOMY  1939   COLON RESECTION     2009 for a kink in colon adhesions after appendectomy   SHOULDER SURGERY     dr sypher; rotater cuff   STRABISMUS SURGERY Right 11/21/2014   Procedure: REPAIR STRABISMUS RIGHT EYE ;  Surgeon: Elsie Salt, MD;  Location: [facility];  Service: Ophthalmology;  Laterality: Right;   TONSILLECTOMY  1942    reports that he has never smoked. He has never used smokeless tobacco. He reports current alcohol use. He reports that he does not use drugs. family history includes Arthritis in an  other family member; Sudden death in an other family member. Allergies  Allergen Reactions   Augmentin  [Amoxicillin -Pot Clavulanate] Rash    Itching rash to all torso 2 days after start augmentin  12/2017   Peanut-Containing Drug Products Rash   Current Outpatient Medications on File Prior to Visit  Medication Sig Dispense Refill   alfuzosin  (UROXATRAL ) 10 MG 24 hr tablet Take 1 tablet (10 mg total) by mouth daily with breakfast. 90 tablet 1   alfuzosin  (UROXATRAL ) 10 MG 24 hr tablet Take 10 mg by mouth as needed (prostate).     Ascorbic Acid (VITAMIN C WITH ROSE HIPS) 500 MG tablet Take 500 mg by mouth daily.     aspirin  EC 81 MG tablet TAKE 1 TABLET BY MOUTH DAILY. SWALLOW WHOLE. 120 tablet PRN   Cholecalciferol (VITAMIN D PO) Take 5,000 Units by mouth every morning.     guaiFENesin -dextromethorphan (ROBITUSSIN DM) 100-10 MG/5ML syrup Take 5 mLs by mouth every 4 (four) hours as needed for cough. 118 mL 0   levothyroxine  (SYNTHROID ) 50 MCG tablet TAKE 1 TABLET BY MOUTH EVERY DAY BEFORE BREAKFAST 90 tablet 3   polyethylene glycol (MIRALAX  / GLYCOLAX ) 17 g packet Take 17 g by mouth daily.     rosuvastatin  (CRESTOR ) 10 MG tablet TAKE 1 TABLET BY MOUTH EVERY DAY 90 tablet  3   selenium 50 MCG TABS tablet Take 50 mcg by mouth daily.     thiamine  50 MG tablet Take 1 tablet (50 mg total) by mouth daily. 90 tablet 1   Zinc 50 MG TABS Take by mouth.     No current facility-administered medications on file prior to visit.        ROS:  All others reviewed and negative.  Objective        PE:  BP 100/80   Pulse 62   Temp 98 F (36.7 C)   Ht 5' 7 (1.702 m)   Wt 121 lb 6.4 oz (55.1 kg)   SpO2 99%   BMI 19.01 kg/m                 Constitutional: Pt appears in NAD               HENT: Head: NCAT.                Right Ear: External ear normal.                 Left Ear: External ear normal.                Eyes: . Pupils are equal, round, and reactive to light. Conjunctivae and EOM are normal                Nose: without d/c or deformity               Neck: Neck supple. Gross normal ROM               Cardiovascular: Normal rate and regular rhythm.                 Pulmonary/Chest: Effort normal and breath sounds without rales or wheezing.                Abd:  Soft, NT, ND, + BS, no organomegaly               Neurological: Pt is alert. At baseline orientation, motor grossly intact               Skin: Skin is warm. No rashes, no other new lesions, LE edema - none               Psychiatric: Pt behavior is normal without agitation   Micro: none  Cardiac tracings I have personally interpreted today:  none  Pertinent Radiological findings (summarize): none   Lab Results  Component Value Date   WBC 3.9 (L) 01/09/2024   HGB 13.8 01/09/2024   HCT 41.4 01/09/2024   PLT 91.0 (L) 01/09/2024   GLUCOSE 122 (H) 01/09/2024   CHOL 127 01/09/2024   TRIG 83.0 01/09/2024   HDL 66.50 01/09/2024   LDLCALC 44 01/09/2024   ALT 11 01/09/2024   AST 17 01/09/2024   NA 139 01/09/2024   K 3.9 01/09/2024   CL 103 01/09/2024   CREATININE 0.82 01/09/2024   BUN 26 (H) 01/09/2024   CO2 32 01/09/2024   TSH 3.70 01/09/2024   INR 1.1 01/04/2023   HGBA1C 5.6 01/09/2024   Assessment/Plan:  Jason Barton is a 89 y.o. White or Caucasian [1] male with  has a past medical history of COPD (chronic obstructive pulmonary disease) (HCC) (05/21/2011), Diverticulosis of colon (05/21/2011), Hypothyroidism, Impaired glucose tolerance (06/08/2012), Increased prostate specific antigen (PSA) velocity (05/24/2011), Nonmelanoma skin cancer (05/21/2011), S/P appendectomy (05/21/2011), S/P inguinal hernia  repair (05/21/2011), S/p small bowel obstruction (05/21/2011), and Thoracic scoliosis (05/21/2011).  Fatigue Likely geriatric declines related, pt requests testosterone  level  Hyperlipidemia Lab Results  Component Value Date   LDLCALC 44 01/09/2024   Stable, pt to continue current statin crestor  10 mg qd   Impaired  glucose tolerance Lab Results  Component Value Date   HGBA1C 5.6 01/09/2024   Stable, pt to continue current medical treatment  - diet, wt control   Iron deficiency No recent overt bleeding, now for check iron with labs  Thrombocytopenia (HCC) No recent bleeding, for cbc with labs  Followup: Return in about 6 months (around 07/11/2024).  Lynwood Rush, MD 01/09/2024 8:30 PM [facility]

## 2024-01-09 NOTE — Assessment & Plan Note (Signed)
 Likely geriatric declines related, pt requests testosterone  level

## 2024-01-22 NOTE — Progress Notes (Unsigned)
 HPI: FU bradycardia.  Seen with possible TIA April 2022.  Also noted to have bradycardia with heart rate of 49.  Carotid Dopplers April 2022 showed near normal bilaterally.  Echocardiogram May 2022 showed normal LV function, grade 1 diastolic dysfunction, mild aortic insufficiency, mildly dilated aortic root at 41 mm.  Monitor June 2022 showed sinus rhythm with PACs, brief PAT, PVCs; lowest heart rate 38 at 5:54 AM.  No symptoms reported.  CTA January 2023 negative for large vessel occlusion.  There was a short segment of moderate stenosis at the supraclinoid left internal carotid artery.  Since last seen, patient denies dyspnea, chest pain, palpitations or syncope.  Current Outpatient Medications  Medication Sig Dispense Refill   alfuzosin  (UROXATRAL ) 10 MG 24 hr tablet Take 1 tablet (10 mg total) by mouth daily with breakfast. 90 tablet 1   alfuzosin  (UROXATRAL ) 10 MG 24 hr tablet Take 10 mg by mouth as needed (prostate).     Ascorbic Acid (VITAMIN C WITH ROSE HIPS) 500 MG tablet Take 500 mg by mouth daily.     aspirin  EC 81 MG tablet TAKE 1 TABLET BY MOUTH DAILY. SWALLOW WHOLE. 120 tablet PRN   Cholecalciferol (VITAMIN D PO) Take 5,000 Units by mouth every morning.     guaiFENesin -dextromethorphan (ROBITUSSIN DM) 100-10 MG/5ML syrup Take 5 mLs by mouth every 4 (four) hours as needed for cough. 118 mL 0   levothyroxine  (SYNTHROID ) 50 MCG tablet TAKE 1 TABLET BY MOUTH EVERY DAY BEFORE BREAKFAST 90 tablet 3   polyethylene glycol (MIRALAX  / GLYCOLAX ) 17 g packet Take 17 g by mouth daily.     rosuvastatin  (CRESTOR ) 10 MG tablet TAKE 1 TABLET BY MOUTH EVERY DAY 90 tablet 3   selenium 50 MCG TABS tablet Take 50 mcg by mouth daily.     thiamine  50 MG tablet Take 1 tablet (50 mg total) by mouth daily. 90 tablet 1   Zinc 50 MG TABS Take by mouth.     No current facility-administered medications for this visit.     Past Medical History:  Diagnosis Date   COPD (chronic obstructive pulmonary  disease) (HCC) 05/21/2011   Diverticulosis of colon 05/21/2011   Hypothyroidism    Impaired glucose tolerance 06/08/2012   Increased prostate specific antigen (PSA) velocity 05/24/2011   Nonmelanoma skin cancer 05/21/2011   S/P appendectomy 05/21/2011   S/P inguinal hernia repair 05/21/2011   S/p small bowel obstruction 05/21/2011   Thoracic scoliosis 05/21/2011    Past Surgical History:  Procedure Laterality Date   APPENDECTOMY  1939   COLON RESECTION     2009 for a kink in colon adhesions after appendectomy   SHOULDER SURGERY     dr sypher; rotater cuff   STRABISMUS SURGERY Right 11/21/2014   Procedure: REPAIR STRABISMUS RIGHT EYE ;  Surgeon: Elsie Salt, MD;  Location: [facility];  Service: Ophthalmology;  Laterality: Right;   TONSILLECTOMY  1942    Social History   Socioeconomic History   Marital status: Married    Spouse name: June   Number of children: 4   Years of education: MBA   Highest education level: Not on file  Occupational History   Occupation: retired  Tobacco Use   Smoking status: Never   Smokeless tobacco: Never  Vaping Use   Vaping status: Never Used  Substance and Sexual Activity   Alcohol use: Yes    Comment: Rare   Drug use: No   Sexual activity: Never  Other Topics Concern   Not on file  Social History Narrative   Lives at home with wife   Right Handed   Drinks 1-2 cups caffeine daily      Lives in independent living at [facility] with wife    Social Drivers of Health   Financial Resource Strain: Low Risk  (08/10/2023)   Overall Financial Resource Strain (CARDIA)    Difficulty of Paying Living Expenses: Not hard at all  Food Insecurity: No Food Insecurity (08/10/2023)   Hunger Vital Sign    Worried About Running Out of Food in the Last Year: Never true    Ran Out of Food in the Last Year: Never true  Transportation Needs: No Transportation Needs (08/10/2023)   PRAPARE - Administrator, Civil Service (Medical): No     Lack of Transportation (Non-Medical): No  Physical Activity: Insufficiently Active (08/10/2023)   Exercise Vital Sign    Days of Exercise per Week: 3 days    Minutes of Exercise per Session: 40 min  Stress: No Stress Concern Present (08/10/2023)   [employer] of Occupational Health - Occupational Stress Questionnaire    Feeling of Stress : Not at all  Social Connections: Moderately Isolated (08/10/2023)   Social Connection and Isolation Panel    Frequency of Communication with Friends and Family: More than three times a week    Frequency of Social Gatherings with Friends and Family: Twice a week    Attends Religious Services: Never    Database administrator or Organizations: No    Attends Banker Meetings: Never    Marital Status: Married  Catering manager Violence: Not At Risk (04/19/2022)   Humiliation, Afraid, Rape, and Kick questionnaire    Fear of Current or Ex-Partner: No    Emotionally Abused: No    Physically Abused: No    Sexually Abused: No    Family History  Problem Relation Age of Onset   Arthritis Other    Sudden death Other    Colon cancer Neg Hx    Esophageal cancer Neg Hx    Rectal cancer Neg Hx     ROS: no fevers or chills, productive cough, hemoptysis, dysphasia, odynophagia, melena, hematochezia, dysuria, hematuria, rash, seizure activity, orthopnea, PND, pedal edema, claudication. Remaining systems are negative.  Physical Exam: Well-developed well-nourished in no acute distress.  Skin is warm and dry.  HEENT is normal.  Neck is supple.  Chest is clear to auscultation with normal expansion.  Cardiovascular exam is regular rate and rhythm.  Abdominal exam nontender or distended. No masses palpated. Extremities show no edema. neuro grossly intact   A/P  1 bradycardia-patient denies history of syncope.  Continue to follow.  2 aortic atherosclerosis/coronary calcification-continue statin.  Patient denies chest pain.  3  hyperlipidemia-continue statin.  4 history of TIA-continue aspirin .  Redell Shallow, MD

## 2024-01-24 ENCOUNTER — Ambulatory Visit: Attending: Cardiology | Admitting: Cardiology

## 2024-01-24 ENCOUNTER — Encounter: Payer: Self-pay | Admitting: Cardiology

## 2024-01-24 VITALS — BP 120/62 | HR 59 | Ht 67.0 in | Wt 119.4 lb

## 2024-01-24 DIAGNOSIS — E785 Hyperlipidemia, unspecified: Secondary | ICD-10-CM | POA: Diagnosis not present

## 2024-01-24 DIAGNOSIS — R001 Bradycardia, unspecified: Secondary | ICD-10-CM | POA: Diagnosis not present

## 2024-01-24 DIAGNOSIS — I2510 Atherosclerotic heart disease of native coronary artery without angina pectoris: Secondary | ICD-10-CM | POA: Diagnosis not present

## 2024-01-24 NOTE — Patient Instructions (Signed)

## 2024-02-20 ENCOUNTER — Ambulatory Visit: Admitting: Cardiology

## 2024-03-05 ENCOUNTER — Other Ambulatory Visit: Payer: Self-pay | Admitting: Internal Medicine

## 2024-03-11 ENCOUNTER — Emergency Department (HOSPITAL_BASED_OUTPATIENT_CLINIC_OR_DEPARTMENT_OTHER)
Admission: EM | Admit: 2024-03-11 | Discharge: 2024-03-11 | Disposition: A | Discharge status: Home / Self Care | Attending: Emergency Medicine | Admitting: Emergency Medicine

## 2024-03-11 ENCOUNTER — Encounter (HOSPITAL_BASED_OUTPATIENT_CLINIC_OR_DEPARTMENT_OTHER): Payer: Self-pay | Admitting: *Deleted

## 2024-03-11 ENCOUNTER — Other Ambulatory Visit: Payer: Self-pay

## 2024-03-11 ENCOUNTER — Emergency Department (HOSPITAL_BASED_OUTPATIENT_CLINIC_OR_DEPARTMENT_OTHER): Admitting: Radiology

## 2024-03-11 ENCOUNTER — Emergency Department (HOSPITAL_BASED_OUTPATIENT_CLINIC_OR_DEPARTMENT_OTHER)

## 2024-03-11 DIAGNOSIS — S62645A Nondisplaced fracture of proximal phalanx of left ring finger, initial encounter for closed fracture: Secondary | ICD-10-CM | POA: Insufficient documentation

## 2024-03-11 DIAGNOSIS — R519 Headache, unspecified: Secondary | ICD-10-CM | POA: Insufficient documentation

## 2024-03-11 DIAGNOSIS — E039 Hypothyroidism, unspecified: Secondary | ICD-10-CM | POA: Diagnosis not present

## 2024-03-11 DIAGNOSIS — Z91010 Allergy to peanuts: Secondary | ICD-10-CM | POA: Diagnosis not present

## 2024-03-11 DIAGNOSIS — W19XXXA Unspecified fall, initial encounter: Secondary | ICD-10-CM

## 2024-03-11 DIAGNOSIS — S6990XA Unspecified injury of unspecified wrist, hand and finger(s), initial encounter: Secondary | ICD-10-CM | POA: Diagnosis present

## 2024-03-11 DIAGNOSIS — W010XXA Fall on same level from slipping, tripping and stumbling without subsequent striking against object, initial encounter: Secondary | ICD-10-CM | POA: Diagnosis not present

## 2024-03-11 DIAGNOSIS — J449 Chronic obstructive pulmonary disease, unspecified: Secondary | ICD-10-CM | POA: Insufficient documentation

## 2024-03-11 NOTE — ED Notes (Signed)
 Pt hit left elbow while in lobby. Small 1 inch skin tear with small amount of bleeding noted. Wound cleaned and dressed with nonstick and coban dressing.

## 2024-03-11 NOTE — ED Triage Notes (Signed)
 Patient to ED POV after a mechanical fall 2 days ago. Patient fell from standing and hit head behind right ear, on a table. No loss of consciousness, no headache or head pain reported but blood noted on pillowcase after the fall. Patient taking aspirin    Swelling and bruising to left hand.

## 2024-03-11 NOTE — ED Notes (Signed)
 DC paperwork given and verbally understood.

## 2024-03-11 NOTE — Discharge Instructions (Addendum)
 CT imaging of your head and cervical spine appeared normal.  X-ray of your left hand showed fracture at the base of your left ring finger.  Wear splint until follow-up with orthopedics.  May take Tylenol  for pain.  Attached number for hand specialist to call to schedule appointment.

## 2024-03-11 NOTE — ED Provider Notes (Signed)
 [facility] AT [facility] Provider Note   CSN: [id number] Arrival date & time: 03/11/24  1325     Patient presents with: Jason Barton is a 89 y.o. male.    Fall   89 year old male presents emergency department after fall.  Fall occurred on Saturday.  Patient lives in [facility] facility.  States that he was walking in his room when he tripped on the area rug.  States that he fell backwards hit his head on the ground.  States that he braced his fall with his left hand and has had continued left ring finger pain ever since then.  Denies any LOC.  States he takes a daily aspirin  but no anticoagulation.  Denies any chest pain, abdominal pain, back/neck pain, pain elsewhere in upper or lower extremities.  Patient was able to get up from the ground unassisted.  States that he just told his daughter today who brought him to the emergency department.  Reports continued left ring finger pain which she is most concerned about.  Past medical history significant for COPD, hypothyroidism, scoliosis, hyperlipidemia  Prior to Admission medications   Medication Sig Start Date End Date Taking? Authorizing Provider  alfuzosin  (UROXATRAL ) 10 MG 24 hr tablet Take 1 tablet (10 mg total) by mouth daily with breakfast. 02/05/20   Joshua Debby CROME, MD  alfuzosin  (UROXATRAL ) 10 MG 24 hr tablet Take 10 mg by mouth as needed (prostate).    [provider]  Ascorbic Acid (VITAMIN C WITH ROSE HIPS) 500 MG tablet Take 500 mg by mouth daily.    [provider]  ASPIRIN  LOW DOSE 81 MG tablet TAKE 1 TABLET BY MOUTH DAILY. SWALLOW WHOLE. 03/06/24   Norleen Agent ORN, MD  Cholecalciferol (VITAMIN D PO) Take 5,000 Units by mouth every morning.    [provider]  guaiFENesin -dextromethorphan (ROBITUSSIN DM) 100-10 MG/5ML syrup Take 5 mLs by mouth every 4 (four) hours as needed for cough. 01/08/23   Leotis Bogus, MD  levothyroxine  (SYNTHROID ) 50 MCG  tablet TAKE 1 TABLET BY MOUTH EVERY DAY BEFORE BREAKFAST 09/11/23   Norleen Agent ORN, MD  polyethylene glycol (MIRALAX  / GLYCOLAX ) 17 g packet Take 17 g by mouth daily.    [provider]  rosuvastatin  (CRESTOR ) 10 MG tablet TAKE 1 TABLET BY MOUTH EVERY DAY 09/28/23   Norleen Agent ORN, MD  selenium 50 MCG TABS tablet Take 50 mcg by mouth daily.    [provider]  thiamine  50 MG tablet Take 1 tablet (50 mg total) by mouth daily. 02/14/20   Joshua Debby CROME, MD  Zinc 50 MG TABS Take by mouth.    [provider]    Allergies: Augmentin  [amoxicillin -pot clavulanate] and Peanut-containing drug products    Review of Systems  All other systems reviewed and are negative.   Updated Vital Signs BP (!) 163/99 (BP Location: Right Arm)   Pulse (!) 57   Temp 97.9 F (36.6 C) (Oral)   Resp 20   SpO2 100%   Physical Exam Vitals and nursing note reviewed.  Constitutional:      General: He is not in acute distress.    Appearance: He is well-developed.  HENT:     Head: Normocephalic and atraumatic.     Comments: No obvious swelling or tenderness patient's left occipital region where head injury was said to occur. Eyes:     Conjunctiva/sclera: Conjunctivae normal.  Cardiovascular:     Rate and Rhythm:  Normal rate and regular rhythm.     Heart sounds: No murmur heard. Pulmonary:     Effort: Pulmonary effort is normal. No respiratory distress.     Breath sounds: Normal breath sounds.  Abdominal:     Palpations: Abdomen is soft.     Tenderness: There is no abdominal tenderness.  Musculoskeletal:        General: No swelling.     Cervical back: Neck supple.     Comments: No midline tenderness cervical, thoracic, lumbar spine without step-off or deformity.  No chest wall tenderness.  Patient with tenderness left proximal phalanx of ring finger of left hand.  Able to range affected digit fully.  No other bony tenderness bilateral upper extremities.  Skin:    General: Skin is warm  and dry.     Capillary Refill: Capillary refill takes less than 2 seconds.  Neurological:     Mental Status: He is alert.     Comments: Alert and oriented to self, place, time and event.   Speech is fluent, clear without dysarthria or dysphasia.   Strength symmetric in upper/lower extremities   Sensation intact in upper/lower extremities   Ambulates independently without device assistance. CN I not tested  CN II not tested CN III, IV, VI PERRLA and EOMs intact bilaterally  CN V Intact sensation to sharp and light touch to the face  CN VII facial movements symmetric  CN VIII not tested  CN IX, X no uvula deviation, symmetric rise of soft palate  CN XI symmetric SCM and trapezius strength bilaterally  CN XII Midline tongue protrusion, symmetric L/R movements     Psychiatric:        Mood and Affect: Mood normal.     (all labs ordered are listed, but only abnormal results are displayed) Labs Reviewed - No data to display  EKG: None  Radiology: DG Hand Complete Left Result Date: 03/11/2024 CLINICAL DATA:  Fall, swelling, pain. EXAM: LEFT HAND - COMPLETE 3+ VIEW COMPARISON:  None Available. FINDINGS: Severe osteopenia. Small nondisplaced fracture along the lateral base of the fourth metacarpal. Joint space narrowing, subchondral sclerosis and bony overgrowth at the first carpometacarpal joint with lateral subluxation of the first metacarpal. IMPRESSION: 1. Small nondisplaced fracture along the lateral base of the fourth metacarpal. 2. First CMC joint osteoarthritis. 3. Severe osteopenia. Electronically Signed   By: Newell Eke M.D.   On: 03/11/2024 14:17   CT Head Wo Contrast Result Date: 03/11/2024 CLINICAL DATA:  Provided history: Head trauma, minor. Mechanical fall. EXAM: CT HEAD WITHOUT CONTRAST TECHNIQUE: Contiguous axial images were obtained from the base of the skull through the vertex without intravenous contrast. RADIATION DOSE REDUCTION: This exam was performed  according to the departmental dose-optimization program which includes automated exposure control, adjustment of the mA and/or kV according to patient size and/or use of iterative reconstruction technique. COMPARISON:  Head CT 01/04/2023. FINDINGS: Brain: Moderate generalized cerebral atrophy. Patchy and ill-defined hypoattenuation within the cerebral white matter, nonspecific but compatible with mild chronic small vessel ischemic disease. There is no acute intracranial hemorrhage. No demarcated cortical infarct. No extra-axial fluid collection. No evidence of an intracranial mass. No midline shift. Vascular: No hyperdense vessel.  Atherosclerotic calcifications. Skull: No calvarial fracture or aggressive osseous lesion. Sinuses/Orbits: No mass or acute finding within the imaged orbits. Minimal mucosal thickening within the right maxillary sinus. Other: Small-volume fluid within left mastoid air cells. IMPRESSION: 1.  No evidence of an acute intracranial abnormality. 2. Parenchymal atrophy and  chronic small vessel ischemic disease. 3. Small-volume fluid within left mastoid air cells. Electronically Signed   By: Rockey Childs D.O.   On: 03/11/2024 14:12   CT Cervical Spine Wo Contrast Result Date: 03/11/2024 CLINICAL DATA:  Mechanical fall 2 days ago.  Neck pain. EXAM: CT CERVICAL SPINE WITHOUT CONTRAST TECHNIQUE: Multidetector CT imaging of the cervical spine was performed without intravenous contrast. Multiplanar CT image reconstructions were also generated. RADIATION DOSE REDUCTION: This exam was performed according to the departmental dose-optimization program which includes automated exposure control, adjustment of the mA and/or kV according to patient size and/or use of iterative reconstruction technique. COMPARISON:  CT cervical spine 01/04/2023. FINDINGS: Alignment: Exaggerated cervical lordosis with mild convex left cervicothoracic scoliosis, similar to previous study. Mild stepwise retrolisthesis at C3-4  and C4-5. Skull base and vertebrae: No evidence of acute cervical spine fracture or traumatic subluxation. Soft tissues and spinal canal: No prevertebral fluid or swelling. No visible canal hematoma. Disc levels: Multilevel cervical spondylosis, most advanced at C3-4 and C4-5, similar to previous study. No large disc herniation identified. Upper chest: Stable chronic partially calcified subpleural scarring in both lung apices. Other: Bilateral carotid atherosclerosis. IMPRESSION: 1. No evidence of acute cervical spine fracture, traumatic subluxation or static signs of instability. 2. Stable multilevel cervical spondylosis. Electronically Signed   By: Elsie Perone M.D.   On: 03/11/2024 14:11     Procedures   Medications Ordered in the ED - No data to display                                  Medical Decision Making Amount and/or Complexity of Data Reviewed Radiology: ordered.   This patient presents to the ED for concern of fall, this involves an extensive number of treatment options, and is a complaint that carries with it a high risk of complications and morbidity.  The differential diagnosis includes CVA, fracture, strain/pain, dislocation, ligament substance injury, neurovasc compromise, other   Co morbidities that complicate the patient evaluation  See HPI   Additional history obtained:  Additional history obtained from EMR External records from outside source obtained and reviewed including hospital records   Lab Tests:  N/a   Imaging Studies ordered:  I ordered imaging studies including CT head/cervical spine, left hand x-ray I independently visualized and interpreted imaging which showed  CT head/cervical spine: No acute intracranial abnormality.  Parenchymal atrophy and chronic small vessel ischemic disease.  Small volume fluid within left mastoid air cells.  No acute fracture or traumatic subluxation of the cervical spine.  Multilevel cervical spondylosis. Left hand  x-ray: Small nondisplaced fracture lateral base of fourth proximal phalanx.  CMC joint osteoarthritis.  Severe osteopenia. Radiologist mislabled fracture.   Cardiac Monitoring: / EKG:  N/a   Consultations Obtained:  N/a   Problem List / ED Course / Critical interventions / Medication management  Fall, fourth proximal phalanx fracture left hand Reevaluation of the patient after these medicines showed that the patient stayed the same I have reviewed the patients home medicines and have made adjustments as needed   Social Determinants of Health:  Denies tobacco, licit drug use.   Test / Admission - Considered:  Fall, fourth proximal phalanx fracture left hand Vitals signs significant for hypertension. Otherwise within normal range and stable throughout visit. Imaging studies significant for: See above 89 year old male presents emergency department after fall.  Fall occurred on Saturday.  Patient lives in  [facility] assisted [facility] facility.  States that he was walking in his room when he tripped on the area rug.  States that he fell backwards hit his head on the ground.  States that he braced his fall with his left hand and has had continued left ring finger pain ever since then.  Denies any LOC.  States he takes a daily aspirin  but no anticoagulation.  Denies any chest pain, abdominal pain, back/neck pain, pain elsewhere in upper or lower extremities.  Patient was able to get up from the ground unassisted.  States that he just told his daughter today who brought him to the emergency department.  Reports continued left ring finger pain which she is most concerned about. On exam, nonfocal neurologic exam from baseline.  Patient with tenderness proximal phalanx left ring finger with full range of motion of affected digit.  No obvious reproducible tenderness otherwise on exam.  CT head and cervical spine imaging negative for any acute traumatic injury.  X-ray of left hand concerning for base of  fourth proximal phalanx fracture of hand.  Patient placed in ulnar gutter splint by nursing staff.  Recommend symptomatic therapy and follow-up with hand specialist in the outpatient setting.  Patient and daughter educated regarding altering home environment to decrease likelihood of falls as patient is tripped over area rugs multiple times before the fall on Saturday.  Will recommend follow-up with primary care otherwise for reassessment.  Treatment plan discussed with patient and daughter and they acknowledged and was agreeable.  Patient well-appearing, afebrile in no acute distress upon discharge. Worrisome signs and symptoms were discussed with the patient, and the patient acknowledged understanding to return to the ED if noticed. Patient was stable upon discharge.       Final diagnoses:  None    ED Discharge Orders     None          Silver Wonda LABOR, [place] 03/11/24 1524    Yolande Lamar BROCKS, MD 03/12/24 [phone]

## 2024-04-18 ENCOUNTER — Emergency Department (HOSPITAL_COMMUNITY): Admission: EM | Admit: 2024-04-18 | Discharge: 2024-04-20 | Disposition: E

## 2024-04-18 DIAGNOSIS — I469 Cardiac arrest, cause unspecified: Secondary | ICD-10-CM | POA: Diagnosis present

## 2024-04-18 MED ORDER — GLYCOPYRROLATE 0.2 MG/ML IJ SOLN: 0.2000 mg | INTRAMUSCULAR | Status: DC | PRN

## 2024-04-18 MED ORDER — ONDANSETRON HCL 4 MG/2ML IJ SOLN: 4.0000 mg | Freq: Four times a day (QID) | INTRAMUSCULAR | Status: DC | PRN

## 2024-04-18 MED ORDER — ONDANSETRON 4 MG PO TBDP: 4.0000 mg | ORAL_TABLET | Freq: Four times a day (QID) | ORAL | Status: DC | PRN

## 2024-04-18 MED ORDER — EPINEPHRINE 1 MG/10ML IV SOSY
PREFILLED_SYRINGE | INTRAVENOUS | Status: AC | PRN
  Administered 2024-04-18 (×5): 1 mg via INTRAVENOUS

## 2024-04-18 MED ORDER — SODIUM BICARBONATE 8.4 % IV SOLN
INTRAVENOUS | Status: AC | PRN
  Administered 2024-04-18 (×2): 50 meq via INTRAVENOUS

## 2024-04-18 MED ORDER — SODIUM CHLORIDE 0.9 % IV SOLN: INTRAVENOUS | Status: DC

## 2024-04-18 MED ORDER — POLYVINYL ALCOHOL 1.4 % OP SOLN: 1.0000 [drp] | Freq: Four times a day (QID) | OPHTHALMIC | Status: DC | PRN

## 2024-04-18 MED ORDER — MORPHINE SULFATE (PF) 2 MG/ML IV SOLN: 2.0000 mg | INTRAVENOUS | Status: DC | PRN

## 2024-04-18 MED ORDER — GLYCOPYRROLATE 1 MG PO TABS: 1.0000 mg | ORAL_TABLET | ORAL | Status: DC | PRN

## 2024-04-20 NOTE — Code Documentation (Signed)
 Family updated as to patient's status, family at bedside during CPR.

## 2024-04-20 NOTE — Progress Notes (Signed)
 This chaplain was paged to support family in the setting of Pt. death. The Pt. two daughters are at the bedside, along with the Pt. son in law.   The chaplain provided grief care with the family. Questions were answered about choosing and communicating their choice of funeral home. The phone number for Patient Placement was shared with the family because the family is undecided about a funeral home.. The chaplain listened reflectively as the family discussed the possibility of additional family visiting. The chaplain understands the family is expecting two additional visitors and are open to changing rooms. The chaplain provided soda and water for the family.and updated the on call chaplain.  Chaplain Leeroy Hummer [phone]

## 2024-04-20 NOTE — ED Triage Notes (Signed)
 Patient arrives from [facility] as post-CPR, being paced by EMS on arrival. When moved to ED bed, patient noted to have no pulse, CPR started. EDP Lemly at bedside.

## 2024-04-20 NOTE — Code Documentation (Signed)
 Jason Barton applied

## 2024-04-20 NOTE — Code Documentation (Signed)
 Levo started at 40mcg per Dr Simon

## 2024-04-20 NOTE — Code Documentation (Signed)
 Dr Simon assessing cardiac activity via US , minimal activity noted. RN Darice Rouleau noted pupils are 4mm, nonreactive at this time.

## 2024-04-20 NOTE — ED Notes (Signed)
 ME paged per verbal order of Dr Simon

## 2024-04-20 NOTE — ED Provider Notes (Signed)
 [place] [facility] DEPARTMENT AT [facility] Provider Note   CSN: [id number] Arrival date & time: 2024-04-19  1547     Patient presents with: Cardiac Arrest   Jason Barton is a 89 y.o. male.   HPI  \ Patient presents unresponsive.  Patient became unresponsive while at facility.  EMS arrived.  Started CPR.  Received 15 minutes of CPR intubated with 7.0 ET tube.  Subsequently was able to obtain ROSC.  Patient started having bradycardia away to the hospital so EMS started pacing him.   Per family, no cardiac history.  Patient's heart rate has been very low here recently.     Prior to Admission medications   Medication Sig Start Date End Date Taking? Authorizing Provider  alfuzosin  (UROXATRAL ) 10 MG 24 hr tablet Take 1 tablet (10 mg total) by mouth daily with breakfast. 02/05/20   Joshua Debby CROME, MD  alfuzosin  (UROXATRAL ) 10 MG 24 hr tablet Take 10 mg by mouth as needed (prostate).    [provider]  Ascorbic Acid (VITAMIN C WITH ROSE HIPS) 500 MG tablet Take 500 mg by mouth daily.    [provider]  ASPIRIN  LOW DOSE 81 MG tablet TAKE 1 TABLET BY MOUTH DAILY. SWALLOW WHOLE. 03/06/24   Norleen Lynwood ORN, MD  Cholecalciferol (VITAMIN D PO) Take 5,000 Units by mouth every morning.    [provider]  guaiFENesin -dextromethorphan (ROBITUSSIN DM) 100-10 MG/5ML syrup Take 5 mLs by mouth every 4 (four) hours as needed for cough. 01/08/23   Leotis Bogus, MD  levothyroxine  (SYNTHROID ) 50 MCG tablet TAKE 1 TABLET BY MOUTH EVERY DAY BEFORE BREAKFAST 09/11/23   Norleen Lynwood ORN, MD  polyethylene glycol (MIRALAX  / GLYCOLAX ) 17 g packet Take 17 g by mouth daily.    [provider]  rosuvastatin  (CRESTOR ) 10 MG tablet TAKE 1 TABLET BY MOUTH EVERY DAY 09/28/23   Norleen Lynwood ORN, MD  selenium 50 MCG TABS tablet Take 50 mcg by mouth daily.    [provider]  thiamine  50 MG tablet Take 1 tablet (50 mg total) by mouth daily. 02/14/20   Joshua Debby CROME, MD  Zinc 50 MG TABS Take by mouth.    [provider]    Allergies: Augmentin  [amoxicillin -pot clavulanate] and Peanut-containing drug products    Review of Systems  Unable to perform ROS: Acuity of condition    Updated Vital Signs BP (!) 158/56   Pulse (!) 101   Resp 15   SpO2 99%   Physical Exam Constitutional:      General: He is in acute distress.     Appearance: He is ill-appearing.  Eyes:     Comments: Fixed and dilated pupils   Cardiovascular:     Comments: No pulse. PEA activity     (all labs ordered are listed, but only abnormal results are displayed) Labs Reviewed - No data to display  EKG: None  Radiology: No results found.   Procedures   Medications Ordered in the ED  0.9 %  sodium chloride  infusion (has no administration in time range)  glycopyrrolate  (ROBINUL ) tablet 1 mg (has no administration in time range)    Or  glycopyrrolate  (ROBINUL ) injection 0.2 mg (has no administration in time range)    Or  glycopyrrolate  (ROBINUL ) injection 0.2 mg (has no administration in time range)  artificial tears ophthalmic solution 1 drop (has no administration in time range)  ondansetron  (ZOFRAN -ODT) disintegrating tablet 4 mg (has no administration in time range)  Or  ondansetron  (ZOFRAN ) injection 4 mg (has no administration in time range)  morphine (PF) 2 MG/ML injection 2-4 mg (has no administration in time range)  EPINEPHrine (ADRENALIN) 1 MG/10ML injection (1 mg Intravenous Given October 14, 2023 1604)  sodium bicarbonate injection (50 mEq Intravenous Given 2024-04-23 1555)                                    Medical Decision Making Risk OTC drugs. Prescription drug management.     HPI:   Patient presents unresponsive.  Patient became unresponsive while at facility.  EMS arrived.  Started CPR.  Received 15 minutes of CPR intubated with 7.0 ET tube.  Subsequently was able to obtain ROSC.  Patient started having bradycardia away to the  hospital so EMS started pacing him.   Per family, no cardiac history.  Patient's heart rate has been very low here recently.  MDM:    Upon exam, patient unresponsive.  Fixed and dilated pupils.  No pulse upon initial pulse check.  Started CPR here in ED.  Patient had IO as well as IV access.  Post multiple rounds of epinephrine as well as 2 rounds of sodium bicarb.  At total of 5 epinephrine were given.  Throughout this, patient had no ROSC.  Lung sounds positive bilaterally with bagging..  Bedside echo showed no significant cardiac activity.  Look like he was in PEA arrest based off telemetry.  No evidence of trauma to the head that I could see.  No evidence of trauma to the chest or abdomen.   Of note, the documentation of the pulse rate as well as the blood pressure was obtained whenever we were doing CPR.  This was not obtained during pulse checks.  He had no blood pressure or pulse on pulse checks.   Both of his daughters came to bedside.  Discussed his presentation as well as his poor prognosis.  They came into the room and elected to go comfort care and to stop all CPR and resuscitative measures.   Spoke to [employer], [employer] . They will decline...  Agreed this is likely natural death.      Cardiac Tele Interpreted by Me: PEA   CRITICAL CARE Performed by: Lavonia LOISE Pat   Total critical care time: 45 minutes  Critical care time was exclusive of separately billable procedures and treating other patients.  Critical care was necessary to treat or prevent imminent or life-threatening deterioration.  Critical care was time spent personally by me on the following activities: development of treatment plan with patient and/or surrogate as well as nursing, discussions with consultants, evaluation of patient's response to treatment, examination of patient, obtaining history from patient or surrogate, ordering and performing treatments and interventions, ordering and review of laboratory  studies, ordering and review of radiographic studies, pulse oximetry and re-evaluation of patient's condition.      Final diagnoses:  Cardiac arrest [facility])  Pulseless electrical activity [facility])    ED Discharge Orders     None          Pat Lavonia LOISE, MD 04/23/24 2345

## 2024-04-20 NOTE — Progress Notes (Signed)
   05/14/24 1707  Spiritual Encounters  Type of Visit Follow up  Care provided to: [facility] partners present during encounter Nurse  Reason for visit Patient death  OnCall Visit Yes   F/U from prior chaplain as more family arrived. Met with family and provided addition spiritual care to family. Provided companionate presence to daughter trying to determine how to tell the wife and grandchildren. Also advised of [facility]'s grief counseling. Family will notify [facility] once they locate paperwork for funeral.

## 2024-04-20 NOTE — Code Documentation (Signed)
 Patient in PEA, doc assessing via US  for cardiac activity

## 2024-04-20 DEATH — deceased

## 2024-07-12 ENCOUNTER — Ambulatory Visit: Admitting: Internal Medicine
